# Patient Record
Sex: Male | Born: 1965 | Race: White | Hispanic: No | Marital: Married | Smoking: Never smoker
Health system: Southern US, Community
[De-identification: ages and names within clinical notes are randomized; demographics above are authoritative.]

## PROBLEM LIST (undated history)

## (undated) DIAGNOSIS — R002 Palpitations: Secondary | ICD-10-CM

## (undated) DIAGNOSIS — G4730 Sleep apnea, unspecified: Secondary | ICD-10-CM

## (undated) DIAGNOSIS — E785 Hyperlipidemia, unspecified: Secondary | ICD-10-CM

## (undated) DIAGNOSIS — F329 Major depressive disorder, single episode, unspecified: Secondary | ICD-10-CM

## (undated) DIAGNOSIS — T7840XA Allergy, unspecified, initial encounter: Secondary | ICD-10-CM

## (undated) DIAGNOSIS — N419 Inflammatory disease of prostate, unspecified: Secondary | ICD-10-CM

## (undated) DIAGNOSIS — F419 Anxiety disorder, unspecified: Secondary | ICD-10-CM

## (undated) DIAGNOSIS — F32A Depression, unspecified: Secondary | ICD-10-CM

## (undated) DIAGNOSIS — K219 Gastro-esophageal reflux disease without esophagitis: Secondary | ICD-10-CM

## (undated) HISTORY — DX: Major depressive disorder, single episode, unspecified: F32.9

## (undated) HISTORY — DX: Allergy, unspecified, initial encounter: T78.40XA

## (undated) HISTORY — DX: Depression, unspecified: F32.A

## (undated) HISTORY — PX: SHOULDER SURGERY: SHX246

## (undated) HISTORY — PX: SPINE SURGERY: SHX786

## (undated) HISTORY — PX: COLONOSCOPY: SHX174

## (undated) HISTORY — DX: Hyperlipidemia, unspecified: E78.5

## (undated) HISTORY — DX: Palpitations: R00.2

## (undated) HISTORY — DX: Gastro-esophageal reflux disease without esophagitis: K21.9

## (undated) HISTORY — PX: CERVICAL SPINE SURGERY: SHX589

## (undated) HISTORY — DX: Anxiety disorder, unspecified: F41.9

## (undated) HISTORY — PX: TONSILLECTOMY: SUR1361

## (undated) HISTORY — DX: Sleep apnea, unspecified: G47.30

---

## 2000-09-07 ENCOUNTER — Encounter: Payer: Self-pay | Admitting: Emergency Medicine

## 2000-09-07 ENCOUNTER — Emergency Department (HOSPITAL_COMMUNITY): Admission: EM | Admit: 2000-09-07 | Discharge: 2000-09-07 | Payer: Self-pay | Admitting: Emergency Medicine

## 2004-08-13 ENCOUNTER — Emergency Department (HOSPITAL_COMMUNITY): Admission: EM | Admit: 2004-08-13 | Discharge: 2004-08-13 | Payer: Self-pay | Admitting: Emergency Medicine

## 2007-04-25 ENCOUNTER — Ambulatory Visit (HOSPITAL_COMMUNITY): Admission: RE | Admit: 2007-04-25 | Discharge: 2007-04-25 | Payer: Self-pay | Admitting: Family Medicine

## 2008-02-04 ENCOUNTER — Other Ambulatory Visit: Admission: RE | Admit: 2008-02-04 | Discharge: 2008-02-04 | Payer: Self-pay | Admitting: Urology

## 2008-02-04 ENCOUNTER — Encounter (INDEPENDENT_AMBULATORY_CARE_PROVIDER_SITE_OTHER): Payer: Self-pay | Admitting: Urology

## 2008-02-08 ENCOUNTER — Ambulatory Visit (HOSPITAL_COMMUNITY): Admission: RE | Admit: 2008-02-08 | Discharge: 2008-02-08 | Payer: Self-pay | Admitting: Family Medicine

## 2008-07-17 ENCOUNTER — Ambulatory Visit: Payer: Self-pay | Admitting: Internal Medicine

## 2008-10-27 ENCOUNTER — Ambulatory Visit: Payer: Self-pay | Admitting: Internal Medicine

## 2008-10-27 ENCOUNTER — Ambulatory Visit (HOSPITAL_COMMUNITY): Admission: RE | Admit: 2008-10-27 | Discharge: 2008-10-27 | Payer: Self-pay | Admitting: Internal Medicine

## 2010-06-11 DIAGNOSIS — D229 Melanocytic nevi, unspecified: Secondary | ICD-10-CM

## 2010-06-11 HISTORY — DX: Melanocytic nevi, unspecified: D22.9

## 2011-02-02 ENCOUNTER — Ambulatory Visit (HOSPITAL_COMMUNITY): Payer: BC Managed Care – PPO | Attending: Family Medicine

## 2011-02-02 DIAGNOSIS — R079 Chest pain, unspecified: Secondary | ICD-10-CM | POA: Insufficient documentation

## 2011-03-15 NOTE — Consult Note (Signed)
NAME:  Francisco Pena, Francisco Pena                 ACCOUNT NO.:  [id number]   MEDICAL RECORD NO.:  [id number]          PATIENT TYPE:  AMB   LOCATION:  DAY                           FACILITY:  [facility]   PHYSICIAN:  R. Roetta Sessions, M.D. DATE OF BIRTH:  Jun 07, 1966   DATE OF CONSULTATION:  DATE OF DISCHARGE:                                 CONSULTATION   REQUESTING PHYSICIAN:  Donna Bernard, MD   REASON FOR CONSULTATION:  Hematochezia.   HISTORY OF PRESENT ILLNESS:  Francisco Pena is a 45 year old Caucasian male.  He is here for further evaluation of hematochezia.  He is also concerned  over the fact that his twin brother was just diagnosed with multiple  adenomatous colon polyps.  He tells me that over the last year, he has  had intermittent small-volume hematochezia.  He has noticed it after  defecation on the toilet paper a couple of days in a row intermittently  throughout the year.  He denies any abdominal pain, proctalgia, or  pruritus.  He denies any constipation or diarrhea.  He generally has a  soft brown bowel movement every day.  He occasionally has heartburn 2 or  3 days a week for which he takes p.r.n. Prilosec, this does seem to  help.  He denies any nausea, vomiting, dysphagia, odynophagia, anorexia,  weight loss, or early satiety.   PAST MEDICAL AND SURGICAL HISTORY:  Seasonal allergies, status post  tonsillectomy and adenoidectomy.   No known drug allergies.   Family history is positive for a twin brother with multiple adenomatous  polyps.  Mother age 59 with coronary artery disease.  Father committed  suicide at age 20.  He has 3 healthy siblings.   SOCIAL HISTORY:  Francisco Pena is married.  He has a healthy 3- and 7-year-  old.  He is a Runner, broadcasting/film/video at [employer].  He denies any tobacco,  alcohol, or drug use.   REVIEW OF SYSTEMS:  See HPI, otherwise negative.   PHYSICAL EXAMINATION:  VITAL SIGNS:  Weight 200 pounds, height 74  inches, temp 98.1, blood pressure 118/70, pulse  68.  GENERAL:  He is a well-developed, well-nourished Caucasian male in no  acute distress.  HEENT.  Sclerae clear, nonicteric.  Conjunctivae pick.  Oropharynx pink  and moist without any lesions.  NECK:  Supple without mass or thyromegaly.  CHEST:  Heart regular rate and rhythm.  Normal S1 and S2 without  murmurs, clicks, rubs. or gallops.  LUNGS:  Clear to auscultation bilaterally.  ABDOMEN:  Positive bowel sounds x4.  No bruits auscultated.  Soft,  nontender, nondistended without palpable mass or hepatosplenomegaly.  No  rebound, tenderness, or guarding.  EXTREMITIES:  Without clubbing or edema.  SKIN:  Pink, warm, and dry without any rash or jaundice.  RECTAL:  Deferred.   Laboratory studies from back in April 2009 at [facility]  Medicine were normal.  He did have a bilirubin of 1.9 and triglycerides  of 163.  He had a normal CBC and PSA.   IMPRESSION:  Francisco Pena is a 45 year old Caucasian male  with 1-year  history of intermittent small-volume hematochezia.  He also has a twin  brother with multiple adenomatous colon polyps.  He is going to require  further evaluation of his hematochezia to include benign anorectal  source such as hemorrhoids or fissure, diverticular bleeding, or less  likely ischemia, or colorectal polyps or carcinoma.   PLAN:  Colonoscopy with Dr. Jena Gauss in the near future.  Discussed  procedure including risks and benefits, which include, but not limited  to infection, perforation, drug reaction.  She agrees with the plan and  consent will be obtained.   Thank you Dr. Gerda Diss for allowing [place] to participate in the care of Mr.  Pena.      Lorenza Burton, N.P.      Jonathon Bellows, M.D.  Electronically Signed    KJ/MEDQ  D:  07/17/2008  T:  07/18/2008  Job:  [id number]   cc:   Donna Bernard, M.D.  Fax: [phone]

## 2011-03-15 NOTE — Op Note (Signed)
NAME:  WESTON, KALLMAN                 ACCOUNT NO.:  [id number]   MEDICAL RECORD NO.:  [id number]          PATIENT TYPE:  AMB   LOCATION:  DAY                           FACILITY:  [facility]   PHYSICIAN:  R. Roetta Sessions, M.D. DATE OF BIRTH:  1965/11/18   DATE OF PROCEDURE:  10/27/2008  DATE OF DISCHARGE:                               OPERATIVE REPORT   INDICATIONS FOR PROCEDURE:  A 45 year old Caucasian male with a twin  brother with colonic polyps.  He has had intermittent pain from  hematochezia for sometime.  No family history of colon cancer.  He has  never had his lower GI tract evaluated.  He has one bowel movement  daily.  Denies constipation or straining.  Colonoscopy is now being  done.  Risks, benefits, alternatives, and limitations have been  reviewed.  Questions answered, and he is agreeable.  Please see the  documentation in the medical record.   PROCEDURE NOTE:  O2 saturation, blood pressure, and pulse of the patient  monitored throughout the entire procedure.   CONSCIOUS SEDATION:  Versed 5 mg IV and Demerol 100 mg IV in divided  doses.   INSTRUMENT:  Pentax video chip system.   FINDINGS:  Digital rectal exam revealed a small external hemorrhoidal  tag.  Otherwise unremarkable.  Endoscopic findings:  The prep was good.  Colon:  Colonic mucosa was surveyed from the rectosigmoid junction  through the left transverse, right colon, appendiceal orifice, ileocecal  valve, and cecum.  These structures were well seen and photographed for  the record.  Terminal ileum was intubated to 10 cm.  From this level,  scope was slowly cautiously withdrawn.  All previously mentioned mucosal  surfaces were again seen.  The colonic mucosa appeared entirely normal  as did terminal ileal mucosa.  The scope was pulled down to the rectum  with examination of rectal mucosa including retroflexion of the anal  verge demonstrated only a single anal papilla.  The patient tolerated  the procedure well  and was reacted in Endoscopy.   IMPRESSION:  Small external hemorrhoidal tag, single anal papilla.  Otherwise, normal rectum, colon, terminal ileum.  I suspect the patient  has blood from hemorrhoids.   RECOMMENDATIONS:  1. Hemorrhoid.  High-fiber diet literature provided to Mr. Schaberg.  2. Ten-day course of Anusol-HC Suppository 1 per rectum at bedtime.  3. He should consider taking daily fiber supplement with Benefiber or      other agent.  4. Given family history of colonic polyps, he ought to return for a      repeat screening colonoscopy in 5 years.  5. If rectal bleeding is not resolved, he is to let us know.      Jonathon Bellows, M.D.  Electronically Signed     RMR/MEDQ  D:  10/27/2008  T:  10/27/2008  Job:  [id number]   cc:   Donna Bernard, M.D.  Fax: [phone]

## 2011-11-14 ENCOUNTER — Other Ambulatory Visit (HOSPITAL_COMMUNITY): Payer: Self-pay | Admitting: Neurological Surgery

## 2011-11-14 DIAGNOSIS — M545 Low back pain: Secondary | ICD-10-CM

## 2011-11-16 ENCOUNTER — Ambulatory Visit (HOSPITAL_COMMUNITY): Payer: BC Managed Care – PPO

## 2011-11-17 ENCOUNTER — Ambulatory Visit (HOSPITAL_COMMUNITY)
Admission: RE | Admit: 2011-11-17 | Discharge: 2011-11-17 | Disposition: A | Discharge status: Home / Self Care | Payer: BC Managed Care – PPO | Source: Ambulatory Visit | Attending: Neurological Surgery | Admitting: Neurological Surgery

## 2011-11-17 DIAGNOSIS — M545 Low back pain, unspecified: Secondary | ICD-10-CM | POA: Insufficient documentation

## 2011-11-17 DIAGNOSIS — M5126 Other intervertebral disc displacement, lumbar region: Secondary | ICD-10-CM | POA: Insufficient documentation

## 2012-11-27 ENCOUNTER — Other Ambulatory Visit: Payer: Self-pay | Admitting: Physician Assistant

## 2013-01-15 ENCOUNTER — Other Ambulatory Visit: Payer: Self-pay | Admitting: Family Medicine

## 2013-02-21 ENCOUNTER — Encounter (HOSPITAL_COMMUNITY): Payer: Self-pay | Admitting: *Deleted

## 2013-02-21 ENCOUNTER — Emergency Department (HOSPITAL_COMMUNITY)
Admission: EM | Admit: 2013-02-21 | Discharge: 2013-02-22 | Disposition: A | Discharge status: Home / Self Care | Payer: BC Managed Care – PPO | Attending: Emergency Medicine | Admitting: Emergency Medicine

## 2013-02-21 DIAGNOSIS — M545 Low back pain, unspecified: Secondary | ICD-10-CM | POA: Insufficient documentation

## 2013-02-21 DIAGNOSIS — R1110 Vomiting, unspecified: Secondary | ICD-10-CM | POA: Insufficient documentation

## 2013-02-21 DIAGNOSIS — Z87448 Personal history of other diseases of urinary system: Secondary | ICD-10-CM | POA: Insufficient documentation

## 2013-02-21 DIAGNOSIS — N200 Calculus of kidney: Secondary | ICD-10-CM | POA: Insufficient documentation

## 2013-02-21 HISTORY — DX: Inflammatory disease of prostate, unspecified: N41.9

## 2013-02-21 NOTE — ED Notes (Signed)
Visitor with pt walked out and stated that pt is in tremendous pain at this time. Pt is pacing in room at this time. Francisco Pena

## 2013-02-21 NOTE — ED Notes (Signed)
Pain rt side of low back Nausea,  Had pain last night and it went away, recurred app 1 hour ago.

## 2013-02-22 ENCOUNTER — Emergency Department (HOSPITAL_COMMUNITY): Payer: BC Managed Care – PPO

## 2013-02-22 LAB — URINE MICROSCOPIC-ADD ON

## 2013-02-22 LAB — CBC WITH DIFFERENTIAL/PLATELET
Basophils Absolute: 0 10*3/uL (ref 0.0–0.1)
HCT: 42.1 % (ref 39.0–52.0)
Lymphocytes Relative: 23 % (ref 12–46)
Neutro Abs: 6.6 10*3/uL (ref 1.7–7.7)
Platelets: 211 10*3/uL (ref 150–400)
RDW: 12.7 % (ref 11.5–15.5)
WBC: 9.7 10*3/uL (ref 4.0–10.5)

## 2013-02-22 LAB — BASIC METABOLIC PANEL
CO2: 29 mEq/L (ref 19–32)
Chloride: 101 mEq/L (ref 96–112)
Sodium: 142 mEq/L (ref 135–145)

## 2013-02-22 LAB — URINALYSIS, ROUTINE W REFLEX MICROSCOPIC
Glucose, UA: NEGATIVE mg/dL
Leukocytes, UA: NEGATIVE
Specific Gravity, Urine: >1.03 — ABNORMAL HIGH (ref 1.005–1.030)

## 2013-02-22 MED ORDER — SODIUM CHLORIDE 0.9 % IV BOLUS (SEPSIS)
1000.0000 mL | Freq: Once | INTRAVENOUS | Status: AC
  Administered 2013-02-22: 1000 mL via INTRAVENOUS

## 2013-02-22 MED ORDER — ONDANSETRON 4 MG PO TBDP: 4.0000 mg | ORAL_TABLET | Freq: Three times a day (TID) | ORAL | Status: DC | PRN

## 2013-02-22 MED ORDER — ONDANSETRON HCL 4 MG/2ML IJ SOLN
4.0000 mg | Freq: Once | INTRAMUSCULAR | Status: AC
  Administered 2013-02-22: 4 mg via INTRAVENOUS
  Filled 2013-02-22: qty 2

## 2013-02-22 MED ORDER — KETOROLAC TROMETHAMINE 30 MG/ML IJ SOLN
30.0000 mg | Freq: Once | INTRAMUSCULAR | Status: AC
  Administered 2013-02-22: 30 mg via INTRAVENOUS
  Filled 2013-02-22: qty 1

## 2013-02-22 MED ORDER — HYDROCODONE-ACETAMINOPHEN 5-325 MG PO TABS: 1.0000 | ORAL_TABLET | ORAL | Status: DC | PRN

## 2013-02-22 MED ORDER — CIPROFLOXACIN HCL 500 MG PO TABS: 500.0000 mg | ORAL_TABLET | Freq: Two times a day (BID) | ORAL | Status: DC

## 2013-02-22 MED ORDER — SODIUM CHLORIDE 0.9 % IV SOLN
Freq: Once | INTRAVENOUS | Status: AC
  Administered 2013-02-22: 02:00:00 via INTRAVENOUS

## 2013-02-22 MED ORDER — HYDROMORPHONE HCL PF 1 MG/ML IJ SOLN
1.0000 mg | Freq: Once | INTRAMUSCULAR | Status: AC
  Administered 2013-02-22: 1 mg via INTRAVENOUS
  Filled 2013-02-22: qty 1

## 2013-02-22 NOTE — ED Notes (Signed)
Gave patient drink as requested and MD approved.

## 2013-02-22 NOTE — ED Provider Notes (Signed)
History     CSN: [id number]  Arrival date & time 02/21/13  2233   First MD Initiated Contact with Patient 02/21/13 2350      Chief Complaint  Patient presents with  . Back Pain    (Consider location/radiation/quality/duration/timing/severity/associated sxs/prior treatment) HPI Francisco Pena is a 47 y.o. male who presents to the Emergency Department complaining of  Right flank pain with radiation to right lower abdomen that began an hour ago. Now worse. Unable to sit still. Vomiting x 1. Denies fever, chills. Has taken no medications.  PCP Dr. Gerda Diss  Past Medical History  Diagnosis Date  . Prostate infection     History reviewed. No pertinent past surgical history.  History reviewed. No pertinent family history.  History  Substance Use Topics  . Smoking status: Never Smoker   . Smokeless tobacco: Not on file  . Alcohol Use: No      Review of Systems  Constitutional: Negative for fever.       10 Systems reviewed and are negative for acute change except as noted in the HPI.  HENT: Negative for congestion.   Eyes: Negative for discharge and redness.  Respiratory: Negative for cough and shortness of breath.   Cardiovascular: Negative for chest pain.  Gastrointestinal: Positive for abdominal pain. Negative for vomiting.  Genitourinary: Positive for flank pain.  Musculoskeletal: Negative for back pain.  Skin: Negative for rash.  Neurological: Negative for syncope, numbness and headaches.  Psychiatric/Behavioral:       No behavior change.    Allergies  Review of patient's allergies indicates no known allergies.  Home Medications   Current Outpatient Rx  Name  Route  Sig  Dispense  Refill  . flintstones complete (FLINTSTONES) 60 MG chewable tablet   Oral   Chew 1 tablet by mouth every morning.         Marland Kitchen HYDROcodone-acetaminophen (NORCO/VICODIN) 5-325 MG per tablet   Oral   Take 1 tablet by mouth every 6 (six) hours as needed for pain (for back pain).        . ciprofloxacin (CIPRO) 500 MG tablet   Oral   Take 1 tablet (500 mg total) by mouth 2 (two) times daily.   14 tablet   0   . HYDROcodone-acetaminophen (NORCO/VICODIN) 5-325 MG per tablet   Oral   Take 1 tablet by mouth every 4 (four) hours as needed for pain.   15 tablet   0   . ondansetron (ZOFRAN ODT) 4 MG disintegrating tablet   Oral   Take 1 tablet (4 mg total) by mouth every 8 (eight) hours as needed for nausea.   20 tablet   0     BP 152/138  Pulse 80  Temp(Src) 97 F (36.1 C) (Oral)  Resp 20  Ht 6\' 2"  (1.88 m)  Wt 200 lb (90.719 kg)  BMI 25.67 kg/m2  SpO2 100%  Physical Exam  Nursing note and vitals reviewed. Constitutional: He appears well-developed and well-nourished.  Awake, alert, nontoxic appearance.  HENT:  Head: Normocephalic and atraumatic.  Right Ear: External ear normal.  Left Ear: External ear normal.  Mouth/Throat: Oropharynx is clear and moist.  Eyes: EOM are normal. Pupils are equal, round, and reactive to light.  Neck: Normal range of motion. Neck supple.  Cardiovascular: Normal rate and intact distal pulses.   Pulmonary/Chest: Effort normal and breath sounds normal. He exhibits no tenderness.  Abdominal: Soft. Bowel sounds are normal. There is no tenderness. There is no rebound.  Genitourinary:  Right cva tenderness  Musculoskeletal: He exhibits no tenderness.  Baseline ROM, no obvious new focal weakness.  Neurological:  Mental status and motor strength appears baseline for patient and situation.  Skin: No rash noted.  Psychiatric: He has a normal mood and affect.    ED Course  Procedures (including critical care time) Results for orders placed during the hospital encounter of 02/21/13  URINALYSIS, ROUTINE W REFLEX MICROSCOPIC      Result Value Range   Color, Urine YELLOW  YELLOW   APPearance CLEAR  CLEAR   Specific Gravity, Urine >1.030 (*) 1.005 - 1.030   pH 6.0  5.0 - 8.0   Glucose, UA NEGATIVE  NEGATIVE mg/dL   Hgb urine  dipstick MODERATE (*) NEGATIVE   Bilirubin Urine NEGATIVE  NEGATIVE   Ketones, ur NEGATIVE  NEGATIVE mg/dL   Protein, ur NEGATIVE  NEGATIVE mg/dL   Urobilinogen, UA 0.2  0.0 - 1.0 mg/dL   Nitrite NEGATIVE  NEGATIVE   Leukocytes, UA NEGATIVE  NEGATIVE  CBC WITH DIFFERENTIAL      Result Value Range   WBC 9.7  4.0 - 10.5 K/uL   RBC 5.02  4.22 - 5.81 MIL/uL   Hemoglobin 14.8  13.0 - 17.0 g/dL   HCT 16.1  09.6 - 04.5 %   MCV 83.9  78.0 - 100.0 fL   MCH 29.5  26.0 - 34.0 pg   MCHC 35.2  30.0 - 36.0 g/dL   RDW 40.9  81.1 - 91.4 %   Platelets 211  150 - 400 K/uL   Neutrophils Relative 68  43 - 77 %   Neutro Abs 6.6  1.7 - 7.7 K/uL   Lymphocytes Relative 23  12 - 46 %   Lymphs Abs 2.2  0.7 - 4.0 K/uL   Monocytes Relative 8  3 - 12 %   Monocytes Absolute 0.8  0.1 - 1.0 K/uL   Eosinophils Relative 2  0 - 5 %   Eosinophils Absolute 0.2  0.0 - 0.7 K/uL   Basophils Relative 0  0 - 1 %   Basophils Absolute 0.0  0.0 - 0.1 K/uL  BASIC METABOLIC PANEL      Result Value Range   Sodium 142  135 - 145 mEq/L   Potassium 3.5  3.5 - 5.1 mEq/L   Chloride 101  96 - 112 mEq/L   CO2 29  19 - 32 mEq/L   Glucose, Bld 124 (*) 70 - 99 mg/dL   BUN 20  6 - 23 mg/dL   Creatinine, Ser 7.82  0.50 - 1.35 mg/dL   Calcium 9.5  8.4 - 95.6 mg/dL   GFR calc non Af Amer 71 (*) >90 mL/min   GFR calc Af Amer 82 (*) >90 mL/min  URINE MICROSCOPIC-ADD ON      Result Value Range   WBC, UA 0-2  <3 WBC/hpf   RBC / HPF 3-6  <3 RBC/hpf   Crystals CA OXALATE CRYSTALS (*) NEGATIVE    Ct Abdomen Pelvis Wo Contrast  02/22/2013  *RADIOLOGY REPORT*  Clinical Data: Right flank pain  CT ABDOMEN AND PELVIS WITHOUT CONTRAST  Technique:  Multidetector CT imaging of the abdomen and pelvis was performed following the standard protocol without intravenous contrast.  Comparison: 11/17/2011 spine MRI  Findings: Limited images through the lung bases demonstrate no significant appreciable abnormality. The heart size is within normal  limits. No pleural or pericardial effusion.  Mild dependent atelectasis.  Organ abnormality/lesion detection is limited  in the absence of intravenous contrast. Within this limitation, unremarkable liver, biliary system, spleen, pancreas, adrenal glands, left kidney.  Right renal edema and perinephric fat stranding.  Mild hydroureteronephrosis to the level of a 3 mm stone at the right UVJ.  No CT evidence for colitis.  Normal appendix.  Small bowel loops are normal course and caliber.  No free intraperitoneal air or fluid.  No lymphadenopathy.  Normal caliber aorta and branch vessels with mild scattered atherosclerotic disease.  Decompressed bladder. Prostate upper normal.  No pelvic lymphadenopathy. Tiny fat containing right inguinal hernia versus spermatic cord lipoma.  No acute osseous finding.  IMPRESSION: Mild right hydroureteronephrosis to the level of a 3 mm UVJ stone.  Right renal perinephric fat stranding may reflect reactive changes, forniceal rupture, or superimposed infection. Correlate with urinalysis.   Original Report Authenticated By: Jearld Lesch, M.D.      1. Kidney stone    Medications  HYDROmorphone (DILAUDID) injection 1 mg (1 mg Intravenous Given 02/22/13 0008)  ondansetron (ZOFRAN) injection 4 mg (4 mg Intravenous Given 02/22/13 0007)  0.9 %  sodium chloride infusion ( Intravenous Stopped 02/22/13 0349)  sodium chloride 0.9 % bolus 1,000 mL (0 mLs Intravenous Stopped 02/22/13 0302)  ketorolac (TORADOL) 30 MG/ML injection 30 mg (30 mg Intravenous Given 02/22/13 0007)  HYDROmorphone (DILAUDID) injection 1 mg (1 mg Intravenous Given 02/22/13 0216)     MDM  Patient with right flank and abdominal pain. CT with 3 mm stone at UVJ. Given dilaudid x 2, zofran, and toradol with relief of the pain. Reviewed results with patient. Referral to urology. Pt stable in ED with no significant deterioration in condition.The patient appears reasonably screened and/or stabilized for discharge and I  doubt any other medical condition or other [facility] requiring further screening, evaluation, or treatment in the ED at this time prior to discharge.  MDM Reviewed: nursing note and vitals Interpretation: labs and CT scan           Nicoletta Dress. Colon Branch, MD 02/22/13 [phone]

## 2013-07-30 ENCOUNTER — Ambulatory Visit (INDEPENDENT_AMBULATORY_CARE_PROVIDER_SITE_OTHER): Payer: BC Managed Care – PPO | Admitting: Family Medicine

## 2013-07-30 ENCOUNTER — Encounter: Payer: Self-pay | Admitting: Family Medicine

## 2013-07-30 VITALS — BP 148/98 | Temp 98.4°F | Ht 74.0 in | Wt 212.4 lb

## 2013-07-30 DIAGNOSIS — J329 Chronic sinusitis, unspecified: Secondary | ICD-10-CM

## 2013-07-30 MED ORDER — AMOXICILLIN-POT CLAVULANATE 875-125 MG PO TABS: 1.0000 | ORAL_TABLET | Freq: Two times a day (BID) | ORAL | Status: AC

## 2013-07-30 NOTE — Progress Notes (Signed)
  Subjective:    Patient ID: Francisco Pena, male    DOB: 04-13-1966, 47 y.o.   MRN: [id number]  Sinusitis This is a new problem. The current episode started in the past 7 days. Associated symptoms include congestion, coughing, ear pain, headaches, sinus pressure, sneezing and a sore throat. Past treatments include acetaminophen. The treatment provided mild relief.   Sat woke up with ear ache and pressure, dragging on sat and sun  Lethargic, minimal energy  Max pressure in face  Ne g bp  Tried zyrtec, off brand tyl  Review of Systems  HENT: Positive for ear pain, congestion, sore throat, sneezing and sinus pressure.   Respiratory: Positive for cough.   Neurological: Positive for headaches.   ROS otherwise negative     Objective:   Physical Exam  Alert blood pressure on repeat 140/82 lungs clear. Heart regular in rhythm. H&T normal. Frontal maxillary tenderness and congestion     Assessment & Plan:  Impression 1 rhinosinusitis #2 borderline elevation blood pressure patient experiencing considerable stress. Plan Augmentin twice a day 10 days. Symptomatic care discussed. WSL

## 2013-10-01 ENCOUNTER — Encounter: Payer: Self-pay | Admitting: Internal Medicine

## 2013-10-30 ENCOUNTER — Encounter: Payer: Self-pay | Admitting: Family Medicine

## 2013-10-30 ENCOUNTER — Ambulatory Visit (INDEPENDENT_AMBULATORY_CARE_PROVIDER_SITE_OTHER): Payer: BC Managed Care – PPO | Admitting: Family Medicine

## 2013-10-30 VITALS — BP 118/74 | Temp 98.5°F | Ht 74.5 in | Wt 208.8 lb

## 2013-10-30 DIAGNOSIS — J329 Chronic sinusitis, unspecified: Secondary | ICD-10-CM

## 2013-10-30 MED ORDER — AMOXICILLIN-POT CLAVULANATE 875-125 MG PO TABS: 1.0000 | ORAL_TABLET | Freq: Two times a day (BID) | ORAL | Status: AC

## 2013-10-30 NOTE — Progress Notes (Signed)
   Subjective:    Patient ID: Francisco Pena, male    DOB: June 09, 1966, 47 y.o.   MRN: [id number]  Sinusitis This is a new problem. The current episode started 1 to 4 weeks ago. The problem has been waxing and waning since onset. There has been no fever. Associated symptoms include congestion, coughing, headaches and sinus pressure. Past treatments include acetaminophen and oral decongestants. The treatment provided mild relief.    Headache frontal in nature. Comes and goes. Radiates towards cheeks and ears. Off-and-on for several weeks.  Possible low-grade fever.  Somewhat diminished energy.  Review of Systems  HENT: Positive for congestion and sinus pressure.   Respiratory: Positive for cough.   Neurological: Positive for headaches.   No vomiting no diarrhea no rash ROS otherwise negative    Objective:   Physical Exam Alert hydration good. Frontal maxillary tenderness. Nasal congestion. Pharynx normal neck supple. Lungs clear heart regular in rhythm.       Assessment & Plan:   impression 1 rhinosinsitis-discussed plan antibiotics prescribed. Symptomatic care discussed. Warning signs discussed. WSL

## 2013-12-25 ENCOUNTER — Other Ambulatory Visit: Payer: Self-pay | Admitting: Family Medicine

## 2013-12-30 ENCOUNTER — Other Ambulatory Visit: Payer: Self-pay | Admitting: Family Medicine

## 2014-03-03 ENCOUNTER — Other Ambulatory Visit: Payer: Self-pay | Admitting: Family Medicine

## 2014-08-28 IMAGING — CT CT ABD-PELV W/O CM
2 of 3 series · 9 of 46 positions shown, 11 images · non-contrast
Comparison: 11/17/2011 spine MRI

CLINICAL DATA: Right flank pain

CT ABDOMEN AND PELVIS WITHOUT CONTRAST
TECHNIQUE: Multidetector CT imaging of the abdomen and pelvis was
performed following the standard protocol without intravenous
contrast.

[Series 4: mpr coronal (id) · coronal · 0.76mm/px · 8 of 102 slices shown, 9 images]
[im 12/102  soft-tissue]
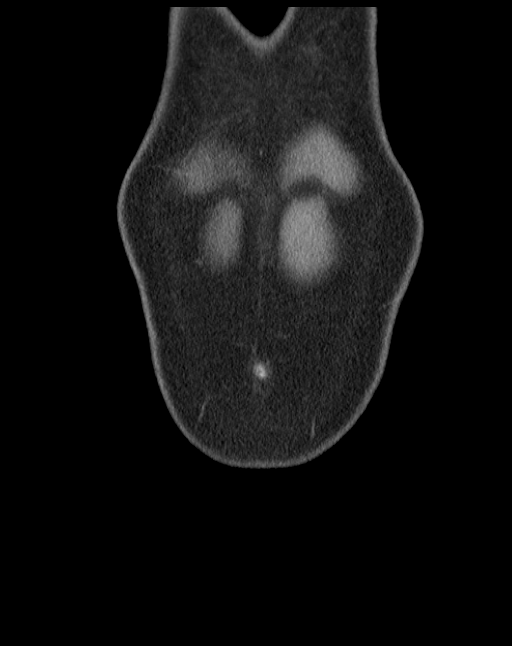
[im 12/102  bone]
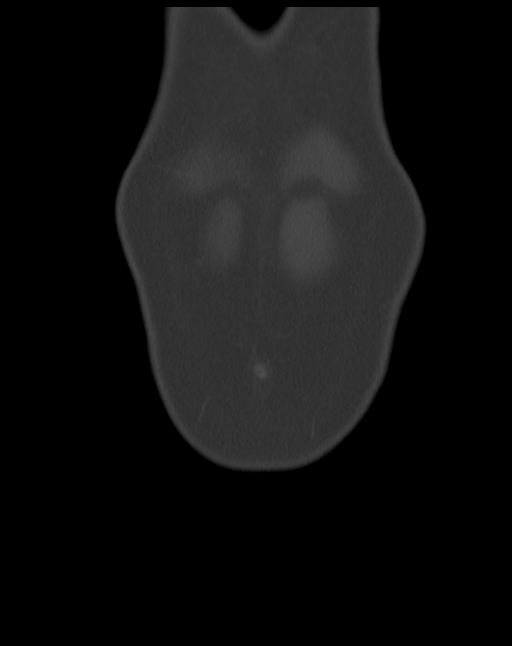
[im 23/102  soft-tissue]
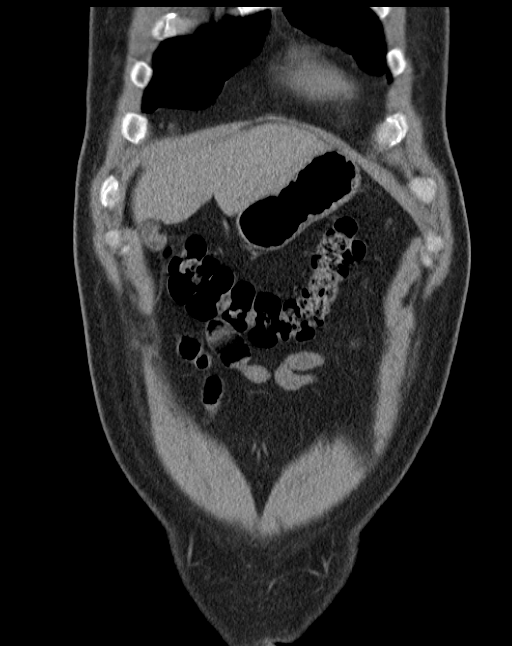
[im 34/102  soft-tissue]
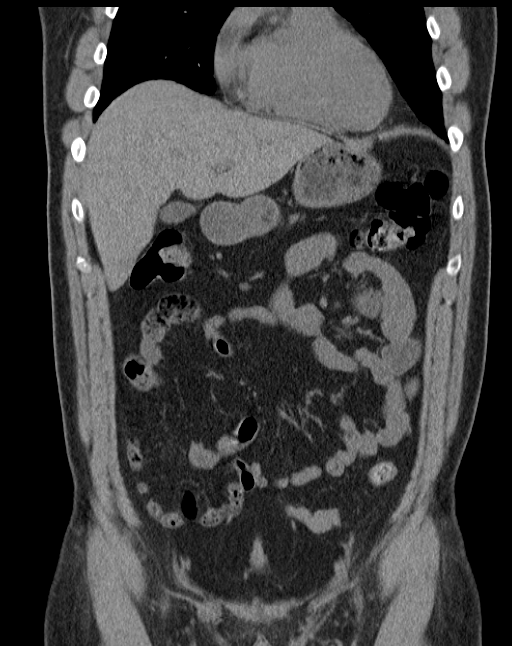
[im 45/102  soft-tissue]
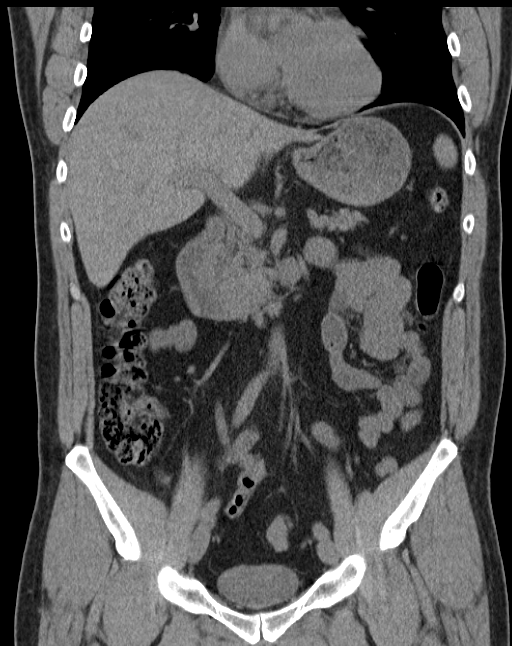
[im 57/102  soft-tissue]
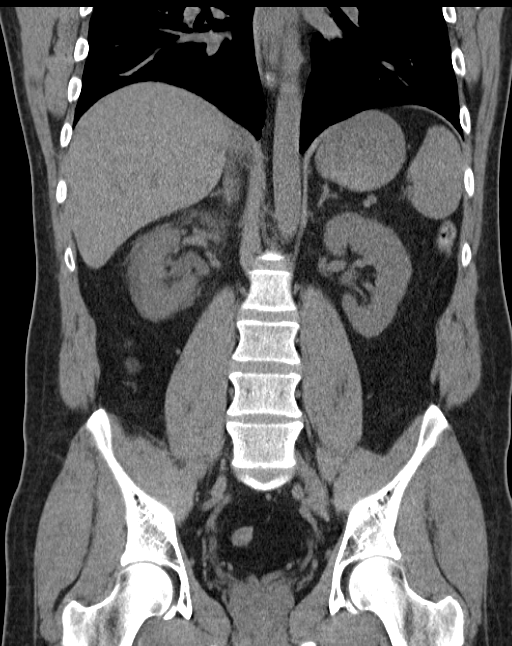
[im 68/102  soft-tissue]
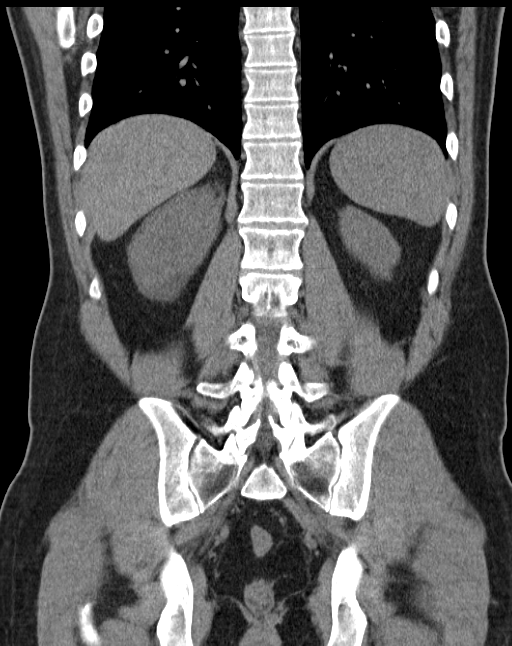
[im 79/102  soft-tissue]
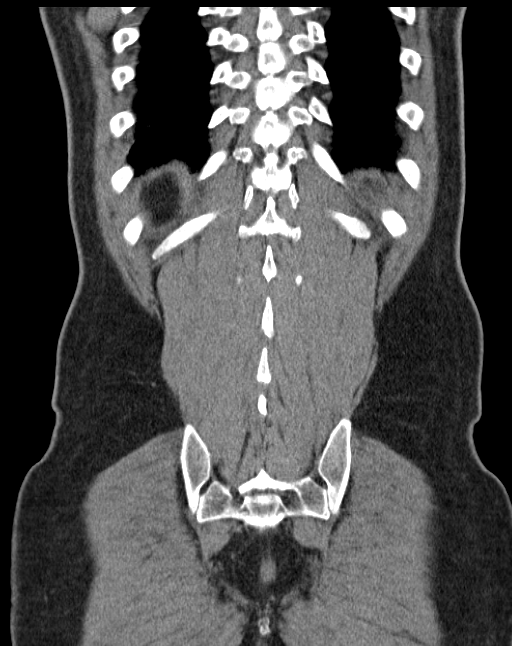
[im 90/102  soft-tissue]
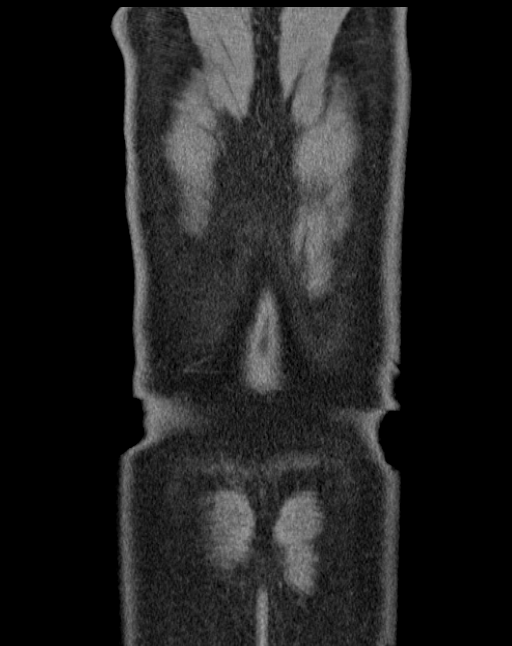

[Series 5: mpr sagittal (id) · sagittal · 0.65mm/px · 1 of 128 slices shown, 2 images]
[im 43/128  soft-tissue]
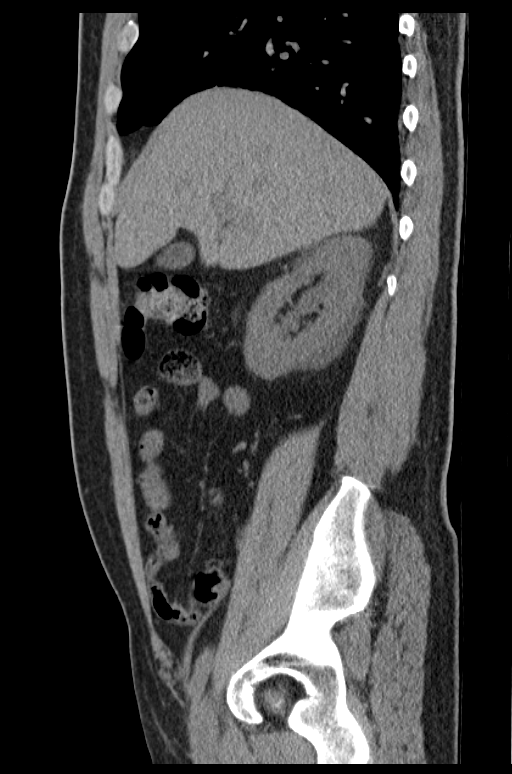
[im 43/128  bone]
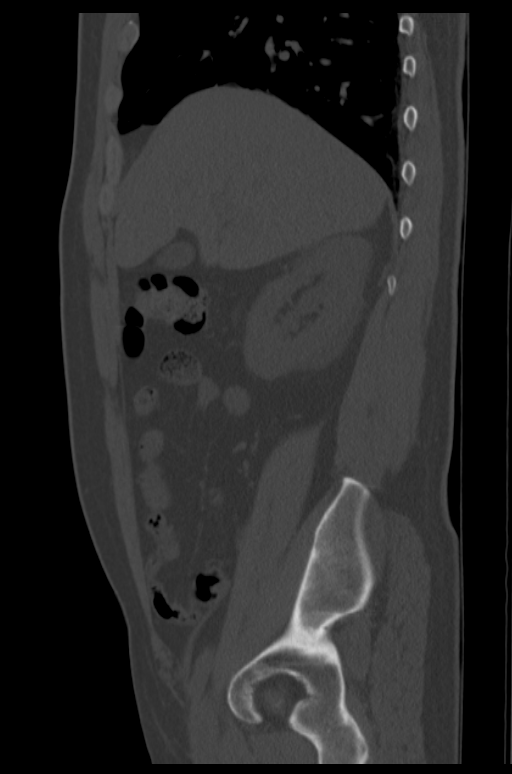

[9 of 46 positions shown; findings below may reference images not displayed]

FINDINGS: Limited images through the lung bases demonstrate no
significant appreciable abnormality. The heart size is within
normal limits. No pleural or pericardial effusion.  Mild dependent
atelectasis.

Organ abnormality/lesion detection is limited in the absence of
intravenous contrast. Within this limitation, unremarkable liver,
biliary system, spleen, pancreas, adrenal glands, left kidney.

Right renal edema and perinephric fat stranding.  Mild
hydroureteronephrosis to the level of a 3 mm stone at the right
UVJ.

No CT evidence for colitis.  Normal appendix.  Small bowel loops
are normal course and caliber.

No free intraperitoneal air or fluid.  No lymphadenopathy.

Normal caliber aorta and branch vessels with mild scattered
atherosclerotic disease.

Decompressed bladder. Prostate upper normal.  No pelvic
lymphadenopathy. Tiny fat containing right inguinal hernia versus
spermatic cord lipoma.

No acute osseous finding.
IMPRESSION: Mild right hydroureteronephrosis to the level of a 3 mm UVJ stone.

Right renal perinephric fat stranding may reflect reactive changes,
forniceal rupture, or superimposed infection. Correlate with
urinalysis.

## 2014-09-02 ENCOUNTER — Other Ambulatory Visit: Payer: Self-pay | Admitting: Family Medicine

## 2014-09-02 NOTE — Telephone Encounter (Signed)
Last 09/2013

## 2014-09-02 NOTE — Telephone Encounter (Signed)
One mo needs ov bef further

## 2014-11-01 ENCOUNTER — Other Ambulatory Visit: Payer: Self-pay | Admitting: Family Medicine

## 2014-11-03 NOTE — Telephone Encounter (Signed)
Last SEEN 09/2013

## 2014-11-03 NOTE — Telephone Encounter (Signed)
30 d only needs appt

## 2014-12-07 ENCOUNTER — Other Ambulatory Visit: Payer: Self-pay | Admitting: Family Medicine

## 2015-01-12 ENCOUNTER — Other Ambulatory Visit: Payer: Self-pay | Admitting: Family Medicine

## 2015-01-14 ENCOUNTER — Telehealth: Payer: Self-pay | Admitting: Family Medicine

## 2015-01-14 NOTE — Telephone Encounter (Signed)
Patient called and left message on my voice mail wanting records sent to a heart doctor in [place] a Dr. Harrington Challenger. But I looked in Epic havent been seen since 10/30/13 for Rhinosinusitis. Would he need to be seen here first so a referral can be done.

## 2015-01-14 NOTE — Telephone Encounter (Signed)
Does he want records only sent? In that case, do. If he wants referral to heart doc needs ov first

## 2015-01-14 NOTE — Telephone Encounter (Signed)
Francisco Pena, call pt and find out exactly what he wants and why, then go ahead and do it

## 2015-01-14 NOTE — Telephone Encounter (Signed)
What records needs to be sent from chart .He has no recent EKG.Please review chart and let me know what to send.

## 2015-01-20 ENCOUNTER — Encounter: Payer: Self-pay | Admitting: *Deleted

## 2015-01-24 ENCOUNTER — Other Ambulatory Visit: Payer: Self-pay | Admitting: Family Medicine

## 2015-02-02 ENCOUNTER — Ambulatory Visit (INDEPENDENT_AMBULATORY_CARE_PROVIDER_SITE_OTHER): Payer: BC Managed Care – PPO | Admitting: Internal Medicine

## 2015-02-02 ENCOUNTER — Encounter (INDEPENDENT_AMBULATORY_CARE_PROVIDER_SITE_OTHER): Payer: BC Managed Care – PPO

## 2015-02-02 ENCOUNTER — Encounter: Payer: Self-pay | Admitting: Internal Medicine

## 2015-02-02 ENCOUNTER — Encounter: Payer: Self-pay | Admitting: *Deleted

## 2015-02-02 VITALS — BP 102/72 | HR 85 | Ht 74.5 in | Wt 205.0 lb

## 2015-02-02 DIAGNOSIS — R002 Palpitations: Secondary | ICD-10-CM

## 2015-02-02 DIAGNOSIS — R0789 Other chest pain: Secondary | ICD-10-CM | POA: Diagnosis not present

## 2015-02-02 NOTE — Patient Instructions (Signed)
Your physician recommends that you continue on your current medications as directed. Please refer to the Current Medication list given to you today. Your physician has requested that you have an echocardiogram. Echocardiography is a painless test that uses sound waves to create images of your heart. It provides your doctor with information about the size and shape of your heart and how well your heart's chambers and valves are working. This procedure takes approximately one hour. There are no restrictions for this procedure.  Your physician has recommended that you wear an event monitor. Event monitors are medical devices that record the heart's electrical activity. Doctors most often [place] these monitors to diagnose arrhythmias. Arrhythmias are problems with the speed or rhythm of the heartbeat. The monitor is a small, portable device. You can wear one while you do your normal daily activities. This is usually used to diagnose what is causing palpitations/syncope (passing out).

## 2015-02-02 NOTE — Progress Notes (Signed)
Cardiology Office Note   Date:  02/02/2015   ID:  Francisco Pena, DOB 02/22/1966, MRN [id number]  PCP:  Rubbie Battiest, MD  Cardiologist:   Dorris Carnes, MD   Chief Complaint  Patient presents with  . Palpitations    Shortness of breath      History of Present Illness: Francisco Pena is a 49 y.o. male who is referred for palpitations.  Spells occur daily   The patient says that he has episodes where her feels his heart start to beat out of his chest.  Skpis.  Occur interimitt.  Some dizziness  Saturday had a spell that lasted 10 min  Associated with dizziness.  Does have some SOB with spells He notes occasional chest pains with and without activity.  This is occasionally pleuritic. Denies edema.   Family history signif for Dad and maternal GM with CHF  Mother and sister have afib    When not having spells he is fairly active  Teaches HS history         Current Outpatient Prescriptions  Medication Sig Dispense Refill  . citalopram (CELEXA) 40 MG tablet TAKE 1 TABLET BY MOUTH EVERY DAY 7 tablet 0  . HYDROcodone-acetaminophen (NORCO/VICODIN) 5-325 MG per tablet Take 1 tablet by mouth every 4 (four) hours as needed for pain. 15 tablet 0  . ondansetron (ZOFRAN ODT) 4 MG disintegrating tablet Take 1 tablet (4 mg total) by mouth every 8 (eight) hours as needed for nausea. 20 tablet 0   No current facility-administered medications for this visit.    Allergies:   Review of patient's allergies indicates no known allergies.   Past Medical History  Diagnosis Date  . Prostate infection   . Allergy     Past Surgical History  Procedure Laterality Date  . Tonsillectomy       Social History:  The patient  reports that he has never smoked. He does not have any smokeless tobacco history on file. He reports that he does not drink alcohol or use illicit drugs.   Family History:  The patient's family history includes Heart disease in his father; Hypertension in his father and mother.     ROS:  Please see the history of present illness. All other systems are reviewed and  Negative to the above problem except as noted.    PHYSICAL EXAM: VS:  BP 102/72 mmHg  Pulse 85  Ht 6' 2.5" (1.892 m)  Wt 205 lb (92.987 kg)  BMI 25.98 kg/m2  SpO2 97%  GEN: Well nourished, well developed, in no acute distress HEENT: normal Neck: no JVD, carotid bruits, or masses Cardiac: RRR; no murmurs, rubs, or gallops,no edema  Respiratory:  clear to auscultation bilaterally, normal work of breathing GI: soft, nontender, nondistended, + BS  No hepatomegaly  MS: no deformity Moving all extremities   Skin: warm and dry, no rash Neuro:  Strength and sensation are intact Psych: euthymic mood, full affect   EKG:  EKG is ordered today.  SR 75 bpm  Incomp RBBB.   Lipid Panel No results found for: CHOL, TRIG, HDL, CHOLHDL, VLDL, LDLCALC, LDLDIRECT    Wt Readings from Last 3 Encounters:  02/02/15 205 lb (92.987 kg)  10/30/13 208 lb 12.8 oz (94.711 kg)  07/30/13 212 lb 6.4 oz (96.344 kg)      ASSESSMENT AND PLAN:  1.  Palpitaitons.  Will set up for event monitor as well as echo (esp with family history) No other recomm for now.  2.  HCM  WIll check on lipids, other blood work from [employer]    Disposition:   FU with me will be based on test results.    Signed, Dorris Carnes, MD  02/02/2015 3:07 PM    [facility] [place], [place], [place]  [address] Phone: [phone]; Fax: [phone]

## 2015-02-02 NOTE — Progress Notes (Signed)
Patient ID: Francisco Pena, male   DOB: [birth year], 49 y.o.   MRN: [id number] [employer] Body Guardian 30 day cardiac event monitor applied to patient.

## 2015-02-03 ENCOUNTER — Telehealth: Payer: Self-pay | Admitting: Internal Medicine

## 2015-02-03 DIAGNOSIS — R0789 Other chest pain: Secondary | ICD-10-CM

## 2015-02-03 NOTE — Telephone Encounter (Signed)
New message      Someone from this office called yesterday requesting recent labs.  Pt has not had any lab work drawn at this office in years.

## 2015-02-04 NOTE — Telephone Encounter (Signed)
Would like to get fasting lipids

## 2015-02-05 ENCOUNTER — Ambulatory Visit (HOSPITAL_COMMUNITY): Payer: BC Managed Care – PPO | Attending: Cardiology | Admitting: Radiology

## 2015-02-05 DIAGNOSIS — R079 Chest pain, unspecified: Secondary | ICD-10-CM | POA: Diagnosis not present

## 2015-02-05 DIAGNOSIS — R0789 Other chest pain: Secondary | ICD-10-CM

## 2015-02-05 DIAGNOSIS — R002 Palpitations: Secondary | ICD-10-CM | POA: Insufficient documentation

## 2015-02-05 DIAGNOSIS — I071 Rheumatic tricuspid insufficiency: Secondary | ICD-10-CM | POA: Diagnosis not present

## 2015-02-05 NOTE — Progress Notes (Signed)
Echocardiogram performed.  

## 2015-02-05 NOTE — Telephone Encounter (Signed)
Spoke with patient. Order placed for lipids. Adv of lab hours. He is unable to schedule today but will make appointment and come back for them. Coming today for echo but is not fasting.

## 2015-02-12 ENCOUNTER — Other Ambulatory Visit (INDEPENDENT_AMBULATORY_CARE_PROVIDER_SITE_OTHER): Payer: BC Managed Care – PPO | Admitting: *Deleted

## 2015-02-12 DIAGNOSIS — R0789 Other chest pain: Secondary | ICD-10-CM | POA: Diagnosis not present

## 2015-02-12 LAB — LDL CHOLESTEROL, DIRECT: LDL DIRECT: 111 mg/dL

## 2015-02-12 LAB — LIPID PANEL
CHOL/HDL RATIO: 5
CHOLESTEROL: 196 mg/dL (ref 0–200)
HDL: 37.9 mg/dL — ABNORMAL LOW (ref >39.00)
NonHDL: 158.1
TRIGLYCERIDES: 282 mg/dL — AB (ref 0.0–149.0)
VLDL: 56.4 mg/dL — ABNORMAL HIGH (ref 0.0–40.0)

## 2015-02-13 ENCOUNTER — Telehealth: Payer: Self-pay | Admitting: Internal Medicine

## 2015-02-13 NOTE — Telephone Encounter (Signed)
New problem   Pt want to know results of his labs he had done yesterday 4.14.16.

## 2015-02-16 NOTE — Telephone Encounter (Signed)
New Message  Pt wanted to speak w/ Rn about lab results, and also go over monitor event. Pt was notified of an event last night from Monitor team. Pt wanted to speak w/ Rn about what to do going forward. Please call back and discuss.

## 2015-02-16 NOTE — Telephone Encounter (Signed)
Calling stating he received call from the monitor team last PM and wanted to know if recording was ok and what he needed to do.  Also would like lab results.  Advised we did receive a recording and the DOD signed and wrote no changes.  Advised for him to continue to wear monitor.  Advised that his lab results had not been reviewed by Dr. Harrington Challenger and once she has reviewed them someone would call him.

## 2015-02-19 ENCOUNTER — Other Ambulatory Visit: Payer: Self-pay | Admitting: Family Medicine

## 2015-02-19 ENCOUNTER — Other Ambulatory Visit: Payer: Self-pay | Admitting: *Deleted

## 2015-02-19 ENCOUNTER — Telehealth: Payer: Self-pay | Admitting: Internal Medicine

## 2015-02-19 MED ORDER — CITALOPRAM HYDROBROMIDE 40 MG PO TABS: 40.0000 mg | ORAL_TABLET | Freq: Every day | ORAL | Status: DC

## 2015-02-19 NOTE — Telephone Encounter (Signed)
Pt called for lab results. Preliminary results given; pt is aware that Dr. Harrington Challenger need to review and make recommendations. Pt would like to be called tomorrow with MD's recommendations.

## 2015-02-19 NOTE — Telephone Encounter (Signed)
New message  ° ° °Patient calling for test results.   °

## 2015-02-23 ENCOUNTER — Telehealth: Payer: Self-pay | Admitting: Internal Medicine

## 2015-02-23 NOTE — Telephone Encounter (Signed)
New message      Pt has some health issues he would like to talk about

## 2015-02-23 NOTE — Telephone Encounter (Signed)
Notes Recorded by [name], MD on 02/21/2015 at 9:38 PM Lipid panel: LDL is OK 111 Triglycerides are increased Watch / cut back on carbs.   PT INFORMED.

## 2015-02-24 ENCOUNTER — Encounter: Payer: Self-pay | Admitting: Family Medicine

## 2015-02-24 ENCOUNTER — Ambulatory Visit (INDEPENDENT_AMBULATORY_CARE_PROVIDER_SITE_OTHER): Payer: BC Managed Care – PPO | Admitting: Family Medicine

## 2015-02-24 VITALS — BP 116/88 | Ht 74.5 in | Wt 217.8 lb

## 2015-02-24 DIAGNOSIS — E785 Hyperlipidemia, unspecified: Secondary | ICD-10-CM | POA: Diagnosis not present

## 2015-02-24 DIAGNOSIS — F19930 Other psychoactive substance use, unspecified with withdrawal, uncomplicated: Secondary | ICD-10-CM | POA: Diagnosis not present

## 2015-02-24 DIAGNOSIS — E781 Pure hyperglyceridemia: Secondary | ICD-10-CM

## 2015-02-24 DIAGNOSIS — F411 Generalized anxiety disorder: Secondary | ICD-10-CM

## 2015-02-24 NOTE — Progress Notes (Signed)
   Subjective:    Patient ID: Francisco Pena, male    DOB: 27-Feb-1966, 49 y.o.   MRN: [id number] Patient arrives office with several concerns.  Was on Celexa 40 mg daily. Stopped it abruptly one week ago.    Dizziness This is a new problem. The current episode started in the past 7 days. The problem occurs intermittently. The problem has been unchanged. Associated symptoms include headaches. The symptoms are aggravated by standing, twisting, bending and exertion. He has tried lying down and rest for the symptoms. The treatment provided no relief.   Patient also reports that he has a 30-day cardiac monitor since the first week of April.   Felt short of breath and rapid h r at times  Did echo with 30   Dizzy with  Sudden motion of head dizzy few moments   Little headache not much allergies some, but not   Felt blah Patient concerned about lipids. Was told cholesterol was high. Numbers are reviewed with him today.  Reports ongoing stress. States she definitely needs the Celexa. Would like to maintain.  Review of Systems  Neurological: Positive for dizziness and headaches.   No abdominal pain no chest pain no change in bowel habits no loss of consciousness    Objective:   Physical Exam Alert vitals stable HET normal neck supple lungs clear. Heart regular in rhythm. Neuro exam intact.       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 3 hyperlipidemia discussed at length plan Celexa refilled. Lipid lowering measures discussed. Diet exercise discussed. 25 minutes spent most in discussion WSL

## 2015-02-26 NOTE — Telephone Encounter (Signed)
Patient saw family medicine doctor and states that thinks are good now. He discontinued the monitor.  States was told to wear until 5/4 and "its close enough to the 4th" so he will mail it back. Advised we will contact him with Dr. Harrington Challenger recommendation after the report is received in our office and Dr. Harrington Challenger reviews it. Pt verbalizes understanding and appreciation for the return call.

## 2015-02-27 ENCOUNTER — Other Ambulatory Visit: Payer: Self-pay | Admitting: *Deleted

## 2015-02-27 MED ORDER — CITALOPRAM HYDROBROMIDE 40 MG PO TABS: 40.0000 mg | ORAL_TABLET | Freq: Every day | ORAL | Status: DC

## 2015-03-11 ENCOUNTER — Telehealth: Payer: Self-pay | Admitting: Internal Medicine

## 2015-03-11 MED ORDER — METOPROLOL SUCCINATE ER 25 MG PO TB24: ORAL_TABLET | ORAL | Status: DC

## 2015-03-11 NOTE — Telephone Encounter (Signed)
Follow up      Returning Francisco Pena's call

## 2015-03-11 NOTE — Telephone Encounter (Signed)
Follow Up  ° °Pt is calling regarding test results. Please call. °

## 2015-03-11 NOTE — Telephone Encounter (Signed)
Calling requesting results of monitor.  States he has had an issue with no one  calling regarding labs and monitor results unless he initiates the call. Gave him the monitor results.  He states he has the palpitations a lot but mostly at night.  The suggestion on monitor results was to try Toprol XL 12.5 mg daily. He wants to try the medication so will send into [facility].  Advised if that doesn't help with the palpitations to call back.

## 2015-03-11 NOTE — Telephone Encounter (Signed)
lmtcb

## 2015-09-18 ENCOUNTER — Other Ambulatory Visit: Payer: Self-pay | Admitting: Family Medicine

## 2015-09-18 NOTE — Telephone Encounter (Signed)
Ok times one, o v before further 

## 2015-12-26 ENCOUNTER — Other Ambulatory Visit: Payer: Self-pay | Admitting: Family Medicine

## 2015-12-28 NOTE — Telephone Encounter (Signed)
S lastpril adivised t o f u in six mo, may give sixty days worth, ov before completedeen last a

## 2016-02-28 ENCOUNTER — Other Ambulatory Visit: Payer: Self-pay | Admitting: Family Medicine

## 2016-03-27 ENCOUNTER — Other Ambulatory Visit: Payer: Self-pay | Admitting: Family Medicine

## 2016-03-29 NOTE — Telephone Encounter (Signed)
not seen in over 1 year

## 2016-03-29 NOTE — Telephone Encounter (Signed)
shannonn did pt sched appt?

## 2016-03-29 NOTE — Telephone Encounter (Signed)
Call pt, not seen over one yr, willing to call in thirty d if pt sche d an appt

## 2016-04-15 ENCOUNTER — Encounter: Payer: Self-pay | Admitting: Family Medicine

## 2016-04-15 ENCOUNTER — Ambulatory Visit (INDEPENDENT_AMBULATORY_CARE_PROVIDER_SITE_OTHER): Payer: BC Managed Care – PPO | Admitting: Family Medicine

## 2016-04-15 VITALS — BP 122/80 | Ht 74.5 in | Wt 217.0 lb

## 2016-04-15 DIAGNOSIS — F411 Generalized anxiety disorder: Secondary | ICD-10-CM

## 2016-04-15 MED ORDER — CITALOPRAM HYDROBROMIDE 40 MG PO TABS: ORAL_TABLET | ORAL | Status: DC

## 2016-04-15 MED ORDER — AMOXICILLIN-POT CLAVULANATE 875-125 MG PO TABS: 1.0000 | ORAL_TABLET | Freq: Two times a day (BID) | ORAL | Status: AC

## 2016-04-15 NOTE — Progress Notes (Signed)
   Subjective:    Patient ID: ADRAN Pena, male    DOB: 04/20/1966, 50 y.o.   MRN: [id number]  Anxiety Presents for follow-up visit. The severity of symptoms is mild. The quality of sleep is fair. Nighttime awakenings: none.   Compliance with medications is 76-100%.   Patient has no concerns at this time.   on claritin d and fo   Hurt back and now getting MRI from low back all the wy down to tight sciatica  workin gon diet tying to cover mltakes the celexza reg  Sometime s wonder s if needs    Review of Systems No headache, no major weight loss or weight gain, no chest pain no back pain abdominal pain no change in bowel habits complete ROS otherwise negative     Objective:   Physical Exam Patient achieves good control of his chronic anxiety and element of depression with his current medicine neck  Alert vitals stable HEENT normal lungs clear heart regular in rhythm neuro exam intact       Assessment & Plan:  Impression see above first [place] physical exam plan Celexa refilled diet exercise discussed. Patient encouraged to get his own full-time primary care clinician at his new home

## 2016-04-17 ENCOUNTER — Other Ambulatory Visit: Payer: Self-pay | Admitting: Internal Medicine

## 2016-04-26 ENCOUNTER — Telehealth: Payer: Self-pay | Admitting: Family Medicine

## 2016-04-26 NOTE — Telephone Encounter (Signed)
Pt with questions about his recent MRI results - pt thinks MRI was done 04/22/16 @ [facility] ([facility] (this was ordered by a doctor at [facility])  States that he had the results forwarded to Dr. Richardson Landry   Pt was told that ordering physician is referring him to a neurosurgeon, states he discussed symptoms with Dr. 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 Jeannine Kitten opinion

## 2016-04-29 NOTE — Telephone Encounter (Signed)
i see no such results in the system anywhere. Pt only briefly mentioned during our visit together, and i did no exam or eval or formulated any opinion., he is now seeing another prinmary care practice they odered the tests and have made the referral. If pt wishes to get a copy of this by hand and come in for a face to face discussion, I'll do. He really needs to settle on one primarycare source (I advised him of such at last visit)

## 2016-04-29 NOTE — Telephone Encounter (Signed)
Spoke with patient and informed him per Dr.Steve Luking- see no such results in the system anywhere. only briefly mentioned during our visit together, and Dr.Steve did no exam or eval or formulated any opinion., he is now seeing another prinmary care practice they odered the tests and have made the referral. If  wishes to get a copy of this by hand and come in for a face to face discussion, Dr.Steve will do. He really needs to settle on one primarycare source. Patient verbalized understanding.

## 2016-05-19 DIAGNOSIS — M5126 Other intervertebral disc displacement, lumbar region: Secondary | ICD-10-CM | POA: Insufficient documentation

## 2016-05-24 ENCOUNTER — Other Ambulatory Visit: Payer: Self-pay | Admitting: *Deleted

## 2016-05-24 MED ORDER — METOPROLOL SUCCINATE ER 25 MG PO TB24: 12.5000 mg | ORAL_TABLET | Freq: Every day | ORAL | 2 refills | Status: DC

## 2016-05-25 ENCOUNTER — Other Ambulatory Visit: Payer: Self-pay | Admitting: Internal Medicine

## 2016-05-25 NOTE — Telephone Encounter (Signed)
metoprolol succinate (TOPROL-XL) 25 MG 24 hr tablet  Medication  Date: 05/24/2016 Department: [address] Office Ordering/Authorizing: [name], MD  Order Providers   Prescribing Provider Encounter Provider  [name], MD Dannette Barbara, CMA  Medication Detail    Disp Refills Start End   metoprolol succinate (TOPROL-XL) 25 MG 24 hr tablet 15 tablet 2 05/24/2016    Sig - Route: Take 0.5 tablets (12.5 mg total) by mouth daily. Please call and schedule an appointment - Oral   Notes to Pharmacy: Please call to schedule appt for further refills. [id number]. Attempt 1.   E-Prescribing Status: Receipt confirmed by pharmacy (05/24/2016 4:53 PM EDT)   Pharmacy   [id number] [place], [place] [address]

## 2016-06-29 ENCOUNTER — Other Ambulatory Visit: Payer: Self-pay | Admitting: Internal Medicine

## 2016-06-29 MED ORDER — METOPROLOL SUCCINATE ER 25 MG PO TB24: 12.5000 mg | ORAL_TABLET | Freq: Every day | ORAL | 0 refills | Status: DC

## 2016-08-01 ENCOUNTER — Other Ambulatory Visit (HOSPITAL_COMMUNITY): Payer: Self-pay | Admitting: *Deleted

## 2016-08-01 MED ORDER — METOPROLOL SUCCINATE ER 25 MG PO TB24: 12.5000 mg | ORAL_TABLET | Freq: Every day | ORAL | 0 refills | Status: DC

## 2016-08-16 ENCOUNTER — Encounter: Payer: Self-pay | Admitting: Physician Assistant

## 2016-08-29 ENCOUNTER — Ambulatory Visit: Payer: BC Managed Care – PPO | Admitting: Physician Assistant

## 2016-08-29 ENCOUNTER — Ambulatory Visit (INDEPENDENT_AMBULATORY_CARE_PROVIDER_SITE_OTHER): Payer: BC Managed Care – PPO | Admitting: Physician Assistant

## 2016-08-29 ENCOUNTER — Encounter: Payer: Self-pay | Admitting: Physician Assistant

## 2016-08-29 VITALS — BP 120/70 | HR 82 | Ht 74.0 in | Wt 220.8 lb

## 2016-08-29 DIAGNOSIS — R002 Palpitations: Secondary | ICD-10-CM | POA: Diagnosis not present

## 2016-08-29 MED ORDER — METOPROLOL SUCCINATE ER 25 MG PO TB24: 12.5000 mg | ORAL_TABLET | Freq: Every day | ORAL | 6 refills | Status: DC

## 2016-08-29 NOTE — Patient Instructions (Addendum)
Medication Instructions:  Your physician recommends that you continue on your current medications as directed. Please refer to the Current Medication list given to you today.  Labwork: none  Testing/Procedures: none  Follow-Up: Your physician wants you to follow-up in: 1 year with DR. Harrington Challenger. You will receive a reminder letter in the mail two months in advance. If you don't receive a letter, please call our office to schedule the follow-up appointment.  Any Other Special Instructions Will Be Listed Below (If Applicable).  If you need a refill on your cardiac medications before your next appointment, please call your pharmacy.

## 2016-08-29 NOTE — Progress Notes (Signed)
Cardiology Office Note:    Date:  08/29/2016   ID:  Francisco Pena, DOB 1966/10/23, MRN [id number]  PCP:  Mickie Hillier, MD  Cardiologist:  Dr. Dorris Carnes  Electrophysiologist:  n/a  Referring MD: Mikey Kirschner, MD   Chief Complaint  Patient presents with  . Follow-up    palpitations    History of Present Illness:    Francisco Pena is a 50 y.o. male with a hx of anxiety, HL.  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 He was evaluated by Dr. Harrington Challenger in 4/16 with palpitations and chest discomfort. Echocardiogram demonstrated normal LV function. Event monitor demonstrated normal sinus rhythm and occasional PACs. Patient was placed on Toprol-XL 12.5 mg daily for control of symptoms.  He returns for FU.  He needs his Rx refilled. He has done very well on low dose beta-blocker. He denies recurrent palpitations.  The patient denies chest pain, shortness of breath, syncope, orthopnea, PND or significant pedal edema.  He had back surgery earlier this year (L4-5; Dr. Ellene Route).  He is slowly increasing his activity.   Prior CV studies that were reviewed today include:    Echo 02/05/15 EF 55-60, normal wall motion, normal diastolic function, mild TR  Event monitor 4/16 NSR with occasional PACs  Past Medical History:  Diagnosis Date  . Allergy   . Prostate infection     Past Surgical History:  Procedure Laterality Date  . TONSILLECTOMY      Current Medications: Current Meds  Medication Sig  . atorvastatin (LIPITOR) 20 MG tablet Take 20 mg by mouth daily at 6 PM.   . citalopram (CELEXA) 40 MG tablet Take 40 mg by mouth daily. TAKE ONE HALF TABLET BY MOUTH DAILY  . metoprolol succinate (TOPROL-XL) 25 MG 24 hr tablet Take 0.5 tablets (12.5 mg total) by mouth daily.     Allergies:   Review of patient's allergies indicates no known allergies.   Social History   Social History  . Marital status: Married    Spouse name: N/A  . Number of children: N/A  . Years of education: N/A    Social History Main Topics  . Smoking status: Never Smoker  . Smokeless tobacco: Never Used  . Alcohol use No  . Drug use: No  . Sexual activity: Yes    Partners: Female   Other Topics Concern  . 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     Family History:  The patient's family history includes Heart disease in his father; Hypertension in his father and mother.   ROS:   Please see the history of present illness.    ROS All other systems reviewed and are negative.   EKGs/Labs/Other Test Reviewed:    EKG:  EKG is  ordered today.  The ekg ordered today demonstrates NSR, HR 82, normal axis, NSSTTW changes, RSR' V1-3 (inc RBBB), QTc 434 ms.  Recent Labs: No results found for requested labs within last 8760 hours.   Recent Lipid Panel    Component Value Date/Time   CHOL 196 02/12/2015 0738   TRIG 282.0 (H) 02/12/2015 0738   HDL 37.90 (L) 02/12/2015 0738   CHOLHDL 5 02/12/2015 0738   VLDL 56.4 (H) 02/12/2015 0738   LDLDIRECT 111.0 02/12/2015 0738     Physical Exam:    VS:  BP 120/70  Pulse 82   Ht 6\' 2"  (1.88 m)   Wt 220 lb 12.8 oz (100.2 kg)   BMI 28.35 kg/m     Wt Readings from Last 3 Encounters:  08/29/16 220 lb 12.8 oz (100.2 kg)  04/15/16 217 lb (98.4 kg)  02/24/15 217 lb 12.8 oz (98.8 kg)     Physical Exam  Constitutional: He is oriented to person, place, and time. He appears well-developed and well-nourished. No distress.  HENT:  Head: Normocephalic and atraumatic.  Eyes: No scleral icterus.  Neck: No JVD present.  Cardiovascular: Normal rate, regular rhythm and normal heart sounds.   No murmur heard. Pulmonary/Chest: Effort normal. He has no wheezes. He has no rales.  Abdominal: Soft. There is no tenderness.  Musculoskeletal: He exhibits no edema.  Neurological: He is alert and oriented to person, place, and time.  Skin: Skin is  warm and dry.  Psychiatric: He has a normal mood and affect.    ASSESSMENT:    1. Palpitations    PLAN:    In order of problems listed above:  1. Palpitations - Hx of normal Echo in 2016 and event monitor with occasional PACs.  His symptoms have been well controlled on beta-blocker therapy and he prefers to continue.  Continue current medical regimen.  FU here 1 year or as needed.  If he desires, he can get his PCP to fill his beta-blocker for him and FU here prn.     Medication Adjustments/Labs and Tests Ordered: Current medicines are reviewed at length with the patient today.  Concerns regarding medicines are outlined above.  Medication changes, Labs and Tests ordered today are outlined in the Patient Instructions noted below. Patient Instructions  Medication Instructions:  Your physician recommends that you continue on your current medications as directed. Please refer to the Current Medication list given to you today.  Labwork: none  Testing/Procedures: none  Follow-Up: Your physician wants you to follow-up in: 1 year with DR. Harrington Challenger. You will receive a reminder letter in the mail two months in advance. If you don't receive a letter, please call our office to schedule the follow-up appointment.  Any Other Special Instructions Will Be Listed Below (If Applicable).  If you need a refill on your cardiac medications before your next appointment, please call your pharmacy.  Signed, Richardson Dopp, PA-C  08/29/2016 9:49 AM    [facility] [place], [place], [place]  [address] Phone: [phone]; Fax: [phone]

## 2016-09-09 ENCOUNTER — Ambulatory Visit: Payer: BC Managed Care – PPO | Admitting: Physician Assistant

## 2016-09-09 ENCOUNTER — Ambulatory Visit (HOSPITAL_COMMUNITY): Payer: BC Managed Care – PPO | Admitting: Nurse Practitioner

## 2016-11-01 ENCOUNTER — Ambulatory Visit: Payer: BC Managed Care – PPO | Admitting: Physician Assistant

## 2016-11-14 ENCOUNTER — Encounter: Payer: Self-pay | Admitting: Physician Assistant

## 2016-11-14 ENCOUNTER — Ambulatory Visit (INDEPENDENT_AMBULATORY_CARE_PROVIDER_SITE_OTHER): Payer: BC Managed Care – PPO | Admitting: Physician Assistant

## 2016-11-14 VITALS — BP 126/84 | HR 84 | Temp 98.5°F | Resp 16 | Ht 74.0 in | Wt 223.0 lb

## 2016-11-14 DIAGNOSIS — E785 Hyperlipidemia, unspecified: Secondary | ICD-10-CM

## 2016-11-14 DIAGNOSIS — F411 Generalized anxiety disorder: Secondary | ICD-10-CM | POA: Diagnosis not present

## 2016-11-14 DIAGNOSIS — J069 Acute upper respiratory infection, unspecified: Secondary | ICD-10-CM

## 2016-11-14 DIAGNOSIS — G2581 Restless legs syndrome: Secondary | ICD-10-CM

## 2016-11-14 DIAGNOSIS — B9789 Other viral agents as the cause of diseases classified elsewhere: Secondary | ICD-10-CM

## 2016-11-14 LAB — COMPREHENSIVE METABOLIC PANEL
ALK PHOS: 71 U/L (ref 39–117)
ALT: 30 U/L (ref 0–53)
AST: 23 U/L (ref 0–37)
Albumin: 4.5 g/dL (ref 3.5–5.2)
BUN: 16 mg/dL (ref 6–23)
CALCIUM: 9.6 mg/dL (ref 8.4–10.5)
CHLORIDE: 104 meq/L (ref 96–112)
CO2: 30 mEq/L (ref 19–32)
CREATININE: 1.07 mg/dL (ref 0.40–1.50)
GFR: 78.25 mL/min (ref >60.00)
Glucose, Bld: 89 mg/dL (ref 70–99)
Potassium: 4.4 mEq/L (ref 3.5–5.1)
Sodium: 142 mEq/L (ref 135–145)
TOTAL PROTEIN: 6.7 g/dL (ref 6.0–8.3)
Total Bilirubin: 1.2 mg/dL (ref 0.2–1.2)

## 2016-11-14 LAB — IRON: Iron: 50 ug/dL (ref 42–165)

## 2016-11-14 MED ORDER — BENZONATATE 100 MG PO CAPS: 100.0000 mg | ORAL_CAPSULE | Freq: Three times a day (TID) | ORAL | 0 refills | Status: DC | PRN

## 2016-11-14 NOTE — Progress Notes (Signed)
Patient presents to clinic today to establish care.  Acute Concerns: Notes voice hoarseness with cough that was productive of green sputum starting 4 days go. Denies fever, chills. Endorses sinus pressure and bilateral ear pressure that is intermittent. Just finished course of Augmentin for sinusitis. Notes symptoms have improved in the past 48 hours. Cough has become dry.  Patient notes nighttime aching and crawling sensation of legs. Affecting sleep. Is staying well hydrated. Endorses well-balanced diet. Sometimes will take Iuprofen or Aleve. Denies history of iron deficiency.  Chronic Issues:  Hyperlipidemia -- Is currently on Lipitor 20 mg daily. Tolerating well. Denies myalgias or arthralgias.  Palpitations -- Followed by Cardiology. Patient placed on Metoprolol XL 25 mg daily. Is taking as directed.  Patient denies chest pain, palpitations, lightheadedness, dizziness, vision changes or frequent headaches.   Depression/Anxiety -- Currently on Citalopram 20 mg daily. Is taking as directed. Denies breakthrough symptoms while on regimen. Denies SI/HI.     Health Maintenance: Immunizations -- up-to-date Colonoscopy -- Had colonoscopy in 2012  as twin brother was noted to have polyps. Normal per patient.   Past Medical History:  Diagnosis Date  . Allergy   . Anxiety and depression   . Hyperlipidemia   . Prostate infection     Past Surgical History:  Procedure Laterality Date  . SPINE SURGERY Right    disc shaved on right side  . TONSILLECTOMY      Current Outpatient Prescriptions on File Prior to Visit  Medication Sig Dispense Refill  . atorvastatin (LIPITOR) 20 MG tablet Take 20 mg by mouth daily at 6 PM.     . citalopram (CELEXA) 40 MG tablet Take 40 mg by mouth daily. TAKE ONE HALF TABLET BY MOUTH DAILY    . metoprolol succinate (TOPROL-XL) 25 MG 24 hr tablet Take 0.5 tablets (12.5 mg total) by mouth daily. 30 tablet 6   No current facility-administered medications on  file prior to visit.     Allergies  Allergen Reactions  . Augmentin [Amoxicillin-Pot Clavulanate] Diarrhea    Family History  Problem Relation Age of Onset  . Hypertension Mother   . Hypertension Father   . Heart disease Father     CHF   . Hypertension Sister     Social History   Social History  . Marital status: Married    Spouse name: N/A  . Number of children: N/A  . Years of education: N/A   Occupational History  . Not on file.   Social History Main Topics  . Smoking status: Never Smoker  . Smokeless tobacco: Never Used  . Alcohol use No  . Drug use: No  . Sexual activity: Yes    Partners: Female   Other Topics Concern  . 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    Review of Systems  Constitutional: Negative for fever and weight loss.  HENT: Positive for congestion. Negative for ear pain, hearing loss, sinus pain and tinnitus.   Eyes: Negative for blurred vision and double vision.  Respiratory: Positive for cough. Negative for hemoptysis, sputum production, shortness of breath and wheezing.   Cardiovascular: Negative for chest pain and palpitations.  Musculoskeletal: Negative for joint pain and myalgias.  Neurological: Negative for dizziness and headaches.  Psychiatric/Behavioral: Negative.    BP 126/84   Pulse 84   Temp 98.5 F (  36.9 C) (Oral)   Resp 16   Ht 6\' 2"  (1.88 m)   Wt 223 lb (101.2 kg)   SpO2 96%   BMI 28.63 kg/m   Physical Exam  Constitutional: He is oriented to person, place, and time and well-developed, well-nourished, and in no distress.  HENT:  Head: Normocephalic and atraumatic.  Eyes: Conjunctivae are normal.  Neck: Neck supple.  Cardiovascular: Normal rate, regular rhythm, normal heart sounds and intact distal pulses.   Pulses:      Popliteal pulses are 2+ on the right side, and 2+ on the left side.        Dorsalis pedis pulses are 2+ on the right side, and 2+ on the left side.  Pulmonary/Chest: Effort normal and breath sounds normal. No respiratory distress. He has no wheezes. He has no rales. He exhibits no tenderness.  Musculoskeletal:       Right knee: Normal.       Left knee: Normal.       Right lower leg: Normal.       Left lower leg: Normal.  Neurological: He is alert and oriented to person, place, and time.  Skin: Skin is warm and dry. No rash noted.  Psychiatric: Affect normal.  Vitals reviewed.  Assessment/Plan: Restless leg syndrome Will assess electrolytes and iron levels. If abnormal will treat accordingly. If normal will consider course of gabapentin or ropinirole. Supportive measures including stretching reviewed.   Hyperlipidemia LDL goal <130 Giving aches will cut back Lipitor to 10 mg daily over the next few weeks to see if aches improve. Will repeat lipids in 1 month.   Generalized anxiety disorder Doing very well. Continue current medications.     Leeanne Rio, PA-C

## 2016-11-14 NOTE — Assessment & Plan Note (Signed)
Will assess electrolytes and iron levels. If abnormal will treat accordingly. If normal will consider course of gabapentin or ropinirole. Supportive measures including stretching reviewed.

## 2016-11-14 NOTE — Patient Instructions (Signed)
Please go to the lab for blood work. I will call you with your results.  Please cut lipitor in half for now. We will recheck cholesterol in 1 month.  Continue other medications as directed. We will alter regimen based on results.  Restless Legs Syndrome Introduction Restless legs syndrome is a condition that causes uncomfortable feelings or sensations in the legs, especially while sitting or lying down. The sensations usually cause an overwhelming urge to move the legs. The arms can also sometimes be affected. The condition can range from mild to severe. The symptoms often interfere with a person's ability to sleep. What are the causes? The cause of this condition is not known. What increases the risk? This condition is more likely to develop in:  People who are older than age 47.  Pregnant women. In general, restless legs syndrome is more common in women than in men.  People who have a family history of the condition.  People who have certain medical conditions, such as iron deficiency, kidney disease, Parkinson disease, or nerve damage.  People who take certain medicines, such as medicines for high blood pressure, nausea, colds, allergies, depression, and some heart conditions. What are the signs or symptoms? The main symptom of this condition is uncomfortable sensations in the legs. These sensations may be:  Described as pulling, tingling, prickling, throbbing, crawling, or burning.  Worse while you are sitting or lying down.  Worse during periods of rest or inactivity.  Worse at night, often interfering with your sleep.  Accompanied by a very strong urge to move your legs.  Temporarily relieved by movement of your legs. The sensations usually affect both sides of the body. The arms can also be affected, but this is rare. People who have this condition often have tiredness during the day because of their lack of sleep at night. How is this diagnosed? This condition may  be diagnosed based on your description of the symptoms. You may also have tests, including blood tests, to check for other conditions that may lead to your symptoms. In some cases, you may be asked to spend some time in a sleep lab so your sleeping can be monitored. How is this treated? Treatment for this condition is focused on managing the symptoms. Treatment may include:  Self-help and lifestyle changes.  Medicines. Follow these instructions at home:  Take medicines only as directed by your health care provider.  Try these methods to get temporary relief from the uncomfortable sensations:  Massage your legs.  Walk or stretch.  Take a cold or hot bath.  Practice good sleep habits. For example, go to bed and get up at the same time every day.  Exercise regularly.  Practice ways of relaxing, such as yoga or meditation.  Avoid caffeine and alcohol.  Do not use any tobacco products, including cigarettes, chewing tobacco, or electronic cigarettes. If you need help quitting, ask your health care provider.  Keep all follow-up visits as directed by your health care provider. This is important. Contact a health care provider if: Your symptoms do not improve with treatment, or they get worse. This information is not intended to replace advice given to you by your health care provider. Make sure you discuss any questions you have with your health care provider. Document Released: 10/07/2002 Document Revised: 03/24/2016 Document Reviewed: 10/13/2014  2017 Elsevier

## 2016-11-14 NOTE — Assessment & Plan Note (Signed)
Giving aches will cut back Lipitor to 10 mg daily over the next few weeks to see if aches improve. Will repeat lipids in 1 month.

## 2016-11-14 NOTE — Assessment & Plan Note (Signed)
Doing very well. Continue current medications.

## 2016-11-14 NOTE — Progress Notes (Signed)
Pre visit review using our clinic review tool, if applicable. No additional management support is needed unless otherwise documented below in the visit note. 

## 2016-11-15 ENCOUNTER — Other Ambulatory Visit: Payer: Self-pay | Admitting: Physician Assistant

## 2016-11-15 MED ORDER — GABAPENTIN 100 MG PO CAPS: ORAL_CAPSULE | ORAL | 1 refills | Status: DC

## 2016-11-18 ENCOUNTER — Telehealth: Payer: Self-pay | Admitting: Family Medicine

## 2016-11-18 ENCOUNTER — Telehealth: Payer: Self-pay | Admitting: Physician Assistant

## 2016-11-18 MED ORDER — DOXYCYCLINE HYCLATE 100 MG PO CAPS: 100.0000 mg | ORAL_CAPSULE | Freq: Two times a day (BID) | ORAL | 0 refills | Status: DC

## 2016-11-18 NOTE — Telephone Encounter (Signed)
Pt states he was seen in office last week and was instructed to call back if not feeling better to get a rx.  Patient reports he still has sore thoat and hoarseness.  He states that Augmentin makes his stomach upset and does not want this med sent in.    Pharmacy:   [id number] - [place], [place] [phone] (Phone) [phone] (Fax)

## 2016-11-18 NOTE — Telephone Encounter (Signed)
Opened in error

## 2016-11-18 NOTE — Telephone Encounter (Signed)
I have sent in Rx for Doxycycline for continued sinusitis symptoms.

## 2016-11-18 NOTE — Telephone Encounter (Signed)
Advised patient the rx for Doxycycline sent to the pharmacy.

## 2016-11-30 ENCOUNTER — Ambulatory Visit (INDEPENDENT_AMBULATORY_CARE_PROVIDER_SITE_OTHER): Payer: BC Managed Care – PPO | Admitting: Physician Assistant

## 2016-11-30 ENCOUNTER — Encounter: Payer: Self-pay | Admitting: Physician Assistant

## 2016-11-30 VITALS — BP 130/76 | HR 78 | Temp 98.2°F | Resp 16 | Ht 74.0 in | Wt 222.0 lb

## 2016-11-30 DIAGNOSIS — L738 Other specified follicular disorders: Secondary | ICD-10-CM

## 2016-11-30 MED ORDER — CEPHALEXIN 500 MG PO CAPS: 500.0000 mg | ORAL_CAPSULE | Freq: Two times a day (BID) | ORAL | 0 refills | Status: DC

## 2016-11-30 NOTE — Progress Notes (Signed)
Pre visit review using our clinic review tool, if applicable. No additional management support is needed unless otherwise documented below in the visit note. 

## 2016-11-30 NOTE — Patient Instructions (Signed)
Please avoid touching, poking or prodding at the area. Apply warm compresses to help soothe the area. Please take antibiotic as directed. If not improving, please call as I would want a Dermatologist to assess.

## 2016-11-30 NOTE — Progress Notes (Signed)
Patient presents to clinic today c/o painful bump in his scrotum x 1 week. Endorses was swollen at first note but not tender. States he tried to squeeze and drain and to use a needle to pop the area without success. Since then has noted redness and tenderness without drainage. Denies other lesion. Denies fever, chills malaise or fatigue. Denies testicular mass or tenderness.   Past Medical History:  Diagnosis Date  . Allergy   . Anxiety and depression   . Hyperlipidemia   . Prostate infection     Current Outpatient Prescriptions on File Prior to Visit  Medication Sig Dispense Refill  . atorvastatin (LIPITOR) 20 MG tablet Take 20 mg by mouth daily at 6 PM.     . citalopram (CELEXA) 40 MG tablet Take 40 mg by mouth daily. TAKE ONE HALF TABLET BY MOUTH DAILY    . gabapentin (NEURONTIN) 100 MG capsule Take 1 capsule 2 hours before bedtime each evening 30 capsule 1  . metoprolol succinate (TOPROL-XL) 25 MG 24 hr tablet Take 0.5 tablets (12.5 mg total) by mouth daily. 30 tablet 6  . Multiple Vitamin (MULTIVITAMIN) tablet Take 1 tablet by mouth daily.     No current facility-administered medications on file prior to visit.     Allergies  Allergen Reactions  . Augmentin [Amoxicillin-Pot Clavulanate] Diarrhea    Family History  Problem Relation Age of Onset  . Hypertension Mother   . Hypertension Father   . Heart disease Father     CHF   . Hypertension Sister     Social History   Social History  . Marital status: Married    Spouse name: N/A  . Number of children: N/A  . Years of education: N/A   Social History Main Topics  . Smoking status: Never Smoker  . Smokeless tobacco: Never Used  . Alcohol use No  . Drug use: No  . Sexual activity: Yes    Partners: Female   Other Topics Concern  . 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    Review of Systems  - See HPI.  All other ROS are negative.  BP 130/76   Pulse 78   Temp 98.2 F (36.8 C) (Oral)   Resp 16   Ht '6\' 2"'  (1.88 m)   Wt 222 lb (100.7 kg)   SpO2 98%   BMI 28.50 kg/m   Physical Exam  Constitutional: He is oriented to person, place, and time and well-developed, well-nourished, and in no distress.  HENT:  Head: Normocephalic and atraumatic.  Eyes: Conjunctivae are normal.  Neck: Neck supple.  Cardiovascular: Normal rate, regular rhythm, normal heart sounds and intact distal pulses.   Pulmonary/Chest: Effort normal and breath sounds normal. No respiratory distress. He has no wheezes. He has no rales. He exhibits no tenderness.  Genitourinary:  Genitourinary Comments: Small raised bump, SQ of left lower scrotum without fluctuance or induration. There is tenderness and erythema noted. Seems consistent with infected sebaceous gland. No pelvic adenopathy noted or other abnormality.  Neurological: He is alert and oriented to person, place, and time.  Skin: Skin is warm and dry.  Vitals reviewed.   Recent Results (from the past 2160 hour(s))  Iron     Status: None   Collection Time: 11/14/16  3:09 PM  Result Value Ref Range   Iron 50 42 -  165 ug/dL  Comp Met (CMET)     Status: None   Collection Time: 11/14/16  3:09 PM  Result Value Ref Range   Sodium 142 135 - 145 mEq/L   Potassium 4.4 3.5 - 5.1 mEq/L   Chloride 104 96 - 112 mEq/L   CO2 30 19 - 32 mEq/L   Glucose, Bld 89 70 - 99 mg/dL   BUN 16 6 - 23 mg/dL   Creatinine, Ser 1.07 0.40 - 1.50 mg/dL   Total Bilirubin 1.2 0.2 - 1.2 mg/dL   Alkaline Phosphatase 71 39 - 117 U/L   AST 23 0 - 37 U/L   ALT 30 0 - 53 U/L   Total Protein 6.7 6.0 - 8.3 g/dL   Albumin 4.5 3.5 - 5.2 g/dL   Calcium 9.6 8.4 - 10.5 mg/dL   GFR 78.25 >60.00 mL/min   Assessment/Plan: 1. Infected sebaceous gland Infected fordyce spot/gland. Mild. No induration or fluctuance. Rx Keflex. Supportive measures discussed. FU if no improvement within 48  hours of antibiotic.    Leeanne Rio, PA-C

## 2016-12-13 ENCOUNTER — Ambulatory Visit: Payer: BC Managed Care – PPO | Admitting: Physician Assistant

## 2017-01-08 ENCOUNTER — Other Ambulatory Visit: Payer: Self-pay | Admitting: Physician Assistant

## 2017-01-10 ENCOUNTER — Encounter: Payer: Self-pay | Admitting: Physician Assistant

## 2017-01-10 ENCOUNTER — Ambulatory Visit (INDEPENDENT_AMBULATORY_CARE_PROVIDER_SITE_OTHER): Payer: BC Managed Care – PPO | Admitting: Physician Assistant

## 2017-01-10 VITALS — BP 122/72 | HR 63 | Temp 97.7°F | Resp 14 | Ht 74.0 in | Wt 221.0 lb

## 2017-01-10 DIAGNOSIS — R0681 Apnea, not elsewhere classified: Secondary | ICD-10-CM

## 2017-01-10 DIAGNOSIS — M25561 Pain in right knee: Secondary | ICD-10-CM | POA: Diagnosis not present

## 2017-01-10 DIAGNOSIS — G8929 Other chronic pain: Secondary | ICD-10-CM | POA: Diagnosis not present

## 2017-01-10 DIAGNOSIS — M25562 Pain in left knee: Secondary | ICD-10-CM

## 2017-01-10 MED ORDER — GABAPENTIN 300 MG PO CAPS: 300.0000 mg | ORAL_CAPSULE | Freq: Every day | ORAL | 3 refills | Status: DC

## 2017-01-10 NOTE — Patient Instructions (Signed)
You will be contacted for assessment by Sleep Medicine for sleep study.  Start the new dose of Gabapentin as directed for knee pain. Follow-up with me in 1 month. Return sooner if needed.

## 2017-01-10 NOTE — Progress Notes (Signed)
Pre visit review using our clinic review tool, if applicable. No additional management support is needed unless otherwise documented below in the visit note. 

## 2017-01-10 NOTE — Progress Notes (Signed)
Patient presents to clinic today discuss potential need for a referral to sleep medicine. Patient endorses loud snoring with apneic episodes noted by wife. Notes daytime somnolence. Has been told he likely has OSA before. Was set up for sleep testing but was unable to have done as he also needed back surgery at the time. Patient denies chest pain, palpitations, lightheadedness, dizziness, vision changes or frequent headaches.  Patient also with chronic knee pain. Is currently on Gabapentin 100 mg daily with some relief. Notes not as effective as before. Is tolerating medication well.   Past Medical History:  Diagnosis Date  . Allergy   . Anxiety and depression   . Hyperlipidemia   . Prostate infection     Current Outpatient Prescriptions on File Prior to Visit  Medication Sig Dispense Refill  . atorvastatin (LIPITOR) 20 MG tablet Take 20 mg by mouth daily at 6 PM.     . citalopram (CELEXA) 40 MG tablet Take 40 mg by mouth daily. TAKE ONE HALF TABLET BY MOUTH DAILY    . metoprolol succinate (TOPROL-XL) 25 MG 24 hr tablet Take 0.5 tablets (12.5 mg total) by mouth daily. 30 tablet 6  . Multiple Vitamin (MULTIVITAMIN) tablet Take 1 tablet by mouth daily.     No current facility-administered medications on file prior to visit.     Allergies  Allergen Reactions  . Augmentin [Amoxicillin-Pot Clavulanate] Diarrhea    Family History  Problem Relation Age of Onset  . Hypertension Mother   . Hypertension Father   . Heart disease Father     CHF   . Hypertension Sister     Social History   Social History  . Marital status: Married    Spouse name: N/A  . Number of children: N/A  . Years of education: N/A   Social History Main Topics  . Smoking status: Never Smoker  . Smokeless tobacco: Never Used  . Alcohol use No  . Drug use: No  . Sexual activity: Yes    Partners: Female   Other Topics Concern  . 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   Review of Systems - See HPI.  All other ROS are negative.  BP 122/72   Pulse 63   Temp 97.7 F (36.5 C) (Oral)   Resp 14   Ht _0  (1.88 m)   Wt 221 lb (100.2 kg)   SpO2 97%   BMI 28.37 kg/m   Physical Exam  Constitutional: He is oriented to person, place, and time and well-developed, well-nourished, and in no distress.  HENT:  Head: Normocephalic and atraumatic.  Right Ear: External ear normal.  Left Ear: External ear normal.  Nose: Nose normal.  Mouth/Throat: Oropharynx is clear and moist. No oropharyngeal exudate.  Eyes: Conjunctivae are normal.  Cardiovascular: Normal rate, regular rhythm, normal heart sounds and intact distal pulses.   Pulmonary/Chest: Effort normal and breath sounds normal.  Neurological: He is alert and oriented to person, place, and time.  Skin: Skin is warm and dry. No rash noted.  Psychiatric: Affect normal.  Vitals reviewed.   Recent Results (from the past 2160 hour(s))  Iron     Status: None   Collection Time: 11/14/16  3:09 PM  Result Value Ref Range   Iron 50 42 - 165 ug/dL  Comp Met (CMET)     Status:  None   Collection Time: 11/14/16  3:09 PM  Result Value Ref Range   Sodium 142 135 - 145 mEq/L   Potassium 4.4 3.5 - 5.1 mEq/L   Chloride 104 96 - 112 mEq/L   CO2 30 19 - 32 mEq/L   Glucose, Bld 89 70 - 99 mg/dL   BUN 16 6 - 23 mg/dL   Creatinine, Ser 1.07 0.40 - 1.50 mg/dL   Total Bilirubin 1.2 0.2 - 1.2 mg/dL   Alkaline Phosphatase 71 39 - 117 U/L   AST 23 0 - 37 U/L   ALT 30 0 - 53 U/L   Total Protein 6.7 6.0 - 8.3 g/dL   Albumin 4.5 3.5 - 5.2 g/dL   Calcium 9.6 8.4 - 10.5 mg/dL   GFR 78.25 >60.00 mL/min    Assessment/Plan: 1. Apneic episode With snoring and daytime somnolence. Referral to Sleep medicine placed for sleep study - Ambulatory referral to Pulmonology  2. Chronic pain of both knees Increase gabapentin to 300 mg  daily. - gabapentin (NEURONTIN) 300 MG capsule; Take 1 capsule (300 mg total) by mouth at bedtime.  Dispense: 30 capsule; Refill: 3   Leeanne Rio, [place]

## 2017-01-31 ENCOUNTER — Telehealth: Payer: Self-pay | Admitting: Physician Assistant

## 2017-01-31 NOTE — Telephone Encounter (Signed)
Pt asking if he still needs to keep his follow up appt, pt states that his med's were discussed at last appt. Please advise if pt can cancel appt.

## 2017-01-31 NOTE — Telephone Encounter (Signed)
No follow-up needed but I did review his chart as he is overdue for physical. Seems he has been seeing Family Medicine with [facility] at [facility] and they have done his physical and are prescribing cholesterol medication. He only needs on [facility] practitioner. Please assess where he would like to continue his care

## 2017-02-01 ENCOUNTER — Ambulatory Visit: Payer: BC Managed Care – PPO | Admitting: Physician Assistant

## 2017-02-01 NOTE — Telephone Encounter (Signed)
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 Pt will call back to schedule a cpe with you around June.

## 2017-02-02 ENCOUNTER — Ambulatory Visit: Payer: BC Managed Care – PPO | Admitting: Physician Assistant

## 2017-02-06 ENCOUNTER — Ambulatory Visit (INDEPENDENT_AMBULATORY_CARE_PROVIDER_SITE_OTHER): Payer: BC Managed Care – PPO | Admitting: Physician Assistant

## 2017-02-06 ENCOUNTER — Encounter: Payer: Self-pay | Admitting: Physician Assistant

## 2017-02-06 VITALS — BP 112/80 | HR 78 | Temp 98.2°F | Resp 14 | Ht 74.0 in | Wt 221.0 lb

## 2017-02-06 DIAGNOSIS — M5116 Intervertebral disc disorders with radiculopathy, lumbar region: Secondary | ICD-10-CM | POA: Diagnosis not present

## 2017-02-06 MED ORDER — METHYLPREDNISOLONE ACETATE 80 MG/ML IJ SUSP
80.0000 mg | Freq: Once | INTRAMUSCULAR | Status: AC
  Administered 2017-02-06: 80 mg via INTRAMUSCULAR

## 2017-02-06 MED ORDER — METHYLPREDNISOLONE 4 MG PO TBPK: ORAL_TABLET | ORAL | 0 refills | Status: DC

## 2017-02-06 MED ORDER — HYDROCODONE-ACETAMINOPHEN 5-325 MG PO TABS: 1.0000 | ORAL_TABLET | Freq: Four times a day (QID) | ORAL | 0 refills | Status: DC | PRN
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
## 2017-02-06 NOTE — Patient Instructions (Signed)
The steroid shot should help with symptoms. Start the steroid taper pack tomorrow as directed. No anti-inflammatories while on this medication. Use the Hydrocodone as directed if needed for severe pain. Do not take more than as directed.  No heavy lifting or overexertion. Restart stretches as symptoms are starting to resolve.  We will get a follow-up MRI due to chronic lumbar issues. You will be contacted to schedule.  If anything worsens, or you note change to bowel or bladder habits, please go to the ER.

## 2017-02-06 NOTE — Progress Notes (Signed)
Pre visit review using our clinic review tool, if applicable. No additional management support is needed unless otherwise documented below in the visit note. 

## 2017-02-16 ENCOUNTER — Telehealth: Payer: Self-pay | Admitting: Physician Assistant

## 2017-02-16 NOTE — Telephone Encounter (Signed)
Pt states he is still having a lot of back pain traveling down legs to feet, Pt states that he is to have an MRI on Saturday. Pt states that he is not able to sleep due the pain and asking is there anything that could be done? Another shot or a pain med. Please advise.

## 2017-02-16 NOTE — Telephone Encounter (Signed)
FYI: Spoke with patient and advised patient if the pain has changed from the visit he would need an appointment for re-evaluation. He has also sent the message to his Neurosurgeon for advise He states the Hydrocodone causes upset stomach so not really taking for pain. He wants to wait until his Neurosurgeon contacts him with advise then will go from there if need come back in for an appointment.

## 2017-02-18 ENCOUNTER — Inpatient Hospital Stay: Admission: RE | Admit: 2017-02-18 | Payer: BC Managed Care – PPO | Source: Ambulatory Visit

## 2017-02-21 NOTE — Telephone Encounter (Signed)
Please follow-up with patient to see if he has seen his Neurosurgeon. Insurance has denied the MRI we ordered. His Neurosurgeon may have better luck getting this approved.

## 2017-02-23 NOTE — Telephone Encounter (Signed)
Spoke with patient yesterday and he states his neurosurgeon was waiting on approval from insurance. Will check with Dr Ellene Route office about MRI status.

## 2017-02-26 ENCOUNTER — Other Ambulatory Visit: Payer: BC Managed Care – PPO

## 2017-02-28 NOTE — Telephone Encounter (Signed)
LMOVM advising patient his MRI was denied by insurance. He will need to complete physical therapy or injection prior to another MRI. It would be best for the Neurosurgeon to order the MRI.

## 2017-03-02 ENCOUNTER — Encounter: Payer: Self-pay | Admitting: Internal Medicine

## 2017-03-02 ENCOUNTER — Ambulatory Visit (INDEPENDENT_AMBULATORY_CARE_PROVIDER_SITE_OTHER): Payer: BC Managed Care – PPO | Admitting: Internal Medicine

## 2017-03-02 VITALS — BP 128/80 | HR 75 | Ht 74.0 in | Wt 219.2 lb

## 2017-03-02 DIAGNOSIS — G4730 Sleep apnea, unspecified: Secondary | ICD-10-CM | POA: Diagnosis not present

## 2017-03-02 DIAGNOSIS — G2581 Restless legs syndrome: Secondary | ICD-10-CM | POA: Diagnosis not present

## 2017-03-02 DIAGNOSIS — G4733 Obstructive sleep apnea (adult) (pediatric): Secondary | ICD-10-CM | POA: Diagnosis not present

## 2017-03-02 DIAGNOSIS — R002 Palpitations: Secondary | ICD-10-CM | POA: Insufficient documentation

## 2017-03-02 NOTE — Assessment & Plan Note (Signed)
This may have been identified as atrial flutter but apparently responded to beta blocker therapy. Rhythm at today's exam is consistent with regular sinus.

## 2017-03-02 NOTE — Patient Instructions (Signed)
Order- schedule unattended home sleep test     Dx OSA  My team will schedule a return for you to go over results  Please call as needed

## 2017-03-02 NOTE — Assessment & Plan Note (Signed)
Probable diagnosis based on history and physical. Plan-appropriate discussion done. Schedule sleep study.

## 2017-03-02 NOTE — Progress Notes (Signed)
03/02/17-51 year old male never smoker referred courtesy of Dr Raiford Noble; never had sleep study. Pt woke himself up snoring. Wife tells him he snores loudly, witnessed apneas. Admits to daytime tiredness. Bedtime 9:30 PM, short sleep latency, waking 2 or 3 times before up at 6:30 AM. No sleep medications. Occasional hydrocodone for pain. Residual discomfort from shoulder surgery limits sleep positions. 2 cups of coffee in the morning. ENT surgery-tonsils and adenoids. Cautery for epistaxis. Treated for atrial flutter. Not aware of lung disease. Does have seasonal allergic rhinitis. He has had a diagnosis of restless legs but is aware only now of shifting sleep positions and is not on specific therapy.  Prior to Admission medications   Medication Sig Start Date End Date Taking? Authorizing Provider  atorvastatin (LIPITOR) 20 MG tablet Take 20 mg by mouth daily at 6 PM.  03/16/16 03/16/17 Yes Historical Provider, MD  citalopram (CELEXA) 40 MG tablet Take 40 mg by mouth daily. TAKE ONE HALF TABLET BY MOUTH DAILY   Yes Historical Provider, MD  gabapentin (NEURONTIN) 300 MG capsule Take 1 capsule (300 mg total) by mouth at bedtime. 01/10/17  Yes Brunetta Jeans, PA-C  HYDROcodone-acetaminophen (NORCO/VICODIN) 5-325 MG tablet Take 1 tablet by mouth every 6 (six) hours as needed for moderate pain. 02/06/17  Yes Brunetta Jeans, PA-C  metoprolol succinate (TOPROL-XL) 25 MG 24 hr tablet Take 0.5 tablets (12.5 mg total) by mouth daily. 08/29/16  Yes Liliane Shi, PA-C  Multiple Vitamin (MULTIVITAMIN) tablet Take 1 tablet by mouth daily.   Yes Historical Provider, MD   Past Medical History:  Diagnosis Date  . Allergy   . Anxiety and depression   . Hyperlipidemia   . Prostate infection    Past Surgical History:  Procedure Laterality Date  . SPINE SURGERY Right    disc shaved on right side  . TONSILLECTOMY     Family History  Problem Relation Age of Onset  . Hypertension Mother   . Hypertension  Father   . Heart disease Father     CHF   . Hypertension Sister    Social History   Social History  . Marital status: Married    Spouse name: N/A  . Number of children: N/A  . Years of education: N/A   Occupational History  . Not on file.   Social History Main Topics  . Smoking status: Never Smoker  . Smokeless tobacco: Never Used  . Alcohol use No  . Drug use: No  . Sexual activity: Yes    Partners: Female   Other Topics Concern  . 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   ROS-see HPI   + = pos Constitutional:    weight loss, night sweats, fevers, chills, + fatigue, lassitude. HEENT:    headaches, difficulty swallowing, tooth/dental problems, sore throat,       sneezing, itching, ear ache, + nasal congestion, post nasal drip, snoring CV:    chest pain, orthopnea, PND, swelling in lower extremities, anasarca,                                                   dizziness, palpitations Resp:   shortness of breath with exertion  or at rest.                productive cough,   non-productive cough, coughing up of blood.              change in color of mucus.  wheezing.   Skin:    rash or lesions. GI:  No-   heartburn, indigestion, abdominal pain, nausea, vomiting, diarrhea,                 change in bowel habits, loss of appetite GU: dysuria, change in color of urine, no urgency or frequency.   flank pain. MS:   + joint pain, stiffness, decreased range of motion, back pain. Neuro-     nothing unusual Psych:  change in mood or affect.  depression or anxiety.   memory loss.  OBJ- Physical Exam General- Alert, Oriented, Affect-appropriate, Distress- none acute Skin- rash-none, lesions- none, excoriation- none Lymphadenopathy- none Head- atraumatic            Eyes- Gross vision intact, PERRLA, conjunctivae and secretions clear            Ears- Hearing,  canals-normal            Nose- Clear, no-Septal dev, mucus, polyps, erosion, perforation             Throat- Mallampati II , mucosa clear , drainage- none, tonsils- atrophic Neck- flexible , trachea midline, no stridor , thyroid nl, carotid no bruit Chest - symmetrical excursion , unlabored           Heart/CV- RRR at this exam , no murmur , no gallop  , no rub, nl s1 s2                           - JVD- none , edema- none, stasis changes- none, varices- none           Lung- clear to P&A, wheeze- none, cough- none , dullness-none, rub- none           Chest wall-  Abd-  Br/ Gen/ Rectal- Not done, not indicated Extrem- cyanosis- none, clubbing, none, atrophy- none, strength- nl Neuro- grossly intact to observation

## 2017-03-02 NOTE — Assessment & Plan Note (Signed)
This doesn't seem to be an active issue currently from his description.

## 2017-03-29 DIAGNOSIS — G4733 Obstructive sleep apnea (adult) (pediatric): Secondary | ICD-10-CM | POA: Diagnosis not present

## 2017-04-04 DIAGNOSIS — G4733 Obstructive sleep apnea (adult) (pediatric): Secondary | ICD-10-CM | POA: Diagnosis not present

## 2017-04-05 ENCOUNTER — Other Ambulatory Visit: Payer: Self-pay | Admitting: *Deleted

## 2017-04-05 DIAGNOSIS — G4730 Sleep apnea, unspecified: Secondary | ICD-10-CM

## 2017-04-10 ENCOUNTER — Telehealth: Payer: Self-pay | Admitting: Internal Medicine

## 2017-04-10 DIAGNOSIS — G4733 Obstructive sleep apnea (adult) (pediatric): Secondary | ICD-10-CM

## 2017-04-10 NOTE — Telephone Encounter (Signed)
His home sleep test showed mild obstructive sleep apnea, stopping 7.5 times per hour with oxygen drop to 81%. The usual best choices would be either a CPAP air pressure mask or a fitted oral appliance. Unless he has a preference, I would suggest starting with new order for DME, new CPAP auto 5-20, mask of choice, humidifier, supplies, AirView    Dx OSA If he gets started on CPAP ok then we will see him as scheduled in September. He is encouraged to call us if needed.

## 2017-04-10 NOTE — Telephone Encounter (Signed)
Spoke with pt. He is aware of his sleep study results. Pt would like to proceed with CPAP therapy. Order has been placed. Nothing further was needed.

## 2017-04-10 NOTE — Telephone Encounter (Signed)
Dr Annamaria Boots- please advise on his PSG results, thanks!

## 2017-05-01 ENCOUNTER — Other Ambulatory Visit: Payer: Self-pay | Admitting: Family Medicine

## 2017-05-02 NOTE — Telephone Encounter (Signed)
Denied  needs office visit 

## 2017-05-04 ENCOUNTER — Telehealth: Payer: Self-pay | Admitting: *Deleted

## 2017-05-04 NOTE — Telephone Encounter (Signed)
Spoke with patient and advised that he needed a refill on the Citalopram and Atorvastatin. I advised patient no problem with refilling both medication. He is due for an cholesterol check so he will keep scheduled appointment tomorrow and will recheck cholesterol level and refill the Citalopram. He is agreeable.

## 2017-05-04 NOTE — Telephone Encounter (Signed)
Patient states that he needs to talk to [facility] or his CMA about his medications.   I asked if I could assist and he did not want to discuss with me.  He made an appointment for tomorrow morning, but states that he would still like a call back.

## 2017-05-05 ENCOUNTER — Ambulatory Visit (INDEPENDENT_AMBULATORY_CARE_PROVIDER_SITE_OTHER): Payer: BC Managed Care – PPO | Admitting: Physician Assistant

## 2017-05-05 ENCOUNTER — Encounter: Payer: Self-pay | Admitting: Physician Assistant

## 2017-05-05 ENCOUNTER — Other Ambulatory Visit: Payer: Self-pay | Admitting: Emergency Medicine

## 2017-05-05 VITALS — BP 118/76 | HR 71 | Temp 98.3°F | Resp 14 | Ht 74.0 in | Wt 215.0 lb

## 2017-05-05 DIAGNOSIS — F411 Generalized anxiety disorder: Secondary | ICD-10-CM

## 2017-05-05 DIAGNOSIS — E785 Hyperlipidemia, unspecified: Secondary | ICD-10-CM

## 2017-05-05 LAB — LIPID PANEL
CHOL/HDL RATIO: 3
CHOLESTEROL: 169 mg/dL (ref 0–200)
HDL: 49.9 mg/dL (ref >39.00)
NONHDL: 119.24
TRIGLYCERIDES: 212 mg/dL — AB (ref 0.0–149.0)
VLDL: 42.4 mg/dL — ABNORMAL HIGH (ref 0.0–40.0)

## 2017-05-05 LAB — LDL CHOLESTEROL, DIRECT: LDL DIRECT: 87 mg/dL

## 2017-05-05 MED ORDER — FISH OIL 1200 MG PO CAPS: 1.0000 | ORAL_CAPSULE | Freq: Every day | ORAL | 0 refills | Status: DC

## 2017-05-05 MED ORDER — CITALOPRAM HYDROBROMIDE 40 MG PO TABS: ORAL_TABLET | ORAL | 0 refills | Status: DC

## 2017-05-05 MED ORDER — ATORVASTATIN CALCIUM 20 MG PO TABS: 20.0000 mg | ORAL_TABLET | Freq: Every day | ORAL | 1 refills | Status: DC

## 2017-05-05 NOTE — Assessment & Plan Note (Signed)
Doing very well. Continue current regimen. Follow-up every 6 months.

## 2017-05-05 NOTE — Telephone Encounter (Signed)
Patient states he was seen this morning and had lab work performed.  He wants to know if results are back yet as he is waiting on results in order to get refill of Atorvastatin.  He is down to 2 pills and needs to get a refill before the weekend.  Please call patient back with lab results, if they are back.  If labs are not back please notify patient.

## 2017-05-05 NOTE — Assessment & Plan Note (Signed)
Taking medications as directed. Notes well-balanced diet. Restarting exercise, starting with low impact yoga. Repeat labs today. Will alter regimen based on results.

## 2017-05-05 NOTE — Telephone Encounter (Signed)
Advised patient of lab results and both rx sent to the pharmacy

## 2017-05-05 NOTE — Progress Notes (Deleted)
    Patient presents to clinic today c/o ***.   Past Medical History:  Diagnosis Date  . Allergy   . Anxiety and depression   . Hyperlipidemia   . Prostate infection     Current Outpatient Prescriptions on File Prior to Visit  Medication Sig Dispense Refill  . atorvastatin (LIPITOR) 20 MG tablet Take 20 mg by mouth daily at 6 PM.     . citalopram (CELEXA) 40 MG tablet Take 40 mg by mouth daily. TAKE ONE HALF TABLET BY MOUTH DAILY    . gabapentin (NEURONTIN) 300 MG capsule Take 1 capsule (300 mg total) by mouth at bedtime. 30 capsule 3  . HYDROcodone-acetaminophen (NORCO/VICODIN) 5-325 MG tablet Take 1 tablet by mouth every 6 (six) hours as needed for moderate pain. 20 tablet 0  . metoprolol succinate (TOPROL-XL) 25 MG 24 hr tablet Take 0.5 tablets (12.5 mg total) by mouth daily. 30 tablet 6  . Multiple Vitamin (MULTIVITAMIN) tablet Take 1 tablet by mouth daily.     No current facility-administered medications on file prior to visit.     Allergies  Allergen Reactions  . Augmentin [Amoxicillin-Pot Clavulanate] Diarrhea    Family History  Problem Relation Age of Onset  . Hypertension Mother   . Hypertension Father   . Heart disease Father        CHF   . Hypertension Sister     Social History   Social History  . Marital status: Married    Spouse name: N/A  . Number of children: N/A  . Years of education: N/A   Social History Main Topics  . Smoking status: Never Smoker  . Smokeless tobacco: Never Used  . Alcohol use No  . Drug use: No  . Sexual activity: Yes    Partners: Female   Other Topics Concern  . 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    Review of Systems - See HPI.  All other ROS are negative.  BP 118/76   Pulse 71   Temp 98.3 F (36.8 C) (Oral)   Resp 14   Ht 6\' 2"  (1.88 m)   Wt 215 lb (97.5 kg)   SpO2 98%   BMI 27.60 kg/m    Physical Exam  No results found for this or any previous visit (from the past 2160 hour(s)).  Assessment/Plan: No problem-specific Assessment & Plan notes found for this encounter.    Leeanne Rio, PA-C

## 2017-05-05 NOTE — Progress Notes (Signed)
Pre visit review using our clinic review tool, if applicable. No additional management support is needed unless otherwise documented below in the visit note. 

## 2017-05-05 NOTE — Progress Notes (Signed)
Patient presents to clinic today c/o for follow-up of hyperlipidemia and anxiety.  Hyperlipidemia -- Has been on a regimen of atorvastatin 20 mg daily. Is taking daily as directed. Endorses well-balanced diet overall. Is starting a yoga class next week as he has been cleared by orthopedics to start exercising.   Generalized Anxiety Disorder -- Currently on Citalopram 40 mg daily. Is taking as directed with great improvement in symptoms. Denies breakthrough symptoms. Denies SI/HI.   Past Medical History:  Diagnosis Date  . Allergy   . Anxiety and depression   . Hyperlipidemia   . Prostate infection     Current Outpatient Prescriptions on File Prior to Visit  Medication Sig Dispense Refill  . atorvastatin (LIPITOR) 20 MG tablet Take 20 mg by mouth daily at 6 PM.     . citalopram (CELEXA) 40 MG tablet Take 40 mg by mouth daily. TAKE ONE HALF TABLET BY MOUTH DAILY    . gabapentin (NEURONTIN) 300 MG capsule Take 1 capsule (300 mg total) by mouth at bedtime. 30 capsule 3  . HYDROcodone-acetaminophen (NORCO/VICODIN) 5-325 MG tablet Take 1 tablet by mouth every 6 (six) hours as needed for moderate pain. 20 tablet 0  . metoprolol succinate (TOPROL-XL) 25 MG 24 hr tablet Take 0.5 tablets (12.5 mg total) by mouth daily. 30 tablet 6  . Multiple Vitamin (MULTIVITAMIN) tablet Take 1 tablet by mouth daily.     No current facility-administered medications on file prior to visit.     Allergies  Allergen Reactions  . Augmentin [Amoxicillin-Pot Clavulanate] Diarrhea    Family History  Problem Relation Age of Onset  . Hypertension Mother   . Hypertension Father   . Heart disease Father        CHF   . Hypertension Sister     Social History   Social History  . Marital status: Married    Spouse name: N/A  . Number of children: N/A  . Years of education: N/A   Social History Main Topics  . Smoking status: Never Smoker  . Smokeless tobacco: Never Used  . Alcohol use No  . Drug use:  No  . Sexual activity: Yes    Partners: Female   Other Topics Concern  . 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   Review of Systems - See HPI.  All other ROS are negative.  BP 118/76   Pulse 71   Temp 98.3 F (36.8 C) (Oral)   Resp 14   Ht 6\' 2"  (1.88 m)   Wt 215 lb (97.5 kg)   SpO2 98%   BMI 27.60 kg/m   Physical Exam  Constitutional: He is oriented to person, place, and time and well-developed, well-nourished, and in no distress.  HENT:  Head: Normocephalic and atraumatic.  Eyes: Conjunctivae are normal.  Neck: Neck supple.  Cardiovascular: Normal rate, regular rhythm, normal heart sounds and intact distal pulses.   Pulmonary/Chest: Effort normal and breath sounds normal. No respiratory distress. He has no wheezes. He has no rales. He exhibits no tenderness.  Neurological: He is alert and oriented to person, place, and time.  Skin: Skin is warm and dry. No rash noted.  Psychiatric: Affect normal.  Vitals reviewed.  Assessment/Plan: Hyperlipidemia LDL goal <130 Taking medications as directed. Notes well-balanced diet. Restarting exercise, starting with low impact yoga. Repeat labs today. Will  alter regimen based on results.  Generalized anxiety disorder Doing very well. Continue current regimen. Follow-up every 6 months.    Leeanne Rio, PA-C

## 2017-05-05 NOTE — Patient Instructions (Signed)
Please go to the lab for blood work. For now, continue medications as directed. Start the Yoga class as scheduled. Keep up with diet and exercise.  We will alter your regimen based on lab results.

## 2017-05-08 ENCOUNTER — Telehealth: Payer: Self-pay | Admitting: Internal Medicine

## 2017-05-08 NOTE — Telephone Encounter (Signed)
Called [facility] and spoke to [place] they have his order and she is going to find out what happened and call him. Called pt and made him aware someone would be calling him.

## 2017-05-11 ENCOUNTER — Other Ambulatory Visit: Payer: Self-pay | Admitting: Emergency Medicine

## 2017-05-11 DIAGNOSIS — M25561 Pain in right knee: Principal | ICD-10-CM

## 2017-05-11 DIAGNOSIS — G8929 Other chronic pain: Secondary | ICD-10-CM

## 2017-05-11 DIAGNOSIS — M25562 Pain in left knee: Principal | ICD-10-CM

## 2017-05-11 MED ORDER — GABAPENTIN 300 MG PO CAPS: 300.0000 mg | ORAL_CAPSULE | Freq: Every day | ORAL | 5 refills | Status: DC

## 2017-05-15 ENCOUNTER — Telehealth: Payer: Self-pay | Admitting: Internal Medicine

## 2017-05-15 NOTE — Telephone Encounter (Signed)
Spoke with patient. Advised patient that per his chart, it looks like it is [facility]. Since I wasn't sure of the address, gave the patient [facility] number.  Patient verbalized understanding. Nothing else needed at time of call.

## 2017-07-06 ENCOUNTER — Ambulatory Visit: Payer: BC Managed Care – PPO | Admitting: Internal Medicine

## 2017-07-06 ENCOUNTER — Telehealth: Payer: Self-pay | Admitting: Internal Medicine

## 2017-07-06 NOTE — Telephone Encounter (Signed)
lmtcb X1 for pt. CY has an opening on Monday at 3:00, or pt can be scheduled at a held slot per CY's approval.

## 2017-07-06 NOTE — Telephone Encounter (Signed)
Yes any RNA slot can be used. Thanks.

## 2017-07-06 NOTE — Telephone Encounter (Signed)
Katie please advise if ok to use a hold spot for an appt for this pt.  Thanks

## 2017-07-07 NOTE — Telephone Encounter (Signed)
Spoke with the pt  He states he already sched f/u with Judson Roch Nothing further needed

## 2017-07-26 ENCOUNTER — Encounter: Payer: Self-pay | Admitting: Acute Care

## 2017-07-26 ENCOUNTER — Ambulatory Visit (INDEPENDENT_AMBULATORY_CARE_PROVIDER_SITE_OTHER): Payer: BC Managed Care – PPO | Admitting: Acute Care

## 2017-07-26 VITALS — BP 126/76 | HR 72 | Ht 74.0 in | Wt 219.4 lb

## 2017-07-26 DIAGNOSIS — Z23 Encounter for immunization: Secondary | ICD-10-CM | POA: Diagnosis not present

## 2017-07-26 DIAGNOSIS — G4733 Obstructive sleep apnea (adult) (pediatric): Secondary | ICD-10-CM | POA: Diagnosis not present

## 2017-07-26 NOTE — Assessment & Plan Note (Signed)
Patient received CPAP machine July 2018 States he is compliant however is concerned about the number of hours per night he uses device He often awakens and finds he has removed it in his sleep. Would like to try fullface mask again No download available Plan Enroll patient in [place]  today. Place an order for full face mask with [facility] today. We will call [facility] to assess what the issues are with [facility] returning your calls. We will get a download and call you with the results. Continue on CPAP at bedtime. You appear to be benefiting from the treatment Goal is to wear for at least 6 hours each night for maximal clinical benefit. Continue to work on weight loss, as the link between excess weight  and sleep apnea is well established.  Do not drive if sleepy. Remember to clean mask, tubing, filter, and reservoir once weekly with soapy water.  Follow up in 2 months with download to ensure full face mask is treating your sleep apnea. Please contact office for sooner follow up if symptoms do not improve or worsen or seek emergency care

## 2017-07-26 NOTE — Patient Instructions (Addendum)
It is nice to meet you today. Enroll patient in [place]  today. Place an order for full face mask with [facility] today. We will call [facility] to assess what the issues are with [facility] returning your calls. We will get a download and call you with the results. Continue on CPAP at bedtime. You appear to be benefiting from the treatment Goal is to wear for at least 6 hours each night for maximal clinical benefit. Continue to work on weight loss, as the link between excess weight  and sleep apnea is well established.  Do not drive if sleepy. Remember to clean mask, tubing, filter, and reservoir once weekly with soapy water.  Follow up in 2 months with download to ensure full face mask is treating your sleep apnea. Please contact office for sooner follow up if symptoms do not improve or worsen or seek emergency care

## 2017-07-26 NOTE — Progress Notes (Addendum)
History of Present Illness Francisco Pena is a 51 y.o. male never smoker with OSA on CPAP ( Therapy initiated 04/2017). He is followed by Dr. Annamaria Boots.   07/26/2017 Follow up for CPAP compliance: Pt. Presents for follow up. He states he has had some issues with [facility]. He states he needs supplies. He started CPAP therapy 04/2017.( Order was placed 04/12/2017). He is using the nasal pillows due to the fact he had neck and back surgery.He wants to try the full mask again now that he has recovered.Marland Kitchen He called [facility] and requested a full face mask , which he has not received. He has called to speak with management in the [place] office, and in the [place] office and has not had a return call.Pt. States he is wearing his CPAP every night. He is aware that he is removing it in his sleep some nights. He is unsure of the time each night he is actually getting CPAP therapy. He does not know the number of hours per night he is actually wearing the device. He does feel more rested with use the next morning. He has sleepiness during the day when he pulls it off through the night.I do not have a download today. Faxed down load was requested by the office, but has not arrived from [place]. Patient denies fever, chest pain, orthopnea, or hemoptysis.  Test Results: Down Load: Not available at office visit, despite being requested Download received 07/27/2017 Download 05/16/2017 through 07/26/2017 Air sense 10 AutoSet: 5 cm H2O-20 cm H2O Usage days 61 of 72 or 85% Greater than 4 hours 3 days or 4% Less than 4 hours 58 days or 81% Average usage 1 hour and 59 minutes Median pressure 5.6 cm H2O, maximum pressure 8.5 cm H2O AHI equal 6.7 . Unattended home sleep study: 03/29/2017:  mild obstructive sleep apnea, stopping 7.5 times per hour with oxygen drop to 81%.>> CPAP ordered AutoSet 5-20 cm H2O mask of choice,  CBC Latest Ref Rng & Units 02/22/2013  WBC 4.0 - 10.5 K/uL 9.7  Hemoglobin 13.0 - 17.0 g/dL 14.8    Hematocrit 39.0 - 52.0 % 42.1  Platelets 150 - 400 K/uL 211    BMP Latest Ref Rng & Units 11/14/2016 02/22/2013  Glucose 70 - 99 mg/dL 89 124(H)  BUN 6 - 23 mg/dL 16 20  Creatinine 0.40 - 1.50 mg/dL 1.07 1.22  Sodium 135 - 145 mEq/L 142 142  Potassium 3.5 - 5.1 mEq/L 4.4 3.5  Chloride 96 - 112 mEq/L 104 101  CO2 19 - 32 mEq/L 30 29  Calcium 8.4 - 10.5 mg/dL 9.6 9.5     Past medical hx Past Medical History:  Diagnosis Date  . Allergy   . Anxiety and depression   . Hyperlipidemia   . Prostate infection      Social History  Substance Use Topics  . Smoking status: Never Smoker  . Smokeless tobacco: Never Used  . Alcohol use No    Francisco Pena reports that he has never smoked. He has never used smokeless tobacco. He reports that he does not drink alcohol or use drugs.  Tobacco Cessation: Never smoker  Past surgical hx, Family hx, Social hx all reviewed.  Current Outpatient Prescriptions on File Prior to Visit  Medication Sig  . atorvastatin (LIPITOR) 20 MG tablet Take 1 tablet (20 mg total) by mouth daily at 6 PM.  . citalopram (CELEXA) 40 MG tablet TAKE ONE HALF TABLET BY MOUTH DAILY  . gabapentin (NEURONTIN) 300  MG capsule Take 1 capsule (300 mg total) by mouth at bedtime.  Marland Kitchen HYDROcodone-acetaminophen (NORCO/VICODIN) 5-325 MG tablet Take 1 tablet by mouth every 6 (six) hours as needed for moderate pain.  . metoprolol succinate (TOPROL-XL) 25 MG 24 hr tablet Take 0.5 tablets (12.5 mg total) by mouth daily.  . Multiple Vitamin (MULTIVITAMIN) tablet Take 1 tablet by mouth daily.  . Omega-3 Fatty Acids (FISH OIL) 1200 MG CAPS Take 1 capsule (1,200 mg total) by mouth daily.   No current facility-administered medications on file prior to visit.      Allergies  Allergen Reactions  . Augmentin [Amoxicillin-Pot Clavulanate] Diarrhea    Review Of Systems:  Constitutional:   No  weight loss, night sweats,  Fevers, chills,occasional  fatigue, or  lassitude.  HEENT:   No  headaches,  Difficulty swallowing,  Tooth/dental problems, or  Sore throat,                No sneezing, itching, ear ache, nasal congestion, post nasal drip,   CV:  No chest pain,  Orthopnea, PND, swelling in lower extremities, anasarca, dizziness, palpitations, syncope.   GI  No heartburn, indigestion, abdominal pain, nausea, vomiting, diarrhea, change in bowel habits, loss of appetite, bloody stools.   Resp: No shortness of breath with exertion or at rest.  No excess mucus, no productive cough,  No non-productive cough,  No coughing up of blood.  No change in color of mucus.  No wheezing.  No chest wall deformity  Skin: no rash or lesions.  GU: no dysuria, change in color of urine, no urgency or frequency.  No flank pain, no hematuria   MS:  No joint pain or swelling.  No decreased range of motion.  No back pain.  Psych:  No change in mood or affect. No depression or anxiety.  No memory loss.   Vital Signs BP 126/76 (BP Location: Left Arm, Cuff Size: Normal)   Pulse 72   Ht 6\' 2"  (1.88 m)   Wt 219 lb 6.4 oz (99.5 kg)   SpO2 95%   BMI 28.17 kg/m    Physical Exam:  General- No distress,  A&Ox3, pleasant but with many complaints about [facility] ENT: No sinus tenderness, TM clear, pale nasal mucosa, no oral exudate,no post nasal drip, no LAN Cardiac: S1, S2, regular rate and rhythm, no murmur Chest: No wheeze/ rales/ dullness; no accessory muscle use, no nasal flaring, no sternal retractions Abd.: Soft Non-tender, nondistended, bowel sounds positive Ext: No clubbing cyanosis, edema Neuro:  normal strength, cranial nerves intact, appropriate Skin: No rashes, warm and dry Psych: normal mood and behavior   Assessment/Plan  Obstructive sleep apnea Patient received CPAP machine July 2018 States he is compliant however is concerned about the number of hours per night he uses device He often awakens and finds he has removed it in his sleep. Would like to try fullface mask  again No download available Plan Enroll patient in [place]  today. Place an order for full face mask with [facility] today. We will call [facility] to assess what the issues are with [facility] returning your calls. We will get a download and call you with the results. Continue on CPAP at bedtime. You appear to be benefiting from the treatment Goal is to wear for at least 6 hours each night for maximal clinical benefit. Continue to work on weight loss, as the link between excess weight  and sleep apnea is well established.  Do not drive if sleepy.  Remember to clean mask, tubing, filter, and reservoir once weekly with soapy water.  Follow up in 2 months with download to ensure full face mask is treating your sleep apnea. Please contact office for sooner follow up if symptoms do not improve or worsen or seek emergency care    Health maintenance Flu shot today  Magdalen Spatz, NP 07/26/2017  7:24 PM

## 2017-07-27 ENCOUNTER — Telehealth: Payer: Self-pay | Admitting: Acute Care

## 2017-07-27 NOTE — Telephone Encounter (Signed)
ATC pt, no answer. Left message for pt to call back. I wanted to let him know he had to start over.

## 2017-07-27 NOTE — Telephone Encounter (Signed)
Jonni Sanger from [facility] called and has answers on Francisco Pena.  [phone] or [phone]

## 2017-07-27 NOTE — Telephone Encounter (Signed)
Spoke with Francisco Pena, who states he faxed over download. Pt is with [facility] in [place]. Pt is non compliance, due to this pt will need to start all over.  Francisco Pena states he has spoken with Francisco Pena with [facility]. Francisco Pena will reach out to pt.   I have checked fax machine, SG & CY's folder and download has not yet been received. Will continue to watch for download.  Will route to SG to make aware, as SG requested download.

## 2017-07-27 NOTE — Telephone Encounter (Signed)
Francisco Pena returned [place] call.  [phone].  -tr

## 2017-07-31 NOTE — Telephone Encounter (Signed)
lmomtrcb x 2 for pt.

## 2017-07-31 NOTE — Telephone Encounter (Signed)
Spoke with pt. He is aware that he will have to start the process over due to being non compliant. Pt states that Jonni Sanger made no mention to him about this when he picked up his CPAP mask last week. lmtcb x1 for Jonni Sanger to get clarification.

## 2017-07-31 NOTE — Telephone Encounter (Signed)
Patient returned call, CB is [phone].

## 2017-08-01 NOTE — Telephone Encounter (Signed)
Attempted to call [employer]---Andy---will need to call back later.

## 2017-08-02 NOTE — Telephone Encounter (Signed)
LMOM TCB x1 Did mention to patient that he is active in [place] if emailing the office would be easier

## 2017-08-02 NOTE — Telephone Encounter (Signed)
Pt returned phone call, pt contact # [phone]

## 2017-08-02 NOTE — Telephone Encounter (Signed)
Patient returned call, CB is [phone]

## 2017-08-02 NOTE — Telephone Encounter (Signed)
LMTCB

## 2017-08-02 NOTE — Telephone Encounter (Signed)
Called and lmomtcb x 3 for the pt to see if [employer] has contacted the pt about starting his process over again due to non compliance.

## 2017-08-07 ENCOUNTER — Encounter: Payer: Self-pay | Admitting: *Deleted

## 2017-08-07 NOTE — Telephone Encounter (Signed)
email has been sent to the pt to follow up on [employer] contacting him about starting the process over.

## 2017-08-09 NOTE — Telephone Encounter (Signed)
Pt states he received CPAP mask from [facility], and there is nothing else needed.Marland KitchenMarland Kitchen

## 2017-08-09 NOTE — Telephone Encounter (Signed)
Noted. Will close this message.  

## 2017-08-09 NOTE — Telephone Encounter (Signed)
ATC pt, no answer. Left message for pt to call back.  

## 2017-09-04 ENCOUNTER — Ambulatory Visit: Payer: BC Managed Care – PPO | Admitting: Cardiovascular Disease

## 2017-09-05 ENCOUNTER — Other Ambulatory Visit: Payer: Self-pay | Admitting: Physician Assistant

## 2017-09-05 DIAGNOSIS — R002 Palpitations: Secondary | ICD-10-CM

## 2017-09-29 ENCOUNTER — Ambulatory Visit: Payer: BC Managed Care – PPO | Admitting: Cardiology

## 2017-10-01 ENCOUNTER — Other Ambulatory Visit: Payer: Self-pay | Admitting: Physician Assistant

## 2017-10-01 DIAGNOSIS — R002 Palpitations: Secondary | ICD-10-CM

## 2017-10-05 ENCOUNTER — Encounter: Payer: Self-pay | Admitting: Cardiology

## 2017-10-17 ENCOUNTER — Other Ambulatory Visit: Payer: Self-pay | Admitting: Physician Assistant

## 2017-10-25 ENCOUNTER — Other Ambulatory Visit: Payer: Self-pay

## 2017-10-25 DIAGNOSIS — R002 Palpitations: Secondary | ICD-10-CM

## 2017-10-25 MED ORDER — METOPROLOL SUCCINATE ER 25 MG PO TB24: 12.5000 mg | ORAL_TABLET | Freq: Every day | ORAL | 0 refills | Status: DC

## 2017-11-22 ENCOUNTER — Other Ambulatory Visit: Payer: Self-pay | Admitting: Physician Assistant

## 2017-11-22 DIAGNOSIS — R002 Palpitations: Secondary | ICD-10-CM

## 2017-11-27 ENCOUNTER — Other Ambulatory Visit: Payer: Self-pay | Admitting: Physician Assistant

## 2017-12-19 ENCOUNTER — Other Ambulatory Visit: Payer: Self-pay | Admitting: Internal Medicine

## 2017-12-19 DIAGNOSIS — R002 Palpitations: Secondary | ICD-10-CM

## 2017-12-23 ENCOUNTER — Other Ambulatory Visit: Payer: Self-pay | Admitting: Internal Medicine

## 2017-12-23 DIAGNOSIS — R002 Palpitations: Secondary | ICD-10-CM

## 2018-01-01 ENCOUNTER — Telehealth: Payer: Self-pay | Admitting: Internal Medicine

## 2018-01-01 ENCOUNTER — Other Ambulatory Visit: Payer: Self-pay | Admitting: Internal Medicine

## 2018-01-01 DIAGNOSIS — R002 Palpitations: Secondary | ICD-10-CM

## 2018-01-01 MED ORDER — METOPROLOL SUCCINATE ER 25 MG PO TB24
ORAL_TABLET | ORAL | 0 refills | Status: DC
Start: 2018-01-01 — End: 2018-01-02

## 2018-01-01 NOTE — Telephone Encounter (Signed)
Pt's medication was sent to pt's pharmacy as requested. Confirmation received.  °

## 2018-01-01 NOTE — Telephone Encounter (Signed)
New Message      *STAT* If patient is at the pharmacy, call can be transferred to refill team.   1. Which medications need to be refilled? (please list name of each medication and dose if known)  metoprolol succinate (TOPROL-XL) 25 MG 24 hr tablet TAKE 1/2 TABLET BY MOUTH EVERY DAY (NEEDS OV)     2. Which pharmacy/location (including street and city if local pharmacy) is medication to be sent to? [facility] [place]  3. Do they need a 30 day or 90 day supply?  Needs enough to get him to 3/21 appt

## 2018-01-02 ENCOUNTER — Other Ambulatory Visit: Payer: Self-pay

## 2018-01-02 DIAGNOSIS — R002 Palpitations: Secondary | ICD-10-CM

## 2018-01-02 MED ORDER — METOPROLOL SUCCINATE ER 25 MG PO TB24
ORAL_TABLET | ORAL | 0 refills | Status: DC
Start: 2018-01-02 — End: 2018-01-18

## 2018-01-16 NOTE — Progress Notes (Signed)
Cardiology Office Note:    Date:  01/18/2018   ID:  Francisco Pena, DOB [birth year], MRN [id number]  PCP:  Brunetta Jeans, PA-C  Cardiologist:  Dorris Carnes, MD  Referring MD: Brunetta Jeans, PA-C   Chief Complaint  Patient presents with  . Follow-up    History of Present Illness:    Francisco Pena is a 52 y.o. male with a past medical history significant for anxiety, hyperlipidemia, palpitations controlled with metoprolol, and OSA on CPAP. [name] was evaluated in 2016 for chest tightness and palpitations.  An echocardiogram demonstrated normal LV function.  An event monitor demonstrated normal sinus rhythm with occasional PACs.  The patient was placed on Toprol-XL 12.5 mg daily for control of symptoms.  At follow-up appointments he has denied recurrent palpitations.  [name] is here today for routine follow up and medication management. [name] [facility], 30-40 minutes on TM. He broke his left great toe about 3 weeks ago when he stumbled missing his last step on a flight of stairs and has not been exercising since.  He denies any chest pain/pressure with exertion or significant dyspnea on exertion.  He does have occasional increased work of breathing with climbing about 20 stairs at work.  He has not been bothered much by palpitations.  He has rare palpitation about every few months for which he takes an extra half metoprolol.  He drinks about 2 cups of coffee per day, no soda or tea.  He does have life stressors including "a crazy ex-wife".  He does take Celexa which he feels like helps him.  He uses CPAP consistently every night.   Past Medical History:  Diagnosis Date  . Allergy   . Anxiety and depression   . Hyperlipidemia   . Prostate infection     Past Surgical History:  Procedure Laterality Date  . SPINE SURGERY Right    disc shaved on right side  . TONSILLECTOMY      Current Medications: Current Meds  Medication Sig  . Ascorbic  Acid (VITAMIN C) 100 MG tablet Take 500 mg by mouth 2 (two) times daily.  Marland Kitchen atorvastatin (LIPITOR) 20 MG tablet Take 1 tablet (20 mg total) by mouth daily at 6 PM.  . citalopram (CELEXA) 40 MG tablet TAKE 1/2 TABLET BY MOUTH DAILY  . gabapentin (NEURONTIN) 300 MG capsule Take 1 capsule (300 mg total) by mouth at bedtime.  Marland Kitchen HYDROcodone-acetaminophen (NORCO/VICODIN) 5-325 MG tablet Take 1 tablet by mouth every 6 (six) hours as needed for moderate pain.  . metoprolol succinate (TOPROL-XL) 25 MG 24 hr tablet TAKE 1/2 TABLET BY MOUTH EVERY DAY. Please keep upcoming appt for future refills. Thank you  . Multiple Vitamin (MULTIVITAMIN) tablet Take 1 tablet by mouth daily.     Allergies:   Augmentin [amoxicillin-pot clavulanate]   Social History   Socioeconomic History  . Marital status: Married    Spouse name: Not on file  . Number of children: Not on file  . Years of education: Not on file  . Highest education level: Not on file  Occupational History  . Not on file  Social Needs  . Financial resource strain: Not on file  . Food insecurity:    Worry: Not on file    Inability: Not on file  . Transportation needs:    Medical: Not on file    Non-medical: Not on file  Tobacco Use  . Smoking status:  Never Smoker  . Smokeless tobacco: Never Used  Substance and Sexual Activity  . Alcohol use: No    Alcohol/week: 0.0 oz  . Drug use: No  . Sexual activity: Yes    Partners: Female  Lifestyle  . Physical activity:    Days per week: Not on file    Minutes per session: Not on file  . Stress: Not on file  Relationships  . Social connections:    Talks on phone: Not on file    Gets together: Not on file    Attends religious service: Not on file    Active member of club or organization: Not on file    Attends meetings of clubs or organizations: Not on file    Relationship status: Not on file  Other Topics Concern  . 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     Family History: The patient's family history includes Heart disease in his father; Hypertension in his father, mother, and sister. ROS:   Please see the history of present illness.     All other systems reviewed and are negative.  EKGs/Labs/Other Studies Reviewed:    The following studies were reviewed today:  Echocardiogram 02/05/2015 Study Conclusions - Left ventricle: The cavity size was normal. Systolic function was normal. The estimated ejection fraction was in the range of 55% to 60%. Wall motion was normal; there were no regional wall motion abnormalities. Left ventricular diastolic function parameters were normal. - Aortic valve: There was trivial regurgitation. - Ascending aorta: The ascending aorta was normal in size. - Mitral valve: Structurally normal valve. - Left atrium: The atrium was normal in size. - Right ventricle: The cavity size was normal. Wall thickness was normal. Systolic function was normal. - Right atrium: The atrium was normal in size. - Tricuspid valve: There was mild regurgitation. - Pulmonary arteries: Systolic pressure was within the normal range. - Inferior vena cava: The vessel was normal in size. - Pericardium, extracardiac: There was no pericardial effusion. Impressions: - Mild TR, otherwise normal study.  Event monitor 4/16 NSR with occasional PACs   EKG:  EKG is ordered today.  The ekg ordered today demonstrates normal sinus rhythm with right bundle branch block, 63 bpm, unchanged from previous  Recent Labs: No results found for requested labs within last 8760 hours.   Recent Lipid Panel    Component Value Date/Time   CHOL 169 05/05/2017 0933   TRIG 212.0 (H) 05/05/2017 0933   HDL 49.90 05/05/2017 0933   CHOLHDL 3 05/05/2017 0933   VLDL 42.4 (H) 05/05/2017 0933   LDLDIRECT 87.0 05/05/2017 0933    Physical Exam:    VS:   Ht 6\' 2"  (1.88 m)   BMI 28.17 kg/m     Wt Readings from Last 3 Encounters:  07/26/17 219 lb 6.4 oz (99.5 kg)  05/05/17 215 lb (97.5 kg)  03/02/17 219 lb 3.2 oz (99.4 kg)     Physical Exam  Constitutional: He is oriented to person, place, and time. He appears well-developed and well-nourished. No distress.  HENT:  Head: Normocephalic and atraumatic.  Neck: Normal range of motion. Neck supple. No JVD present. No thyromegaly present.  Cardiovascular: Normal rate, regular rhythm, normal heart sounds and intact distal pulses. Exam reveals no gallop and no friction rub.  No murmur heard. Pulmonary/Chest: Effort normal and breath sounds  normal. No respiratory distress. He has no wheezes. He has no rales. He exhibits no tenderness.  Abdominal: Soft. Bowel sounds are normal. He exhibits no distension. There is no tenderness.  Musculoskeletal: Normal range of motion. He exhibits no edema.  Tenderness to left great toe, recent fracture  Neurological: He is alert and oriented to person, place, and time.  Skin: Skin is warm and dry.  Psychiatric: He has a normal mood and affect. His behavior is normal. Thought content normal.   ASSESSMENT:    1. Hyperlipidemia LDL goal <130   2. Obstructive sleep apnea   3. Palpitations    PLAN:    In order of problems listed above:  Hyperlipidemia: On atorvastatin 20 mg daily being managed by PCP.  LDL of 87, HDL 49.9 in 04/2017.  Well controlled.  Continue current therapy.  Obstructive sleep apnea: Mild by home sleep test in 02/2017.  Using CPAP.  Followed by Dr. Radford Pax.   Palpitations: Well controlled with metoprolol XL 25 mg daily with an occasional extra dose every few months for breakthrough palpitations.  Continue current therapy.     Medication Adjustments/Labs and Tests Ordered: Current medicines are reviewed at length with the patient today.  Concerns regarding medicines are outlined above. Labs and tests ordered and medication changes are  outlined in the patient instructions below:  There are no Patient Instructions on file for this visit.   Signed, Daune Perch, NP  01/18/2018 8:26 AM    [facility]

## 2018-01-17 ENCOUNTER — Encounter: Payer: Self-pay | Admitting: Emergency Medicine

## 2018-01-17 ENCOUNTER — Other Ambulatory Visit: Payer: Self-pay | Admitting: Emergency Medicine

## 2018-01-17 MED ORDER — ATORVASTATIN CALCIUM 20 MG PO TABS: 20.0000 mg | ORAL_TABLET | Freq: Every day | ORAL | 0 refills | Status: DC

## 2018-01-18 ENCOUNTER — Ambulatory Visit (INDEPENDENT_AMBULATORY_CARE_PROVIDER_SITE_OTHER): Payer: BC Managed Care – PPO | Admitting: Cardiology

## 2018-01-18 ENCOUNTER — Encounter: Payer: Self-pay | Admitting: Cardiology

## 2018-01-18 VITALS — BP 122/74 | HR 63 | Ht 74.0 in | Wt 223.8 lb

## 2018-01-18 DIAGNOSIS — R002 Palpitations: Secondary | ICD-10-CM | POA: Diagnosis not present

## 2018-01-18 DIAGNOSIS — E785 Hyperlipidemia, unspecified: Secondary | ICD-10-CM | POA: Diagnosis not present

## 2018-01-18 DIAGNOSIS — G4733 Obstructive sleep apnea (adult) (pediatric): Secondary | ICD-10-CM

## 2018-01-18 MED ORDER — METOPROLOL SUCCINATE ER 25 MG PO TB24: ORAL_TABLET | ORAL | 3 refills | Status: DC

## 2018-01-18 NOTE — Patient Instructions (Addendum)
Medication Instructions:  Your physician recommends that you continue on your current medications as directed. Please refer to the Current Medication list given to you today.   Labwork: TODAY:  Testing/Procedures: None ordered  Follow-Up: Your physician wants you to follow-up in: [place] DR. ROSS   You will receive a reminder letter in the mail two months in advance. If you don't receive a letter, please call our office to schedule the follow-up appointment.   Any Other Special Instructions Will Be Listed Below (If Applicable).     If you need a refill on your cardiac medications before your next appointment, please call your pharmacy.

## 2018-01-31 ENCOUNTER — Telehealth: Payer: Self-pay | Admitting: Internal Medicine

## 2018-01-31 NOTE — Telephone Encounter (Signed)
lmtcb for pt. Pt will need to be scheduled for a rov- insurance requires pt be seen within 6 months before DME company can be switched.

## 2018-01-31 NOTE — Telephone Encounter (Signed)
Spoke with pt, who is requesting to switch from [facility] to another DME.  Pt does not have a preference on companies.   Pt scheduled for an OV as he has not been seen in over 6 months, and will not be able to switch companies until he is seen in clinic.  Nothing further needed at this time.

## 2018-01-31 NOTE — Telephone Encounter (Signed)
Pt is calling back [phone]

## 2018-02-19 ENCOUNTER — Ambulatory Visit: Payer: BC Managed Care – PPO | Admitting: Acute Care

## 2018-02-23 ENCOUNTER — Other Ambulatory Visit: Payer: Self-pay | Admitting: Physician Assistant

## 2018-02-23 NOTE — Telephone Encounter (Signed)
Last OV: 01/2017

## 2018-02-27 ENCOUNTER — Other Ambulatory Visit: Payer: Self-pay | Admitting: Physician Assistant

## 2018-03-16 ENCOUNTER — Other Ambulatory Visit: Payer: Self-pay | Admitting: Physician Assistant

## 2018-04-03 ENCOUNTER — Ambulatory Visit: Payer: BC Managed Care – PPO | Admitting: Physician Assistant

## 2018-04-03 ENCOUNTER — Encounter: Payer: Self-pay | Admitting: Physician Assistant

## 2018-04-03 ENCOUNTER — Other Ambulatory Visit: Payer: Self-pay

## 2018-04-03 VITALS — BP 118/70 | HR 70 | Temp 98.3°F | Resp 16 | Ht 74.0 in | Wt 223.0 lb

## 2018-04-03 DIAGNOSIS — B9689 Other specified bacterial agents as the cause of diseases classified elsewhere: Secondary | ICD-10-CM

## 2018-04-03 DIAGNOSIS — J208 Acute bronchitis due to other specified organisms: Secondary | ICD-10-CM | POA: Diagnosis not present

## 2018-04-03 MED ORDER — AZITHROMYCIN 250 MG PO TABS: ORAL_TABLET | ORAL | 0 refills | Status: DC

## 2018-04-03 MED ORDER — BENZONATATE 100 MG PO CAPS: 100.0000 mg | ORAL_CAPSULE | Freq: Two times a day (BID) | ORAL | 0 refills | Status: DC | PRN

## 2018-04-03 NOTE — Progress Notes (Signed)
Patient presents to clinic today c/o productive cough x > 3 weeks. Associated symptoms include thick greens sputum, fatigue. Denies sinus pain, ear pain, tooth pain, fever, chills, chest pain or SOB. Was seen at [facility] 3 weeks ago and given Doxycycline and a steroid burst. Notes he improved greatly while on medication but symptoms recurred after completion.    Past Medical History:  Diagnosis Date  . Allergy   . Anxiety and depression   . Hyperlipidemia   . Prostate infection     Current Outpatient Medications on File Prior to Visit  Medication Sig Dispense Refill  . atorvastatin (LIPITOR) 20 MG tablet Take 1 tablet (20 mg total) by mouth daily at 6 PM. 30 tablet 0  . citalopram (CELEXA) 40 MG tablet TAKE 1/2 TABLET BY MOUTH DAILY 90 tablet 0  . gabapentin (NEURONTIN) 300 MG capsule Take 1 capsule (300 mg total) by mouth at bedtime. 30 capsule 5  . metoprolol succinate (TOPROL-XL) 25 MG 24 hr tablet TAKE 1/2 TABLET BY MOUTH EVERY DAY. 45 tablet 3  . Ascorbic Acid (VITAMIN C) 100 MG tablet Take 500 mg by mouth 2 (two) times daily.     No current facility-administered medications on file prior to visit.     Allergies  Allergen Reactions  . Augmentin [Amoxicillin-Pot Clavulanate] Diarrhea    Family History  Problem Relation Age of Onset  . Hypertension Mother   . Hypertension Father   . Heart disease Father        CHF   . Hypertension Sister     Social History   Socioeconomic History  . Marital status: Married    Spouse name: Not on file  . Number of children: 4  . Years of education: Not on file  . Highest education level: Not on file  Occupational History  . Occupation: Pharmacist, hospital  Social Needs  . Financial resource strain: Not on file  . Food insecurity:    Worry: Not on file    Inability: Not on file  . Transportation needs:    Medical: Not on file    Non-medical: Not on file  Tobacco Use  . Smoking status: Never Smoker  . Smokeless tobacco: Never Used    Substance and Sexual Activity  . Alcohol use: No    Alcohol/week: 0.0 oz  . Drug use: No  . Sexual activity: Yes    Partners: Female  Lifestyle  . Physical activity:    Days per week: 4 days    Minutes per session: 40 min  . Stress: Not on file  Relationships  . Social connections:    Talks on phone: Not on file    Gets together: Not on file    Attends religious service: Not on file    Active member of club or organization: Not on file    Attends meetings of clubs or organizations: Not on file    Relationship status: Not on file  Other Topics Concern  . 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   Review of Systems - See HPI.  All other ROS are negative.  BP 118/70   Pulse 70   Temp 98.3 F (36.8 C) (Oral)   Resp 16   Wt 223 lb (101.2 kg)   SpO2 98%   BMI 28.63 kg/m   Physical Exam  Constitutional: He is  oriented to person, place, and time. He appears well-developed and well-nourished.  HENT:  Head: Normocephalic and atraumatic.  Right Ear: Tympanic membrane and external ear normal.  Left Ear: Tympanic membrane and external ear normal.  Nose: Nose normal.  Mouth/Throat: Oropharynx is clear and moist.  Eyes: Conjunctivae are normal.  Neck: Neck supple.  Cardiovascular: Normal rate, regular rhythm and normal heart sounds.  Pulmonary/Chest: Effort normal and breath sounds normal. No stridor. No respiratory distress. He has no wheezes. He has no rales. He exhibits no tenderness.  Lymphadenopathy:    He has no cervical adenopathy.  Neurological: He is alert and oriented to person, place, and time.  Psychiatric: He has a normal mood and affect.  Vitals reviewed.  Assessment/Plan: 1. Acute bacterial bronchitis Rx Azithromycin.  Increase fluids.  Rest.  Saline nasal spray.  Probiotic.  Mucinex as directed.  Humidifier in bedroom. Tessalon per orders.   Call or return to clinic if symptoms are not improving.  - benzonatate (TESSALON) 100 MG capsule; Take 1 capsule (100 mg total) by mouth 2 (two) times daily as needed for cough.  Dispense: 20 capsule; Refill: 0 - azithromycin (ZITHROMAX) 250 MG tablet; Take 2 tablets on Day 1. Then take 1 tablet daily  Dispense: 6 tablet; Refill: 0   Leeanne Rio, PA-C

## 2018-04-03 NOTE — Patient Instructions (Signed)
Take antibiotic (Azithromycin) as directed.  Increase fluids.  Get plenty of rest. Use Mucinex for congestion. Use the Tessalon as directed as well for cough. Stop the promethazine-dm. Take a daily probiotic (I recommend Align or Culturelle, but even Activia Yogurt may be beneficial).  A humidifier placed in the bedroom may offer some relief for a dry, scratchy throat of nasal irritation.  Read information below on acute bronchitis. Please call or return to clinic if symptoms are not improving.  Acute Bronchitis Bronchitis is when the airways that extend from the windpipe into the lungs get red, puffy, and painful (inflamed). Bronchitis often causes thick spit (mucus) to develop. This leads to a cough. A cough is the most common symptom of bronchitis. In acute bronchitis, the condition usually begins suddenly and goes away over time (usually in 2 weeks). Smoking, allergies, and asthma can make bronchitis worse. Repeated episodes of bronchitis may cause more lung problems.  HOME CARE  Rest.  Drink enough fluids to keep your pee (urine) clear or pale yellow (unless you need to limit fluids as told by your doctor).  Only take over-the-counter or prescription medicines as told by your doctor.  Avoid smoking and secondhand smoke. These can make bronchitis worse. If you are a smoker, think about using nicotine gum or skin patches. Quitting smoking will help your lungs heal faster.  Reduce the chance of getting bronchitis again by:  Washing your hands often.  Avoiding people with cold symptoms.  Trying not to touch your hands to your mouth, nose, or eyes.  Follow up with your doctor as told.  GET HELP IF: Your symptoms do not improve after 1 week of treatment. Symptoms include:  Cough.  Fever.  Coughing up thick spit.  Body aches.  Chest congestion.  Chills.  Shortness of breath.  Sore throat.  GET HELP RIGHT AWAY IF:   You have an increased fever.  You have chills.  You  have severe shortness of breath.  You have bloody thick spit (sputum).  You throw up (vomit) often.  You lose too much body fluid (dehydration).  You have a severe headache.  You faint.  MAKE SURE YOU:   Understand these instructions.  Will watch your condition.  Will get help right away if you are not doing well or get worse. Document Released: 04/04/2008 Document Revised: 06/19/2013 Document Reviewed: 04/09/2013 [facility] Patient Information 2015 [place], [place]. This information is not intended to replace advice given to you by your health care provider. Make sure you discuss any questions you have with your health care provider.

## 2018-04-23 ENCOUNTER — Other Ambulatory Visit: Payer: Self-pay | Admitting: Physician Assistant

## 2018-04-23 ENCOUNTER — Encounter: Payer: Self-pay | Admitting: Emergency Medicine

## 2018-04-28 ENCOUNTER — Other Ambulatory Visit: Payer: Self-pay | Admitting: Physician Assistant

## 2018-05-18 ENCOUNTER — Other Ambulatory Visit: Payer: Self-pay | Admitting: Physician Assistant

## 2018-05-22 ENCOUNTER — Other Ambulatory Visit: Payer: Self-pay | Admitting: Physician Assistant

## 2018-05-26 ENCOUNTER — Other Ambulatory Visit: Payer: Self-pay | Admitting: Physician Assistant

## 2018-05-29 ENCOUNTER — Ambulatory Visit: Payer: BC Managed Care – PPO | Admitting: Physician Assistant

## 2018-05-29 ENCOUNTER — Encounter: Payer: Self-pay | Admitting: Physician Assistant

## 2018-05-29 ENCOUNTER — Other Ambulatory Visit: Payer: Self-pay

## 2018-05-29 VITALS — BP 120/82 | HR 66 | Temp 98.6°F | Resp 15 | Ht 74.0 in | Wt 222.8 lb

## 2018-05-29 DIAGNOSIS — H8113 Benign paroxysmal vertigo, bilateral: Secondary | ICD-10-CM | POA: Diagnosis not present

## 2018-05-29 DIAGNOSIS — E785 Hyperlipidemia, unspecified: Secondary | ICD-10-CM

## 2018-05-29 DIAGNOSIS — Z1211 Encounter for screening for malignant neoplasm of colon: Secondary | ICD-10-CM

## 2018-05-29 DIAGNOSIS — F329 Major depressive disorder, single episode, unspecified: Secondary | ICD-10-CM

## 2018-05-29 DIAGNOSIS — F419 Anxiety disorder, unspecified: Secondary | ICD-10-CM

## 2018-05-29 LAB — COMPREHENSIVE METABOLIC PANEL
ALK PHOS: 56 U/L (ref 39–117)
ALT: 16 U/L (ref 0–53)
AST: 16 U/L (ref 0–37)
Albumin: 4.5 g/dL (ref 3.5–5.2)
BUN: 16 mg/dL (ref 6–23)
CHLORIDE: 103 meq/L (ref 96–112)
CO2: 31 meq/L (ref 19–32)
Calcium: 9.6 mg/dL (ref 8.4–10.5)
Creatinine, Ser: 1.14 mg/dL (ref 0.40–1.50)
GFR: 72.27 mL/min (ref >60.00)
GLUCOSE: 91 mg/dL (ref 70–99)
POTASSIUM: 4.2 meq/L (ref 3.5–5.1)
SODIUM: 140 meq/L (ref 135–145)
TOTAL PROTEIN: 6.5 g/dL (ref 6.0–8.3)
Total Bilirubin: 2.3 mg/dL — ABNORMAL HIGH (ref 0.2–1.2)

## 2018-05-29 LAB — LDL CHOLESTEROL, DIRECT: Direct LDL: 69 mg/dL

## 2018-05-29 LAB — LIPID PANEL
CHOL/HDL RATIO: 3
Cholesterol: 148 mg/dL (ref 0–200)
HDL: 48.6 mg/dL (ref >39.00)
NONHDL: 99.01
TRIGLYCERIDES: 206 mg/dL — AB (ref 0.0–149.0)
VLDL: 41.2 mg/dL — AB (ref 0.0–40.0)

## 2018-05-29 MED ORDER — TIZANIDINE HCL 4 MG PO TABS: 4.0000 mg | ORAL_TABLET | Freq: Four times a day (QID) | ORAL | 0 refills | Status: DC | PRN

## 2018-05-29 NOTE — Progress Notes (Signed)
Patient presents to clinic today c/o intermittent headaches over the past week associated with increased stressors and tension in shoulders and neck. Patient endorses significant stressors over the past month, worsening in the past couple of weeks. Is having affect on stress and anxiety levels as well as causing more of a depressed mood. Patient endorses taking his 20 mg Citalopram daily as directed but notes it does not seem to be as effective. Patient also noted some episodes of feeling off-balance if he is bent over and rises quickly. Denies true vertigo. Denies N/V, vision changes, change in energy levels or mentation. Denies any recent or ongoing URI symptoms.   Patient is also overdue for repeat labs to assess cholesterol. He is fasting and would like done today.  Past Medical History:  Diagnosis Date  . Allergy   . Anxiety and depression   . Hyperlipidemia   . Prostate infection     Current Outpatient Medications on File Prior to Visit  Medication Sig Dispense Refill  . Ascorbic Acid (VITAMIN C) 100 MG tablet Take 500 mg by mouth 2 (two) times daily.    Marland Kitchen atorvastatin (LIPITOR) 20 MG tablet TAKE 1 TABLET (20 MG TOTAL) BY MOUTH DAILY AT 6 PM. 30 tablet 0  . benzonatate (TESSALON) 100 MG capsule Take 1 capsule (100 mg total) by mouth 2 (two) times daily as needed for cough. 20 capsule 0  . citalopram (CELEXA) 40 MG tablet TAKE 1/2 TABLET BY MOUTH DAILY. No More Refills until Appointment. Schedule a Physical 45 tablet 0  . gabapentin (NEURONTIN) 300 MG capsule Take 1 capsule (300 mg total) by mouth at bedtime. 30 capsule 5  . metoprolol succinate (TOPROL-XL) 25 MG 24 hr tablet TAKE 1/2 TABLET BY MOUTH EVERY DAY. 45 tablet 3   No current facility-administered medications on file prior to visit.     Allergies  Allergen Reactions  . Augmentin [Amoxicillin-Pot Clavulanate] Diarrhea    Family History  Problem Relation Age of Onset  . Hypertension Mother   . Hypertension Father   .  Heart disease Father        CHF   . Hypertension Sister     Social History   Socioeconomic History  . Marital status: Married    Spouse name: Not on file  . Number of children: 4  . Years of education: Not on file  . Highest education level: Not on file  Occupational History  . Occupation: Pharmacist, hospital  Social Needs  . Financial resource strain: Not on file  . Food insecurity:    Worry: Not on file    Inability: Not on file  . Transportation needs:    Medical: Not on file    Non-medical: Not on file  Tobacco Use  . Smoking status: Never Smoker  . Smokeless tobacco: Never Used  Substance and Sexual Activity  . Alcohol use: No    Alcohol/week: 0.0 oz  . Drug use: No  . Sexual activity: Yes    Partners: Female  Lifestyle  . Physical activity:    Days per week: 4 days    Minutes per session: 40 min  . Stress: Not on file  Relationships  . Social connections:    Talks on phone: Not on file    Gets together: Not on file    Attends religious service: Not on file    Active member of club or organization: Not on file    Attends meetings of clubs or organizations: Not on file  Relationship status: Not on file  Other Topics Concern  . 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   Review of Systems - See HPI.  All other ROS are negative.  BP 120/82   Pulse 66   Temp 98.6 F (37 C) (Oral)   Resp 15   Ht '6\' 2"'  (1.88 m)   Wt 222 lb 12.8 oz (101.1 kg)   SpO2 95%   BMI 28.61 kg/m   Physical Exam  Constitutional: He is oriented to person, place, and time. He appears well-developed and well-nourished.  HENT:  Head: Normocephalic and atraumatic.  Right Ear: Tympanic membrane normal.  Left Ear: Tympanic membrane normal.  Nose: Nose normal.  Mouth/Throat: Uvula is midline and oropharynx is clear and moist.  Eyes: Pupils are equal, round, and reactive to  light. EOM are normal.  Neck: Neck supple.  Cardiovascular: Normal rate, regular rhythm and normal heart sounds.  Pulmonary/Chest: Effort normal.  Neurological: He is alert and oriented to person, place, and time.  Psychiatric: He has a normal mood and affect.  Vitals reviewed.  Assessment/Plan: 1. Hyperlipidemia LDL goal <130 Repeat fasting lipids and CMP. Diet and exercise recommendations reviewed. Will refill statin. - Lipid Profile - Comp Met (CMET)  2. Colon cancer screening Referral to GI placed for screening colonoscopy. - Ambulatory referral to Gastroenterology  3. Anxiety and depression Increase Citalopram to 40 mg daily. Rx Tizanidine to help with tension headaches and neck pain stemming from stress. Supportive measures reviewed. Close follow-up scheduled.   4. Benign paroxysmal positional vertigo due to bilateral vestibular disorder Mild. Supportive measures reviewed. OTC meclizine discussed. Start home E[place] exercises. Follow-up if not resolving.    Leeanne Rio, PA-C

## 2018-05-29 NOTE — Patient Instructions (Addendum)
Please keep well-hydrated and get plenty of rest.  Limit salt intake.  Use the Tizanidine as directed when needed to help with neck tension/tension headache. Start the home maneuvers to help with positional vertigo. Increase Citalopram to 40 mg (1 whole tablet) daily. Keep working on stress relief tactics.   Please go to the lab today for blood work.  I will call you with your results. We will alter treatment regimen(s) if indicated by your results.   You will also be contacted to schedule your colonoscopy. Follow-up with me in 2 weeks for reassessment. We can do your physical at that time.     How to Perform the Epley Maneuver The Epley maneuver is an exercise that relieves symptoms of vertigo. Vertigo is the feeling that you or your surroundings are moving when they are not. When you feel vertigo, you may feel like the room is spinning and have trouble walking. Dizziness is a little different than vertigo. When you are dizzy, you may feel unsteady or light-headed. You can do this maneuver at home whenever you have symptoms of vertigo. You can do it up to 3 times a day until your symptoms go away. Even though the Epley maneuver may relieve your vertigo for a few weeks, it is possible that your symptoms will return. This maneuver relieves vertigo, but it does not relieve dizziness. What are the risks? If it is done correctly, the Epley maneuver is considered safe. Sometimes it can lead to dizziness or nausea that goes away after a short time. If you develop other symptoms, such as changes in vision, weakness, or numbness, stop doing the maneuver and call your health care provider. How to perform the Epley maneuver 1. Sit on the edge of a bed or table with your back straight and your legs extended or hanging over the edge of the bed or table. 2. Turn your head halfway toward the affected ear or side. 3. Lie backward quickly with your head turned until you are lying flat on your back. You may  want to position a pillow under your shoulders. 4. Hold this position for 30 seconds. You may experience an attack of vertigo. This is normal. 5. Turn your head to the opposite direction until your unaffected ear is facing the floor. 6. Hold this position for 30 seconds. You may experience an attack of vertigo. This is normal. Hold this position until the vertigo stops. 7. Turn your whole body to the same side as your head. Hold for another 30 seconds. 8. Sit back up. You can repeat this exercise up to 3 times a day. Follow these instructions at home:  After doing the Epley maneuver, you can return to your normal activities.  Ask your health care provider if there is anything you should do at home to prevent vertigo. He or she may recommend that you: ? Keep your head raised (elevated) with two or more pillows while you sleep. ? Do not sleep on the side of your affected ear. ? Get up slowly from bed. ? Avoid sudden movements during the day. ? Avoid extreme head movement, like looking up or bending over. Contact a health care provider if:  Your vertigo gets worse.  You have other symptoms, including: ? Nausea. ? Vomiting. ? Headache. Get help right away if:  You have vision changes.  You have a severe or worsening headache or neck pain.  You cannot stop vomiting.  You have new numbness or weakness in any part of  your body. Summary  Vertigo is the feeling that you or your surroundings are moving when they are not.  The Epley maneuver is an exercise that relieves symptoms of vertigo.  If the Epley maneuver is done correctly, it is considered safe. You can do it up to 3 times a day. This information is not intended to replace advice given to you by your health care provider. Make sure you discuss any questions you have with your health care provider. Document Released: 10/22/2013 Document Revised: 09/06/2016 Document Reviewed: 09/06/2016 Elsevier Interactive Patient Education   2017 [employer].

## 2018-05-30 ENCOUNTER — Other Ambulatory Visit: Payer: Self-pay

## 2018-05-30 DIAGNOSIS — R17 Unspecified jaundice: Secondary | ICD-10-CM

## 2018-05-31 ENCOUNTER — Telehealth: Payer: Self-pay | Admitting: Emergency Medicine

## 2018-05-31 ENCOUNTER — Other Ambulatory Visit (INDEPENDENT_AMBULATORY_CARE_PROVIDER_SITE_OTHER): Payer: BC Managed Care – PPO

## 2018-05-31 DIAGNOSIS — R17 Unspecified jaundice: Secondary | ICD-10-CM | POA: Diagnosis not present

## 2018-05-31 DIAGNOSIS — E785 Hyperlipidemia, unspecified: Secondary | ICD-10-CM

## 2018-05-31 LAB — HEPATIC FUNCTION PANEL
ALBUMIN: 4.5 g/dL (ref 3.5–5.2)
ALK PHOS: 56 U/L (ref 39–117)
ALT: 16 U/L (ref 0–53)
AST: 16 U/L (ref 0–37)
Bilirubin, Direct: 0.3 mg/dL (ref 0.0–0.3)
TOTAL PROTEIN: 6.4 g/dL (ref 6.0–8.3)
Total Bilirubin: 2.1 mg/dL — ABNORMAL HIGH (ref 0.2–1.2)

## 2018-05-31 MED ORDER — ATORVASTATIN CALCIUM 20 MG PO TABS: 20.0000 mg | ORAL_TABLET | Freq: Every day | ORAL | 1 refills | Status: DC

## 2018-05-31 NOTE — Telephone Encounter (Signed)
Patient was in for lab visit this morning, Refilled request granted. Rx sent to preferred pharmacy.   Copied from [place] [id number]. Topic: General - Other >> May 31, 2018  9:05 AM Carolyn Stare wrote: Pt could not get a refill until he came in to see the doctor and was in on 05/29/18.Marland Kitchen   pt req refill    atorvastatin (LIPITOR) 20 MG tablet  [place]

## 2018-06-04 ENCOUNTER — Other Ambulatory Visit: Payer: Self-pay

## 2018-06-04 ENCOUNTER — Telehealth: Payer: Self-pay

## 2018-06-04 DIAGNOSIS — R17 Unspecified jaundice: Secondary | ICD-10-CM

## 2018-06-04 NOTE — Telephone Encounter (Signed)
Patient wants a call first thing in the morning because he has concerns. Please advise.

## 2018-06-05 ENCOUNTER — Other Ambulatory Visit (INDEPENDENT_AMBULATORY_CARE_PROVIDER_SITE_OTHER): Payer: BC Managed Care – PPO

## 2018-06-05 ENCOUNTER — Other Ambulatory Visit: Payer: Self-pay | Admitting: *Deleted

## 2018-06-05 DIAGNOSIS — R17 Unspecified jaundice: Secondary | ICD-10-CM

## 2018-06-05 NOTE — Telephone Encounter (Signed)
Spoke with patient this AM before morning clinic. He came in today for labs. Reassurance given. Will call with lab results.

## 2018-06-06 ENCOUNTER — Other Ambulatory Visit: Payer: BC Managed Care – PPO

## 2018-06-06 ENCOUNTER — Encounter: Payer: Self-pay | Admitting: Physician Assistant

## 2018-06-06 ENCOUNTER — Other Ambulatory Visit: Payer: Self-pay

## 2018-06-06 ENCOUNTER — Ambulatory Visit: Payer: BC Managed Care – PPO | Admitting: Physician Assistant

## 2018-06-06 ENCOUNTER — Ambulatory Visit: Payer: Self-pay | Admitting: *Deleted

## 2018-06-06 VITALS — BP 118/76 | HR 74 | Temp 97.4°F | Ht 74.0 in | Wt 224.0 lb

## 2018-06-06 DIAGNOSIS — R42 Dizziness and giddiness: Secondary | ICD-10-CM

## 2018-06-06 LAB — REFLEX TIQ

## 2018-06-06 LAB — ACUTE HEP PANEL AND HEP B SURFACE AB
HEP B C IGM: NONREACTIVE
HEPATITIS C ANTIBODY REFILL$(REFL): NONREACTIVE
Hep A IgM: NONREACTIVE
Hepatitis B Surface Ag: NONREACTIVE
SIGNAL TO CUT-OFF: 0.01 (ref <1.00)

## 2018-06-06 MED ORDER — MECLIZINE HCL 25 MG PO TABS: 25.0000 mg | ORAL_TABLET | Freq: Three times a day (TID) | ORAL | 0 refills | Status: DC | PRN

## 2018-06-06 NOTE — Patient Instructions (Signed)
Please keep well-hydrated and get plenty of rest. Limit salt-intake.  Use the tizanidine at night to help neck muscular tension that can contribute to vertigo.  Use the meclizine as directed to help with any vertigo episodes.   Start the Epley maneuvers as directed.  I have attached them below.  Follow-up with me on Friday for reassessment.  How to Perform the Epley Maneuver The Epley maneuver is an exercise that relieves symptoms of vertigo. Vertigo is the feeling that you or your surroundings are moving when they are not. When you feel vertigo, you may feel like the room is spinning and have trouble walking. Dizziness is a little different than vertigo. When you are dizzy, you may feel unsteady or light-headed. You can do this maneuver at home whenever you have symptoms of vertigo. You can do it up to 3 times a day until your symptoms go away. Even though the Epley maneuver may relieve your vertigo for a few weeks, it is possible that your symptoms will return. This maneuver relieves vertigo, but it does not relieve dizziness. What are the risks? If it is done correctly, the Epley maneuver is considered safe. Sometimes it can lead to dizziness or nausea that goes away after a short time. If you develop other symptoms, such as changes in vision, weakness, or numbness, stop doing the maneuver and call your health care provider. How to perform the Epley maneuver 1. Sit on the edge of a bed or table with your back straight and your legs extended or hanging over the edge of the bed or table. 2. Turn your head halfway toward the affected ear or side. 3. Lie backward quickly with your head turned until you are lying flat on your back. You may want to position a pillow under your shoulders. 4. Hold this position for 30 seconds. You may experience an attack of vertigo. This is normal. 5. Turn your head to the opposite direction until your unaffected ear is facing the floor. 6. Hold this position for 30  seconds. You may experience an attack of vertigo. This is normal. Hold this position until the vertigo stops. 7. Turn your whole body to the same side as your head. Hold for another 30 seconds. 8. Sit back up. You can repeat this exercise up to 3 times a day. Follow these instructions at home:  After doing the Epley maneuver, you can return to your normal activities.  Ask your health care provider if there is anything you should do at home to prevent vertigo. He or she may recommend that you: ? Keep your head raised (elevated) with two or more pillows while you sleep. ? Do not sleep on the side of your affected ear. ? Get up slowly from bed. ? Avoid sudden movements during the day. ? Avoid extreme head movement, like looking up or bending over. Contact a health care provider if:  Your vertigo gets worse.  You have other symptoms, including: ? Nausea. ? Vomiting. ? Headache. Get help right away if:  You have vision changes.  You have a severe or worsening headache or neck pain.  You cannot stop vomiting.  You have new numbness or weakness in any part of your body. Summary  Vertigo is the feeling that you or your surroundings are moving when they are not.  The Epley maneuver is an exercise that relieves symptoms of vertigo.  If the Epley maneuver is done correctly, it is considered safe. You can do it up to  3 times a day. This information is not intended to replace advice given to you by your health care provider. Make sure you discuss any questions you have with your health care provider. Document Released: 10/22/2013 Document Revised: 09/06/2016 Document Reviewed: 09/06/2016 Elsevier Interactive Patient Education  2017 [employer]. .

## 2018-06-06 NOTE — Progress Notes (Signed)
Patient presents to clinic today c/o episode of vertigo noted early this morning around 2 AM. Notes dizziness was associated with left-sided ear pressure and some tingling of his face around the L ear. States he relaxed for a while and symptoms resolved. Went back to sleep before 4 AM. When he arose at 8AM the only symptom present was a mild L earache. Denies any AMS, vision changes, focal weakness. Has history of BPPV, recently recommended to start Epley maneuvers. Has not done this. Denies any URI symptoms, fever, chills, nausea or vomiting. Has been feeling somewhat tired today with occasional dizziness with positional change. Otherwise feels normal.   Past Medical History:  Diagnosis Date  . Allergy   . Anxiety and depression   . Hyperlipidemia   . Prostate infection     Current Outpatient Medications on File Prior to Visit  Medication Sig Dispense Refill  . Ascorbic Acid (VITAMIN C) 100 MG tablet Take 500 mg by mouth 2 (two) times daily.    Marland Kitchen atorvastatin (LIPITOR) 20 MG tablet Take 1 tablet (20 mg total) by mouth daily at 6 PM. 90 tablet 1  . benzonatate (TESSALON) 100 MG capsule Take 1 capsule (100 mg total) by mouth 2 (two) times daily as needed for cough. 20 capsule 0  . citalopram (CELEXA) 40 MG tablet TAKE 1/2 TABLET BY MOUTH DAILY. No More Refills until Appointment. Schedule a Physical 45 tablet 0  . gabapentin (NEURONTIN) 300 MG capsule Take 1 capsule (300 mg total) by mouth at bedtime. 30 capsule 5  . metoprolol succinate (TOPROL-XL) 25 MG 24 hr tablet TAKE 1/2 TABLET BY MOUTH EVERY DAY. 45 tablet 3  . tiZANidine (ZANAFLEX) 4 MG tablet Take 1 tablet (4 mg total) by mouth every 6 (six) hours as needed for muscle spasms. 30 tablet 0   No current facility-administered medications on file prior to visit.     Allergies  Allergen Reactions  . Augmentin [Amoxicillin-Pot Clavulanate] Diarrhea    Family History  Problem Relation Age of Onset  . Hypertension Mother   .  Hypertension Father   . Heart disease Father        CHF   . Hypertension Sister     Social History   Socioeconomic History  . Marital status: Married    Spouse name: Not on file  . Number of children: 4  . Years of education: Not on file  . Highest education level: Not on file  Occupational History  . Occupation: Pharmacist, hospital  Social Needs  . Financial resource strain: Not on file  . Food insecurity:    Worry: Not on file    Inability: Not on file  . Transportation needs:    Medical: Not on file    Non-medical: Not on file  Tobacco Use  . Smoking status: Never Smoker  . Smokeless tobacco: Never Used  Substance and Sexual Activity  . Alcohol use: No    Alcohol/week: 0.0 oz  . Drug use: No  . Sexual activity: Yes    Partners: Female  Lifestyle  . Physical activity:    Days per week: 4 days    Minutes per session: 40 min  . Stress: Not on file  Relationships  . Social connections:    Talks on phone: Not on file    Gets together: Not on file    Attends religious service: Not on file    Active member of club or organization: Not on file    Attends meetings of  clubs or organizations: Not on file    Relationship status: Not on file  Other Topics Concern  . 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   Review of Systems - See HPI.  All other ROS are negative.  BP 118/76   Pulse 74   Temp (!) 97.4 F (36.3 C)   Ht '6\' 2"'  (1.88 m)   Wt 224 lb (101.6 kg)   SpO2 98%   BMI 28.76 kg/m   Physical Exam  Constitutional: He is oriented to person, place, and time. He appears well-developed and well-nourished.  HENT:  Head: Normocephalic and atraumatic.  Right Ear: External ear normal. A middle ear effusion (serous) is present.  Left Ear: Tympanic membrane and external ear normal.  Nose: Nose normal. No mucosal edema or rhinorrhea.  Mouth/Throat: Uvula is  midline and oropharynx is clear and moist. No oropharyngeal exudate.  Eyes: Conjunctivae are normal.  Neck: Neck supple.  Cardiovascular: Normal rate, regular rhythm, normal heart sounds and intact distal pulses.  Pulmonary/Chest: Effort normal and breath sounds normal. No stridor. No respiratory distress. He has no wheezes. He has no rales. He exhibits no tenderness.  Neurological: He is alert and oriented to person, place, and time. No cranial nerve deficit or sensory deficit. He exhibits normal muscle tone. Coordination normal.  Psychiatric: He has a normal mood and affect.  Vitals reviewed.   Recent Results (from the past 2160 hour(s))  Lipid Profile     Status: Abnormal   Collection Time: 05/29/18 10:49 AM  Result Value Ref Range   Cholesterol 148 0 - 200 mg/dL    Comment: ATP III Classification       Desirable:  < 200 mg/dL               Borderline High:  200 - 239 mg/dL          High:  > = 240 mg/dL   Triglycerides 206.0 (H) 0.0 - 149.0 mg/dL    Comment: Normal:  <150 mg/dLBorderline High:  150 - 199 mg/dL   HDL 48.60 >39.00 mg/dL   VLDL 41.2 (H) 0.0 - 40.0 mg/dL   Total CHOL/HDL Ratio 3     Comment:                Men          Women1/2 Average Risk     3.4          3.3Average Risk          5.0          4.42X Average Risk          9.6          7.13X Average Risk          15.0          11.0                       NonHDL 99.01     Comment: NOTE:  Non-HDL goal should be 30 mg/dL higher than patient's LDL goal (i.e. LDL goal of < 70 mg/dL, would have non-HDL goal of < 100 mg/dL)  Comp Met (CMET)     Status: Abnormal   Collection Time: 05/29/18 10:49 AM  Result Value Ref Range   Sodium 140 135 - 145 mEq/L   Potassium  4.2 3.5 - 5.1 mEq/L   Chloride 103 96 - 112 mEq/L   CO2 31 19 - 32 mEq/L   Glucose, Bld 91 70 - 99 mg/dL   BUN 16 6 - 23 mg/dL   Creatinine, Ser 1.14 0.40 - 1.50 mg/dL   Total Bilirubin 2.3 (H) 0.2 - 1.2 mg/dL   Alkaline Phosphatase 56 39 - 117 U/L   AST 16 0 - 37  U/L   ALT 16 0 - 53 U/L   Total Protein 6.5 6.0 - 8.3 g/dL   Albumin 4.5 3.5 - 5.2 g/dL   Calcium 9.6 8.4 - 10.5 mg/dL   GFR 72.27 >60.00 mL/min  LDL cholesterol, direct     Status: None   Collection Time: 05/29/18 10:49 AM  Result Value Ref Range   Direct LDL 69.0 mg/dL    Comment: Optimal:  <100 mg/dLNear or Above Optimal:  100-129 mg/dLBorderline High:  130-159 mg/dLHigh:  160-189 mg/dLVery High:  >190 mg/dL  Hepatic function panel     Status: Abnormal   Collection Time: 05/31/18  8:19 AM  Result Value Ref Range   Total Bilirubin 2.1 (H) 0.2 - 1.2 mg/dL   Bilirubin, Direct 0.3 0.0 - 0.3 mg/dL   Alkaline Phosphatase 56 39 - 117 U/L   AST 16 0 - 37 U/L   ALT 16 0 - 53 U/L   Total Protein 6.4 6.0 - 8.3 g/dL   Albumin 4.5 3.5 - 5.2 g/dL  Acute Hep Panel & Hep B Surface Ab     Status: None   Collection Time: 06/05/18  8:14 AM  Result Value Ref Range   Hep A IgM NON-REACTIVE NON-REACTI   Hepatitis B Surface Ag NON-REACTIVE NON-REACTI   Hep B C IgM NON-REACTIVE NON-REACTI   HEPATITIS C ANTIBODY REFILL$(REFL) NON-REACTIVE NON-REACTI   SIGNAL TO CUT-OFF 0.01 <1.00    Comment: . HCV antibody was non-reactive. There is no laboratory  evidence of HCV infection. . In most cases, no further action is required. However, if recent HCV exposure is suspected, a test for HCV RNA (test code 2207502076) is suggested. . For additional information please refer to http://education.[facility].com/faq/FAQ22v1 (This link is being provided for informational/ educational purposes only.) .   REFLEX TIQ     Status: None   Collection Time: 06/05/18  8:14 AM  Result Value Ref Range   REFLEX TIQ      Comment: OUR RECORDS INDICATE THAT YOU HAVE ORDERED ACUTE HEP PNL (REFL) ORDER CODE 22025.  THIS IS A REFLEX-SPECIFIC ORDER CODE. HOWEVER, ONLY THE INITIAL TEST WAS PERFORMED, BECAUSE WE DO NOT HAVE A REFLEX TESTING AUTHORIZATION FORM ON FILE FOR YOU.  TO  PERFORM A REFLEX TEST WE NEED YOU TO SIGN  AN AUTHORIZATION FORM  SPECIFYING (A) THE REFLEXIVE TEST AND (B) THE RESULTS THAT WILL  TRIGGER THE PERFORMANCE OF THE REFLEX TEST.  PLEASE CONTACT THE  CLIENT SERVICE REPRESENTATIVE AT [facility] IF YOU WOULD  LIKE ADDITIONAL TESTING DONE OR CONTACT YOUR SALES REPRESENTATIVE  TO OBTAIN A COPY OF THE REFLEXIVE TESTING AUTHORIZATION FORM. . .     Assessment/Plan: 1. Vertigo Neurological examination is completely within normal limits which is very reassuring. OVS negative. Recent + [employer] testing.  L TM with seroud fluid noted. Patient is not taking his Tizanidine or doing Epley maneuvers. Will have him take Flonase for fluid behind ear drum. Start Epley maneuvers. Tizanidine at night. Meclizine as directed when needed. Close follow-up scheduled. Strict ER precautions reviewed with patient  and his wife.    Leeanne Rio, PA-C

## 2018-06-06 NOTE — Telephone Encounter (Signed)
He called in c/o having woke up at 2am this morning with vertigo and terrible nausea.   He also had numbness on the left side of his face.   He did not get up from the bed.   When he woke up this morning the symptoms were gone except and "Mild earache" with left ear.  I scheduled him with Raiford Noble, PA-C for today at 4:00 after checking with Raiford Noble that it was ok to see him in the office versus going to the ED.    Reason for Disposition . [1] NO dizziness now AND [2] one or more stroke risk factors (i.e., hypertension, diabetes, prior stroke/TIA/heart attack)  Answer Assessment - Initial Assessment Questions 1. DESCRIPTION: "Describe your dizziness."     2:00am I woke up with bad vertigo and nausea and a numbness in the left side of my face.    I've had another spell like this about 2 weeks ago.   2. VERTIGO: "Do you feel like either you or the room is spinning or tilting?"      This morning at 2am yes.     3. LIGHTHEADED: "Do you feel lightheaded?" (e.g., somewhat faint, woozy, weak upon standing)     No 4. SEVERITY: "How bad is it?"  "Can you walk?"   - MILD - Feels unsteady but walking normally.   - MODERATE - Feels very unsteady when walking, but not falling; interferes with normal activities (e.g., school, work) .   - SEVERE - Unable to walk without falling (requires assistance).     I'm fine now. 5. ONSET:  "When did the dizziness begin?"     2am this morning. 6. AGGRAVATING FACTORS: "Does anything make it worse?" (e.g., standing, change in head position)     The vertigo is worse if I squat down and then stand back up. 7. CAUSE: "What do you think is causing the dizziness?"     I don't know. 8. RECURRENT SYMPTOM: "Have you had dizziness before?" If so, ask: "When was the last time?" "What happened that time?"     Had a spell like this one other time. 9. OTHER SYMPTOMS: "Do you have any other symptoms?" (e.g., headache, weakness, numbness, vomiting, earache)  Numbness on left side of my face is gone.   No headache or one sided weakness.   I did not get up when it happened from the bed.   It was not there when I woke up this morning except left ear felt like a "mild earache".   10. PREGNANCY: "Is there any chance you are pregnant?" "When was your last menstrual period?"       N/A  Protocols used: DIZZINESS - VERTIGO-A-AH

## 2018-06-06 NOTE — Telephone Encounter (Signed)
FYI

## 2018-06-12 ENCOUNTER — Other Ambulatory Visit: Payer: Self-pay

## 2018-06-12 ENCOUNTER — Ambulatory Visit (INDEPENDENT_AMBULATORY_CARE_PROVIDER_SITE_OTHER): Payer: BC Managed Care – PPO | Admitting: Physician Assistant

## 2018-06-12 ENCOUNTER — Encounter: Payer: Self-pay | Admitting: Physician Assistant

## 2018-06-12 VITALS — BP 112/78 | HR 74 | Temp 97.8°F | Resp 16 | Ht 74.0 in | Wt 219.6 lb

## 2018-06-12 DIAGNOSIS — Z125 Encounter for screening for malignant neoplasm of prostate: Secondary | ICD-10-CM | POA: Diagnosis not present

## 2018-06-12 DIAGNOSIS — Z0000 Encounter for general adult medical examination without abnormal findings: Secondary | ICD-10-CM

## 2018-06-12 LAB — CBC WITH DIFFERENTIAL/PLATELET
BASOS PCT: 0.8 % (ref 0.0–3.0)
Basophils Absolute: 0 10*3/uL (ref 0.0–0.1)
EOS PCT: 3.6 % (ref 0.0–5.0)
Eosinophils Absolute: 0.2 10*3/uL (ref 0.0–0.7)
HEMATOCRIT: 42.4 % (ref 39.0–52.0)
HEMOGLOBIN: 14.7 g/dL (ref 13.0–17.0)
Lymphocytes Relative: 36.9 % (ref 12.0–46.0)
Lymphs Abs: 2.1 10*3/uL (ref 0.7–4.0)
MCHC: 34.6 g/dL (ref 30.0–36.0)
MCV: 85.5 fl (ref 78.0–100.0)
MONO ABS: 0.6 10*3/uL (ref 0.1–1.0)
MONOS PCT: 10.7 % (ref 3.0–12.0)
Neutro Abs: 2.7 10*3/uL (ref 1.4–7.7)
Neutrophils Relative %: 48 % (ref 43.0–77.0)
Platelets: 183 10*3/uL (ref 150.0–400.0)
RBC: 4.96 Mil/uL (ref 4.22–5.81)
RDW: 13.3 % (ref 11.5–15.5)
WBC: 5.6 10*3/uL (ref 4.0–10.5)

## 2018-06-12 LAB — PSA: PSA: 0.82 ng/mL (ref 0.10–4.00)

## 2018-06-12 NOTE — Patient Instructions (Signed)
Please go to the lab for blood work.   Our office will call you with your results unless you have chosen to receive results via MyChart.  If your blood work is normal we will follow-up each year for physicals and as scheduled for chronic medical problems.  If anything is abnormal we will treat accordingly and get you in for a follow-up.   Preventive Care 40-64 Years, Male Preventive care refers to lifestyle choices and visits with your health care provider that can promote health and wellness. What does preventive care include?  A yearly physical exam. This is also called an annual well check.  Dental exams once or twice a year.  Routine eye exams. Ask your health care provider how often you should have your eyes checked.  Personal lifestyle choices, including: ? Daily care of your teeth and gums. ? Regular physical activity. ? Eating a healthy diet. ? Avoiding tobacco and drug use. ? Limiting alcohol use. ? Practicing safe sex. ? Taking low-dose aspirin every day starting at age 50. What happens during an annual well check? The services and screenings done by your health care provider during your annual well check will depend on your age, overall health, lifestyle risk factors, and family history of disease. Counseling Your health care provider may ask you questions about your:  Alcohol use.  Tobacco use.  Drug use.  Emotional well-being.  Home and relationship well-being.  Sexual activity.  Eating habits.  Work and work environment.  Screening You may have the following tests or measurements:  Height, weight, and BMI.  Blood pressure.  Lipid and cholesterol levels. These may be checked every 5 years, or more frequently if you are over 50 years old.  Skin check.  Lung cancer screening. You may have this screening every year starting at age 55 if you have a 30-pack-year history of smoking and currently smoke or have quit within the past 15 years.  Fecal  occult blood test (FOBT) of the stool. You may have this test every year starting at age 50.  Flexible sigmoidoscopy or colonoscopy. You may have a sigmoidoscopy every 5 years or a colonoscopy every 10 years starting at age 50.  Prostate cancer screening. Recommendations will vary depending on your family history and other risks.  Hepatitis C blood test.  Hepatitis B blood test.  Sexually transmitted disease (STD) testing.  Diabetes screening. This is done by checking your blood sugar (glucose) after you have not eaten for a while (fasting). You may have this done every 1-3 years.  Discuss your test results, treatment options, and if necessary, the need for more tests with your health care provider. Vaccines Your health care provider may recommend certain vaccines, such as:  Influenza vaccine. This is recommended every year.  Tetanus, diphtheria, and acellular pertussis (Tdap, Td) vaccine. You may need a Td booster every 10 years.  Varicella vaccine. You may need this if you have not been vaccinated.  Zoster vaccine. You may need this after age 60.  Measles, mumps, and rubella (MMR) vaccine. You may need at least one dose of MMR if you were born in 1957 or later. You may also need a second dose.  Pneumococcal 13-valent conjugate (PCV13) vaccine. You may need this if you have certain conditions and have not been vaccinated.  Pneumococcal polysaccharide (PPSV23) vaccine. You may need one or two doses if you smoke cigarettes or if you have certain conditions.  Meningococcal vaccine. You may need this if you have certain   conditions.  Hepatitis A vaccine. You may need this if you have certain conditions or if you travel or work in places where you may be exposed to hepatitis A.  Hepatitis B vaccine. You may need this if you have certain conditions or if you travel or work in places where you may be exposed to hepatitis B.  Haemophilus influenzae type b (Hib) vaccine. You may need  this if you have certain risk factors.  Talk to your health care provider about which screenings and vaccines you need and how often you need them. This information is not intended to replace advice given to you by your health care provider. Make sure you discuss any questions you have with your health care provider. Document Released: 11/13/2015 Document Revised: 07/06/2016 Document Reviewed: 08/18/2015 Elsevier Interactive Patient Education  2018 [employer] .      

## 2018-06-12 NOTE — Assessment & Plan Note (Signed)
The natural history of prostate cancer and ongoing controversy regarding screening and potential treatment outcomes of prostate cancer has been discussed with the patient. The meaning of a false positive PSA and a false negative PSA has been discussed. He indicates understanding of the limitations of this screening test and wishes  to proceed with screening PSA testing.  

## 2018-06-12 NOTE — Assessment & Plan Note (Signed)
Depression screen negative. Health Maintenance reviewed -- Colonoscopy to be scheduled. Preventive schedule discussed and handout given in AVS. Will obtain fasting labs today.

## 2018-06-12 NOTE — Progress Notes (Signed)
Patient presents to clinic today for annual exam.  Patient is fasting for labs.  Diet -- Is keeping a well-balanced diet.  Exercise -- Cardio and resistance 4 x week currently.  Health Maintenance: Immunizations -- up-to-date.  Colonoscopy -- Is being scheduled for January of 2020.   Past Medical History:  Diagnosis Date  . Allergy   . Anxiety and depression   . Hyperlipidemia   . Prostate infection     Past Surgical History:  Procedure Laterality Date  . SPINE SURGERY Right    disc shaved on right side  . TONSILLECTOMY      Current Outpatient Medications on File Prior to Visit  Medication Sig Dispense Refill  . atorvastatin (LIPITOR) 20 MG tablet Take 1 tablet (20 mg total) by mouth daily at 6 PM. 90 tablet 1  . benzonatate (TESSALON) 100 MG capsule Take 1 capsule (100 mg total) by mouth 2 (two) times daily as needed for cough. 20 capsule 0  . citalopram (CELEXA) 40 MG tablet TAKE 1/2 TABLET BY MOUTH DAILY. No More Refills until Appointment. Schedule a Physical 45 tablet 0  . gabapentin (NEURONTIN) 300 MG capsule Take 1 capsule (300 mg total) by mouth at bedtime. 30 capsule 5  . meclizine (ANTIVERT) 25 MG tablet Take 1 tablet (25 mg total) by mouth 3 (three) times daily as needed for dizziness. 30 tablet 0  . metoprolol succinate (TOPROL-XL) 25 MG 24 hr tablet TAKE 1/2 TABLET BY MOUTH EVERY DAY. 45 tablet 3  . tiZANidine (ZANAFLEX) 4 MG tablet Take 1 tablet (4 mg total) by mouth every 6 (six) hours as needed for muscle spasms. 30 tablet 0  . valACYclovir (VALTREX) 500 MG tablet TAKE 4 TABLETS BY MOUTH AT ONSET AND 4 12 HOURS LATER    . Ascorbic Acid (VITAMIN C) 100 MG tablet Take 500 mg by mouth 2 (two) times daily.     No current facility-administered medications on file prior to visit.     Allergies  Allergen Reactions  . Augmentin [Amoxicillin-Pot Clavulanate] Diarrhea    Family History  Problem Relation Age of Onset  . Hypertension Mother   . Hypertension  Father   . Heart disease Father        CHF   . Hypertension Sister     Social History   Socioeconomic History  . Marital status: Married    Spouse name: Not on file  . Number of children: 4  . Years of education: Not on file  . Highest education level: Not on file  Occupational History  . Occupation: Pharmacist, hospital  Social Needs  . Financial resource strain: Not on file  . Food insecurity:    Worry: Not on file    Inability: Not on file  . Transportation needs:    Medical: Not on file    Non-medical: Not on file  Tobacco Use  . Smoking status: Never Smoker  . Smokeless tobacco: Never Used  Substance and Sexual Activity  . Alcohol use: No    Alcohol/week: 0.0 standard drinks  . Drug use: No  . Sexual activity: Yes    Partners: Female  Lifestyle  . Physical activity:    Days per week: 4 days    Minutes per session: 40 min  . Stress: Not on file  Relationships  . Social connections:    Talks on phone: Not on file    Gets together: Not on file    Attends religious service: Not on file  Active member of club or organization: Not on file    Attends meetings of clubs or organizations: Not on file    Relationship status: Not on file  . Intimate partner violence:    Fear of current or ex partner: Not on file    Emotionally abused: Not on file    Physically abused: Not on file    Forced sexual activity: Not on file  Other Topics Concern  . 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    Review of Systems  Constitutional: Negative for fever and weight loss.  HENT: Negative for ear discharge, ear pain, hearing loss and tinnitus.   Eyes: Negative for blurred vision, double vision, photophobia and pain.  Respiratory: Negative for cough and shortness of breath.   Cardiovascular: Negative for chest pain and palpitations.  Gastrointestinal: Negative for  abdominal pain, blood in stool, constipation, diarrhea, heartburn, melena, nausea and vomiting.  Genitourinary: Negative for dysuria, flank pain, frequency, hematuria and urgency.       Nocturia x 0-1  Musculoskeletal: Negative for falls.  Neurological: Negative for dizziness, loss of consciousness and headaches.  Endo/Heme/Allergies: Negative for environmental allergies.  Psychiatric/Behavioral: Negative for depression, hallucinations, substance abuse and suicidal ideas. The patient is not nervous/anxious and does not have insomnia.    BP 112/78   Pulse 74   Temp 97.8 F (36.6 C) (Oral)   Resp 16   Ht _0  (1.88 m)   Wt 219 lb 9.6 oz (99.6 kg)   SpO2 96%   BMI 28.19 kg/m   Physical Exam  Constitutional: He is oriented to person, place, and time. He appears well-developed and well-nourished. No distress.  HENT:  Head: Normocephalic and atraumatic.  Right Ear: Tympanic membrane, external ear and ear canal normal.  Left Ear: Tympanic membrane, external ear and ear canal normal.  Nose: Nose normal.  Mouth/Throat: Oropharynx is clear and moist and mucous membranes are normal. No posterior oropharyngeal edema or posterior oropharyngeal erythema.  Eyes: Pupils are equal, round, and reactive to light. Conjunctivae are normal.  Neck: Neck supple. No thyromegaly present.  Cardiovascular: Normal rate, regular rhythm, normal heart sounds and intact distal pulses.  Pulmonary/Chest: Effort normal and breath sounds normal. No respiratory distress. He has no wheezes. He has no rales. He exhibits no tenderness.  Abdominal: Soft. Bowel sounds are normal. He exhibits no distension and no mass. There is no tenderness. There is no rebound and no guarding.  Lymphadenopathy:    He has no cervical adenopathy.  Neurological: He is alert and oriented to person, place, and time. No cranial nerve deficit.  Skin: Skin is warm and dry. No rash noted. He is not diaphoretic.  Psychiatric: He has a normal mood  and affect.  Vitals reviewed.   Recent Results (from the past 2160 hour(s))  Lipid Profile     Status: Abnormal   Collection Time: 05/29/18 10:49 AM  Result Value Ref Range   Cholesterol 148 0 - 200 mg/dL    Comment: ATP III Classification       Desirable:  < 200 mg/dL               Borderline High:  200 - 239 mg/dL          High:  > = 240 mg/dL   Triglycerides 206.0 (H) 0.0 - 149.0 mg/dL  Comment: Normal:  <150 mg/dLBorderline High:  150 - 199 mg/dL   HDL 48.60 >39.00 mg/dL   VLDL 41.2 (H) 0.0 - 40.0 mg/dL   Total CHOL/HDL Ratio 3     Comment:                Men          Women1/2 Average Risk     3.4          3.3Average Risk          5.0          4.42X Average Risk          9.6          7.13X Average Risk          15.0          11.0                       NonHDL 99.01     Comment: NOTE:  Non-HDL goal should be 30 mg/dL higher than patient's LDL goal (i.e. LDL goal of < 70 mg/dL, would have non-HDL goal of < 100 mg/dL)  Comp Met (CMET)     Status: Abnormal   Collection Time: 05/29/18 10:49 AM  Result Value Ref Range   Sodium 140 135 - 145 mEq/L   Potassium 4.2 3.5 - 5.1 mEq/L   Chloride 103 96 - 112 mEq/L   CO2 31 19 - 32 mEq/L   Glucose, Bld 91 70 - 99 mg/dL   BUN 16 6 - 23 mg/dL   Creatinine, Ser 1.14 0.40 - 1.50 mg/dL   Total Bilirubin 2.3 (H) 0.2 - 1.2 mg/dL   Alkaline Phosphatase 56 39 - 117 U/L   AST 16 0 - 37 U/L   ALT 16 0 - 53 U/L   Total Protein 6.5 6.0 - 8.3 g/dL   Albumin 4.5 3.5 - 5.2 g/dL   Calcium 9.6 8.4 - 10.5 mg/dL   GFR 72.27 >60.00 mL/min  LDL cholesterol, direct     Status: None   Collection Time: 05/29/18 10:49 AM  Result Value Ref Range   Direct LDL 69.0 mg/dL    Comment: Optimal:  <100 mg/dLNear or Above Optimal:  100-129 mg/dLBorderline High:  130-159 mg/dLHigh:  160-189 mg/dLVery High:  >190 mg/dL  Hepatic function panel     Status: Abnormal   Collection Time: 05/31/18  8:19 AM  Result Value Ref Range   Total Bilirubin 2.1 (H) 0.2 - 1.2 mg/dL    Bilirubin, Direct 0.3 0.0 - 0.3 mg/dL   Alkaline Phosphatase 56 39 - 117 U/L   AST 16 0 - 37 U/L   ALT 16 0 - 53 U/L   Total Protein 6.4 6.0 - 8.3 g/dL   Albumin 4.5 3.5 - 5.2 g/dL  Acute Hep Panel & Hep B Surface Ab     Status: None   Collection Time: 06/05/18  8:14 AM  Result Value Ref Range   Hep A IgM NON-REACTIVE NON-REACTI   Hepatitis B Surface Ag NON-REACTIVE NON-REACTI   Hep B C IgM NON-REACTIVE NON-REACTI   HEPATITIS C ANTIBODY REFILL$(REFL) NON-REACTIVE NON-REACTI   SIGNAL TO CUT-OFF 0.01 <1.00    Comment: . HCV antibody was non-reactive. There is no laboratory  evidence of HCV infection. . In most cases, no further action is required. However, if recent HCV exposure is suspected, a test for HCV RNA (test code [phone]) is suggested. . For additional information  please refer to http://education.[facility].com/faq/FAQ22v1 (This link is being provided for informational/ educational purposes only.) .   REFLEX TIQ     Status: None   Collection Time: 06/05/18  8:14 AM  Result Value Ref Range   REFLEX TIQ      Comment: OUR RECORDS INDICATE THAT YOU HAVE ORDERED ACUTE HEP PNL (REFL) ORDER CODE 95974.  THIS IS A REFLEX-SPECIFIC ORDER CODE. HOWEVER, ONLY THE INITIAL TEST WAS PERFORMED, BECAUSE WE DO NOT HAVE A REFLEX TESTING AUTHORIZATION FORM ON FILE FOR YOU.  TO  PERFORM A REFLEX TEST WE NEED YOU TO SIGN AN AUTHORIZATION FORM  SPECIFYING (A) THE REFLEXIVE TEST AND (B) THE RESULTS THAT WILL  TRIGGER THE PERFORMANCE OF THE REFLEX TEST.  PLEASE CONTACT THE  CLIENT SERVICE REPRESENTATIVE AT [facility] IF YOU WOULD  LIKE ADDITIONAL TESTING DONE OR CONTACT YOUR SALES REPRESENTATIVE  TO OBTAIN A COPY OF THE REFLEXIVE TESTING AUTHORIZATION FORM. . .     Assessment/Plan: Visit for preventive health examination Depression screen negative. Health Maintenance reviewed -- Colonoscopy to be scheduled. Preventive schedule discussed and handout given in AVS. Will  obtain fasting labs today.   Prostate cancer screening The natural history of prostate cancer and ongoing controversy regarding screening and potential treatment outcomes of prostate cancer has been discussed with the patient. The meaning of a false positive PSA and a false negative PSA has been discussed. He indicates understanding of the limitations of this screening test and wishes to proceed with screening PSA testing.     Leeanne Rio, PA-C

## 2018-06-27 ENCOUNTER — Telehealth: Payer: Self-pay | Admitting: Physician Assistant

## 2018-06-27 DIAGNOSIS — M25562 Pain in left knee: Principal | ICD-10-CM

## 2018-06-27 DIAGNOSIS — G8929 Other chronic pain: Secondary | ICD-10-CM

## 2018-06-27 DIAGNOSIS — M25561 Pain in right knee: Principal | ICD-10-CM

## 2018-06-27 MED ORDER — GABAPENTIN 300 MG PO CAPS: 300.0000 mg | ORAL_CAPSULE | Freq: Every day | ORAL | 5 refills | Status: DC

## 2018-06-27 NOTE — Telephone Encounter (Signed)
Gabapentin refill Last Refill:05/11/17 # 30 cap 5 RF Last OV: 01/10/17 PCP: Francisco Noble PA Pharmacy:[facility] [address]  Last OV exceeds 12 months

## 2018-06-27 NOTE — Telephone Encounter (Signed)
Patient has had several office visits since 01/10/17, the last being 06/12/18 for a CPE. Medication refill sent.

## 2018-06-27 NOTE — Telephone Encounter (Signed)
Copied from [place]. Topic: Quick Communication - Rx Refill/Question >> Jun 27, 2018  7:31 AM Keene Breath wrote: Medication: gabapentin (NEURONTIN) 300 MG capsule  Patient called to request a refill for the above medication.  CB# [phone].  Preferred Pharmacy (with phone number or street name): [id number] [place], [place] [phone] (Phone) [phone] (Fax)

## 2018-07-19 ENCOUNTER — Other Ambulatory Visit: Payer: Self-pay | Admitting: Physician Assistant

## 2018-07-19 DIAGNOSIS — M25561 Pain in right knee: Principal | ICD-10-CM

## 2018-07-19 DIAGNOSIS — G8929 Other chronic pain: Secondary | ICD-10-CM

## 2018-07-19 DIAGNOSIS — M25562 Pain in left knee: Principal | ICD-10-CM

## 2018-07-19 NOTE — Telephone Encounter (Signed)
Routed to appropriate practice.

## 2018-07-25 ENCOUNTER — Encounter: Payer: Self-pay | Admitting: Physician Assistant

## 2018-07-25 ENCOUNTER — Other Ambulatory Visit: Payer: Self-pay

## 2018-07-25 ENCOUNTER — Ambulatory Visit: Payer: BC Managed Care – PPO | Admitting: Physician Assistant

## 2018-07-25 VITALS — BP 110/70 | HR 77 | Temp 97.8°F | Resp 16 | Ht 74.0 in | Wt 220.0 lb

## 2018-07-25 DIAGNOSIS — Z23 Encounter for immunization: Secondary | ICD-10-CM

## 2018-07-25 DIAGNOSIS — M5441 Lumbago with sciatica, right side: Secondary | ICD-10-CM

## 2018-07-25 MED ORDER — METHYLPREDNISOLONE 4 MG PO TBPK: ORAL_TABLET | ORAL | 0 refills | Status: DC

## 2018-07-25 MED ORDER — METHYLPREDNISOLONE ACETATE 80 MG/ML IJ SUSP
80.0000 mg | Freq: Once | INTRAMUSCULAR | Status: AC
  Administered 2018-07-25: 80 mg via INTRAMUSCULAR

## 2018-07-25 NOTE — Patient Instructions (Signed)
Please avoid heavy lifting and overexertion. Continue the Tizanidine as directed. Tylenol for breakthrough pain. Start the medrol dose pack tomorrow if symptoms are still present.

## 2018-07-25 NOTE — Progress Notes (Signed)
Patient presents to clinic today c/o pain in the lower back starting yesterday when bending over to pick up socks. Notes immediate pain and a pulling sensation in lower back. Is bilateral with R > L. Notes radiation of pain into the RLE with some occasional numbness/tingling. Denies saddle anesthesia. Denies N/V or change to bowels. Pain is 7/10 currently. Took a muscle relaxant last night which helped some.   Past Medical History:  Diagnosis Date  . Allergy   . Anxiety and depression   . Hyperlipidemia   . Prostate infection     Current Outpatient Medications on File Prior to Visit  Medication Sig Dispense Refill  . Ascorbic Acid (VITAMIN C) 100 MG tablet Take 500 mg by mouth 2 (two) times daily.    Marland Kitchen atorvastatin (LIPITOR) 20 MG tablet Take 1 tablet (20 mg total) by mouth daily at 6 PM. 90 tablet 1  . citalopram (CELEXA) 40 MG tablet TAKE 1/2 TABLET BY MOUTH DAILY. No More Refills until Appointment. Schedule a Physical 45 tablet 0  . gabapentin (NEURONTIN) 300 MG capsule TAKE 1 CAPSULE BY MOUTH EVERYDAY AT BEDTIME 90 capsule 1  . metoprolol succinate (TOPROL-XL) 25 MG 24 hr tablet TAKE 1/2 TABLET BY MOUTH EVERY DAY. 45 tablet 3  . tiZANidine (ZANAFLEX) 4 MG tablet Take 1 tablet (4 mg total) by mouth every 6 (six) hours as needed for muscle spasms. 30 tablet 0  . valACYclovir (VALTREX) 500 MG tablet TAKE 4 TABLETS BY MOUTH AT ONSET AND 4 12 HOURS LATER     No current facility-administered medications on file prior to visit.     Allergies  Allergen Reactions  . Augmentin [Amoxicillin-Pot Clavulanate] Diarrhea    Family History  Problem Relation Age of Onset  . Hypertension Mother   . Hypertension Father   . Heart disease Father        CHF   . Hypertension Sister     Social History   Socioeconomic History  . Marital status: Married    Spouse name: Not on file  . Number of children: 4  . Years of education: Not on file  . Highest education level: Not on file    Occupational History  . Occupation: Pharmacist, hospital  Social Needs  . Financial resource strain: Not on file  . Food insecurity:    Worry: Not on file    Inability: Not on file  . Transportation needs:    Medical: Not on file    Non-medical: Not on file  Tobacco Use  . Smoking status: Never Smoker  . Smokeless tobacco: Never Used  Substance and Sexual Activity  . Alcohol use: No    Alcohol/week: 0.0 standard drinks  . Drug use: No  . Sexual activity: Yes    Partners: Female  Lifestyle  . Physical activity:    Days per week: 4 days    Minutes per session: 40 min  . Stress: Not on file  Relationships  . Social connections:    Talks on phone: Not on file    Gets together: Not on file    Attends religious service: Not on file    Active member of club or organization: Not on file    Attends meetings of clubs or organizations: Not on file    Relationship status: Not on file  Other Topics Concern  . 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   Review of Systems - See HPI.  All other ROS are negative.  BP 110/70   Pulse 77   Temp 97.8 F (36.6 C) (Oral)   Resp 16   Ht _0  (1.88 m)   Wt 220 lb (99.8 kg)   SpO2 98%   BMI 28.25 kg/m   Physical Exam  Constitutional: He is oriented to person, place, and time. He appears well-developed and well-nourished.  HENT:  Head: Normocephalic and atraumatic.  Eyes: Conjunctivae are normal.  Cardiovascular: Normal rate, regular rhythm, normal heart sounds and intact distal pulses.  Pulmonary/Chest: Effort normal and breath sounds normal. No stridor. No respiratory distress. He has no wheezes. He has no rales. He exhibits no tenderness.  Musculoskeletal:       Lumbar back: He exhibits tenderness, pain and spasm. He exhibits normal range of motion and no bony tenderness.  Neurological: He is alert and oriented to person,  place, and time.  Psychiatric: He has a normal mood and affect.  Vitals reviewed.  Recent Results (from the past 2160 hour(s))  Lipid Profile     Status: Abnormal   Collection Time: 05/29/18 10:49 AM  Result Value Ref Range   Cholesterol 148 0 - 200 mg/dL    Comment: ATP III Classification       Desirable:  < 200 mg/dL               Borderline High:  200 - 239 mg/dL          High:  > = 240 mg/dL   Triglycerides 206.0 (H) 0.0 - 149.0 mg/dL    Comment: Normal:  <150 mg/dLBorderline High:  150 - 199 mg/dL   HDL 48.60 >39.00 mg/dL   VLDL 41.2 (H) 0.0 - 40.0 mg/dL   Total CHOL/HDL Ratio 3     Comment:                Men          Women1/2 Average Risk     3.4          3.3Average Risk          5.0          4.42X Average Risk          9.6          7.13X Average Risk          15.0          11.0                       NonHDL 99.01     Comment: NOTE:  Non-HDL goal should be 30 mg/dL higher than patient's LDL goal (i.e. LDL goal of < 70 mg/dL, would have non-HDL goal of < 100 mg/dL)  Comp Met (CMET)     Status: Abnormal   Collection Time: 05/29/18 10:49 AM  Result Value Ref Range   Sodium 140 135 - 145 mEq/L   Potassium 4.2 3.5 - 5.1 mEq/L   Chloride 103 96 - 112 mEq/L   CO2 31 19 - 32 mEq/L   Glucose, Bld 91 70 - 99 mg/dL   BUN 16 6 - 23 mg/dL   Creatinine, Ser 1.14 0.40 - 1.50 mg/dL   Total Bilirubin 2.3 (H) 0.2 - 1.2 mg/dL   Alkaline Phosphatase 56 39 - 117 U/L   AST 16 0 - 37 U/L   ALT 16 0 -  53 U/L   Total Protein 6.5 6.0 - 8.3 g/dL   Albumin 4.5 3.5 - 5.2 g/dL   Calcium 9.6 8.4 - 10.5 mg/dL   GFR 72.27 >60.00 mL/min  LDL cholesterol, direct     Status: None   Collection Time: 05/29/18 10:49 AM  Result Value Ref Range   Direct LDL 69.0 mg/dL    Comment: Optimal:  <100 mg/dLNear or Above Optimal:  100-129 mg/dLBorderline High:  130-159 mg/dLHigh:  160-189 mg/dLVery High:  >190 mg/dL  Hepatic function panel     Status: Abnormal   Collection Time: 05/31/18  8:19 AM  Result Value Ref  Range   Total Bilirubin 2.1 (H) 0.2 - 1.2 mg/dL   Bilirubin, Direct 0.3 0.0 - 0.3 mg/dL   Alkaline Phosphatase 56 39 - 117 U/L   AST 16 0 - 37 U/L   ALT 16 0 - 53 U/L   Total Protein 6.4 6.0 - 8.3 g/dL   Albumin 4.5 3.5 - 5.2 g/dL  Acute Hep Panel & Hep B Surface Ab     Status: None   Collection Time: 06/05/18  8:14 AM  Result Value Ref Range   Hep A IgM NON-REACTIVE NON-REACTI   Hepatitis B Surface Ag NON-REACTIVE NON-REACTI   Hep B C IgM NON-REACTIVE NON-REACTI   HEPATITIS C ANTIBODY REFILL$(REFL) NON-REACTIVE NON-REACTI   SIGNAL TO CUT-OFF 0.01 <1.00    Comment: . HCV antibody was non-reactive. There is no laboratory  evidence of HCV infection. . In most cases, no further action is required. However, if recent HCV exposure is suspected, a test for HCV RNA (test code [phone]) is suggested. . For additional information please refer to http://education.[facility].com/faq/FAQ22v1 (This link is being provided for informational/ educational purposes only.) .   REFLEX TIQ     Status: None   Collection Time: 06/05/18  8:14 AM  Result Value Ref Range   REFLEX TIQ      Comment: OUR RECORDS INDICATE THAT YOU HAVE ORDERED ACUTE HEP PNL (REFL) ORDER CODE 62130.  THIS IS A REFLEX-SPECIFIC ORDER CODE. HOWEVER, ONLY THE INITIAL TEST WAS PERFORMED, BECAUSE WE DO NOT HAVE A REFLEX TESTING AUTHORIZATION FORM ON FILE FOR YOU.  TO  PERFORM A REFLEX TEST WE NEED YOU TO SIGN AN AUTHORIZATION FORM  SPECIFYING (A) THE REFLEXIVE TEST AND (B) THE RESULTS THAT WILL  TRIGGER THE PERFORMANCE OF THE REFLEX TEST.  PLEASE CONTACT THE  CLIENT SERVICE REPRESENTATIVE AT [facility] IF YOU WOULD  LIKE ADDITIONAL TESTING DONE OR CONTACT YOUR SALES REPRESENTATIVE  TO OBTAIN A COPY OF THE REFLEXIVE TESTING AUTHORIZATION FORM. . .   PSA     Status: None   Collection Time: 06/12/18  8:39 AM  Result Value Ref Range   PSA 0.82 0.10 - 4.00 ng/mL    Comment: Test performed using Access [employer]  PSA Assay, a parmagnetic partical, chemiluminecent immunoassay.  CBC w/Diff     Status: None   Collection Time: 06/12/18  8:39 AM  Result Value Ref Range   WBC 5.6 4.0 - 10.5 K/uL   RBC 4.96 4.22 - 5.81 Mil/uL   Hemoglobin 14.7 13.0 - 17.0 g/dL   HCT 42.4 39.0 - 52.0 %   MCV 85.5 78.0 - 100.0 fl   MCHC 34.6 30.0 - 36.0 g/dL   RDW 13.3 11.5 - 15.5 %   Platelets 183.0 150.0 - 400.0 K/uL   Neutrophils Relative % 48.0 43.0 - 77.0 %   Lymphocytes Relative 36.9 12.0 - 46.0 %  Monocytes Relative 10.7 3.0 - 12.0 %   Eosinophils Relative 3.6 0.0 - 5.0 %   Basophils Relative 0.8 0.0 - 3.0 %   Neutro Abs 2.7 1.4 - 7.7 K/uL   Lymphs Abs 2.1 0.7 - 4.0 K/uL   Monocytes Absolute 0.6 0.1 - 1.0 K/uL   Eosinophils Absolute 0.2 0.0 - 0.7 K/uL   Basophils Absolute 0.0 0.0 - 0.1 K/uL    Assessment/Plan: 1. Acute right-sided low back pain with right-sided sciatica Im depo-medrol given today. Will start medrol pack tomorrow. Continue Tizanidine. Supportive measures and OTC medications reviewed. Follow-up If not resolving.  - methylPREDNISolone (MEDROL DOSEPAK) 4 MG TBPK tablet; Take following package directions. Starting 07/26/18  Dispense: 21 tablet; Refill: 0 - methylPREDNISolone acetate (DEPO-MEDROL) injection 80 mg  2. Need for immunization against influenza Flu shot updated today. - Flu Vaccine QUAD 36+ mos IM   Leeanne Rio, PA-C

## 2018-07-29 ENCOUNTER — Other Ambulatory Visit: Payer: Self-pay | Admitting: Physician Assistant

## 2018-08-14 ENCOUNTER — Telehealth: Payer: Self-pay | Admitting: Physician Assistant

## 2018-08-14 NOTE — Telephone Encounter (Signed)
Copied from [place] [id number]. Topic: Quick Communication - See Telephone Encounter >> Aug 14, 2018  9:56 AM Gardiner Ramus wrote: CRM for notification. See Telephone encounter for: 08/14/18. Pt called and stated that he would like Dr Hassell Done to call him back. Pt states that he has been having frequent cramps in both calf's. Please advise [phone]

## 2018-08-14 NOTE — Telephone Encounter (Signed)
Left message for patient to call to discuss. Need to schedule an appointment.    CRM Created. Okay for [facility] to discuss with patient/schedule appt

## 2018-08-17 ENCOUNTER — Other Ambulatory Visit: Payer: Self-pay

## 2018-08-17 ENCOUNTER — Ambulatory Visit: Payer: BC Managed Care – PPO | Admitting: Family Medicine

## 2018-08-17 ENCOUNTER — Encounter: Payer: Self-pay | Admitting: Family Medicine

## 2018-08-17 VITALS — BP 112/80 | HR 80 | Temp 98.0°F | Resp 16 | Ht 74.0 in | Wt 218.1 lb

## 2018-08-17 DIAGNOSIS — G4762 Sleep related leg cramps: Secondary | ICD-10-CM | POA: Diagnosis not present

## 2018-08-17 DIAGNOSIS — R5383 Other fatigue: Secondary | ICD-10-CM

## 2018-08-17 NOTE — Patient Instructions (Signed)
Follow up as needed or as scheduled We'll notify you of your lab results and determine the next steps Continue to drink plenty of fluids, eat bananas, and consider adding mustard or tonic water prior to bed Wear good supportive footwear to prevent leg fatigue Consider compression socks at work since you do a lot of standing Call with any questions or concerns Hang in there!

## 2018-08-17 NOTE — Progress Notes (Signed)
   Subjective:    Patient ID: Francisco Pena, male    DOB: [birth year], 52 y.o.   MRN: [id number]  HPI Leg cramps- will have cramping in calves bilaterally, R>L.  sxs started ~1 month ago.  Pt reports drinking 'a lot of water', eats 'a lot of bananas' 1-2 daily.  No medications new or different.  No supplements.  Has decreased the amount of walking.  No recent change in footwear that correlates.  sxs are mostly in the overnight or early morning hours.  Takes gabapentin for RLS.  + fatigue- pt has a CPAP but takes it off nightly.  Reports he can fall asleep at a moment's notice.  He states he feels like he did in August when he came to see [place].  Wife and pt state that he is under 'constant stress'.     Review of Systems For ROS see HPI     Objective:   Physical Exam  Constitutional: He is oriented to person, place, and time. He appears well-developed and well-nourished. No distress.  HENT:  Head: Normocephalic and atraumatic.  Musculoskeletal: He exhibits no edema or tenderness (no TTP over either calf, no palpable cords).  Neurological: He is alert and oriented to person, place, and time.  Skin: Skin is warm and dry. No rash noted. No erythema.  Psychiatric: He has a normal mood and affect. His behavior is normal. Thought content normal.  Vitals reviewed.         Assessment & Plan:  Leg cramps- new.  Pt reports this started ~1 month ago.  Denies medication changes, increased activity- this has actually decreased.  May be due to his RLS.  No evidence of DVT, sxs are not consistent w/ claudication.  Discussed hydration, potassium, supportive footwear.  Check labs to assess for electrolyte imbalance.  Will follow.  Fatigue- pt's labs in August were unrevealing.  Suspect his fatigue is due to noncompliance w/ CPAP and/or high stress levels.  Again, will check labs to determine if metabolic cause.  If not, will defer changes in depression meds and or CPAP compliance to PCP.

## 2018-08-18 LAB — MAGNESIUM: MAGNESIUM: 2 mg/dL (ref 1.5–2.5)

## 2018-08-18 LAB — HEPATIC FUNCTION PANEL
AG RATIO: 2.3 (calc) (ref 1.0–2.5)
ALT: 20 U/L (ref 9–46)
AST: 15 U/L (ref 10–35)
Albumin: 4.4 g/dL (ref 3.6–5.1)
Alkaline phosphatase (APISO): 67 U/L (ref 40–115)
BILIRUBIN TOTAL: 1.7 mg/dL — AB (ref 0.2–1.2)
Bilirubin, Direct: 0.3 mg/dL — ABNORMAL HIGH (ref 0.0–0.2)
GLOBULIN: 1.9 g/dL (ref 1.9–3.7)
Indirect Bilirubin: 1.4 mg/dL (calc) — ABNORMAL HIGH (ref 0.2–1.2)
Total Protein: 6.3 g/dL (ref 6.1–8.1)

## 2018-08-18 LAB — IRON,TIBC AND FERRITIN PANEL
%SAT: 24 % (ref 20–48)
FERRITIN: 147 ng/mL (ref 38–380)
Iron: 74 ug/dL (ref 50–180)
TIBC: 311 mcg/dL (calc) (ref 250–425)

## 2018-08-18 LAB — CBC WITH DIFFERENTIAL/PLATELET
Basophils Absolute: 39 cells/uL (ref 0–200)
Basophils Relative: 0.6 %
EOS PCT: 1.5 %
Eosinophils Absolute: 98 cells/uL (ref 15–500)
HCT: 41.5 % (ref 38.5–50.0)
HEMOGLOBIN: 14.3 g/dL (ref 13.2–17.1)
Lymphs Abs: 1807 cells/uL (ref 850–3900)
MCH: 29.3 pg (ref 27.0–33.0)
MCHC: 34.5 g/dL (ref 32.0–36.0)
MCV: 85 fL (ref 80.0–100.0)
MONOS PCT: 8.5 %
MPV: 11.7 fL (ref 7.5–12.5)
NEUTROS ABS: 4004 {cells}/uL (ref 1500–7800)
Neutrophils Relative %: 61.6 %
PLATELETS: 216 10*3/uL (ref 140–400)
RBC: 4.88 10*6/uL (ref 4.20–5.80)
RDW: 12.5 % (ref 11.0–15.0)
TOTAL LYMPHOCYTE: 27.8 %
WBC mixed population: 553 cells/uL (ref 200–950)
WBC: 6.5 10*3/uL (ref 3.8–10.8)

## 2018-08-18 LAB — BASIC METABOLIC PANEL
BUN: 15 mg/dL (ref 7–25)
CALCIUM: 9.3 mg/dL (ref 8.6–10.3)
CO2: 29 mmol/L (ref 20–32)
Chloride: 104 mmol/L (ref 98–110)
Creat: 1.3 mg/dL (ref 0.70–1.33)
Glucose, Bld: 100 mg/dL — ABNORMAL HIGH (ref 65–99)
POTASSIUM: 4.3 mmol/L (ref 3.5–5.3)
SODIUM: 142 mmol/L (ref 135–146)

## 2018-08-18 LAB — TSH: TSH: 0.66 mIU/L (ref 0.40–4.50)

## 2018-08-27 ENCOUNTER — Other Ambulatory Visit: Payer: Self-pay | Admitting: Physician Assistant

## 2018-09-03 ENCOUNTER — Telehealth: Payer: Self-pay

## 2018-09-04 ENCOUNTER — Telehealth: Payer: Self-pay | Admitting: Emergency Medicine

## 2018-09-04 NOTE — Telephone Encounter (Signed)
-----   Message from Brunetta Jeans, PA-C sent at 08/30/2018  9:50 PM EDT ----- Regarding: Check on Colonoscopy See below. Patient is due. ----- Message ----- From: Delorse Limber Sent: 06/12/2018   8:27 AM EDT To: Brunetta Jeans, PA-C  November -- call to see if he has called to schedule colonoscopy.

## 2018-09-04 NOTE — Telephone Encounter (Signed)
Error

## 2018-09-07 ENCOUNTER — Encounter: Payer: Self-pay | Admitting: Emergency Medicine

## 2018-09-07 NOTE — Telephone Encounter (Signed)
My chart message sent to patient. For Reminder

## 2018-09-13 ENCOUNTER — Telehealth: Payer: Self-pay | Admitting: Physician Assistant

## 2018-09-13 NOTE — Telephone Encounter (Signed)
Ok to refill. Citalopram 40 mg. Take 1 tab by mouth daily. Quantity 90 with 1 refill.

## 2018-09-13 NOTE — Telephone Encounter (Signed)
Copied from [place] [id number]. Topic: General - Other >> Sep 13, 2018  7:25 AM Lennox Solders wrote: Reason for CRM: pt had a physical 06-12-18 and per pt he is taking citalopram 1 pill instead of 1/2. [facility] [address]. Pt needs refills with new directions

## 2018-09-14 MED ORDER — CITALOPRAM HYDROBROMIDE 40 MG PO TABS: ORAL_TABLET | ORAL | 1 refills | Status: DC

## 2018-09-14 NOTE — Telephone Encounter (Signed)
Updated rx for Citalopram 40 mg 1 tab daily sent to the pharmacy per patient request

## 2018-09-26 ENCOUNTER — Ambulatory Visit: Payer: BC Managed Care – PPO | Admitting: Cardiovascular Disease

## 2018-10-18 ENCOUNTER — Ambulatory Visit: Payer: Self-pay

## 2018-10-18 ENCOUNTER — Telehealth: Payer: Self-pay | Admitting: Physician Assistant

## 2018-10-18 DIAGNOSIS — R110 Nausea: Secondary | ICD-10-CM

## 2018-10-18 MED ORDER — ONDANSETRON HCL 4 MG PO TABS: 4.0000 mg | ORAL_TABLET | Freq: Three times a day (TID) | ORAL | 0 refills | Status: DC

## 2018-10-18 NOTE — Telephone Encounter (Signed)
Pt states that he is just extremely nauseated and has no appetite.   He states that his kids have  a stomach bug with vomiting and diarrhea, and he's worried that it's about to hit him because he "feels like he could throw up at any minute"    Advised that I will send message to PCP to see if we are able to call in something for the nausea.

## 2018-10-18 NOTE — Telephone Encounter (Signed)
Ok to send in script for ondansetron 4 mg -- 1 tab TID PRN for nausea. Quantity 20 with 0 refills.

## 2018-10-18 NOTE — Telephone Encounter (Signed)
LM for patient to call to discuss symptoms.

## 2018-10-18 NOTE — Telephone Encounter (Signed)
Please call patient to assess current symptoms in detail

## 2018-10-18 NOTE — Telephone Encounter (Signed)
Pt awoke 2:00 am this morning with moderate nausea. Pt is not vomiting and is keeping fluids down. Pt is not having diarrhea. Pt  Suspects he has virus. Pt last voided this morning and he stated the inside of his mouth is dry. Care advice given to pt and pt verbalized understanding Pt is requesting [facility] [address].   Reason for Disposition . Unexplained nausea  Answer Assessment - Initial Assessment Questions 1. NAUSEA SEVERITY: "How bad is the nausea?" (e.g., mild, moderate, severe; dehydration, weight loss)   - MILD: loss of appetite without change in eating habits   - MODERATE: decreased oral intake without significant weight loss, dehydration, or malnutrition   - SEVERE: inadequate caloric or fluid intake, significant weight loss, symptoms of dehydration     moderate 2. ONSET: "When did the nausea begin?"     Early this am 0200 3. VOMITING: "Any vomiting?" If so, ask: "How many times today?"     no 4. RECURRENT SYMPTOM: "Have you had nausea before?" If so, ask: "When was the last time?" "What happened that time?"     Yes-last year- went away on it own 5. CAUSE: "What do you think is causing the nausea?"     Virus- 6. PREGNANCY: "Is there any chance you are pregnant?" (e.g., unprotected intercourse, missed birth control pill, broken condom)     n/a  Protocols used: NAUSEA-A-AH

## 2018-10-18 NOTE — Telephone Encounter (Signed)
Spoke with patient to let him know medication has been ordered.   Patient was appreciative.

## 2018-10-18 NOTE — Addendum Note (Signed)
Addended by: Katina Dung on: 10/18/2018 03:07 PM   Modules accepted: Orders

## 2018-10-18 NOTE — Telephone Encounter (Unsigned)
Copied from [place] #[id number]. Topic: Quick Communication - See Telephone Encounter >> Oct 18, 2018  8:00 AM Hewitt Shorts wrote: Pt is having stomach issues and is needing something called in for nausea -possibly zofran  Best number [phone]  [facility] [address]

## 2018-11-09 ENCOUNTER — Encounter: Payer: Self-pay | Admitting: Gastroenterology

## 2018-11-19 ENCOUNTER — Other Ambulatory Visit: Payer: Self-pay | Admitting: Dermatology

## 2018-11-25 ENCOUNTER — Other Ambulatory Visit: Payer: Self-pay | Admitting: Physician Assistant

## 2018-11-25 DIAGNOSIS — E785 Hyperlipidemia, unspecified: Secondary | ICD-10-CM

## 2018-12-10 ENCOUNTER — Ambulatory Visit (AMBULATORY_SURGERY_CENTER): Payer: Self-pay | Admitting: *Deleted

## 2018-12-10 VITALS — Ht 74.0 in | Wt 225.0 lb

## 2018-12-10 DIAGNOSIS — Z1211 Encounter for screening for malignant neoplasm of colon: Secondary | ICD-10-CM

## 2018-12-10 MED ORDER — NA SULFATE-K SULFATE-MG SULF 17.5-3.13-1.6 GM/177ML PO SOLN: ORAL | 0 refills | Status: DC

## 2018-12-10 NOTE — Progress Notes (Signed)
Patient denies any allergies to eggs or soy. Patient denies any problems with anesthesia/sedation. Patient denies any oxygen use at home. Patient denies taking any diet/weight loss medications or blood thinners. EMMI offered, pt declined. suprep $15 off coupon given to pt.

## 2018-12-11 ENCOUNTER — Encounter: Payer: Self-pay | Admitting: Internal Medicine

## 2018-12-14 ENCOUNTER — Telehealth: Payer: Self-pay | Admitting: Emergency Medicine

## 2018-12-14 LAB — POCT URINALYSIS DIP (MANUAL ENTRY)
Bilirubin, UA: NEGATIVE
Blood, UA: NEGATIVE
GLUCOSE UA: NEGATIVE mg/dL
Ketones, POC UA: NEGATIVE mg/dL
Leukocytes, UA: NEGATIVE
Nitrite, UA: NEGATIVE
Spec Grav, UA: >=1.03 — AB (ref 1.010–1.025)
Urobilinogen, UA: 0.2 E.U./dL
pH, UA: 6 (ref 5.0–8.0)

## 2018-12-17 ENCOUNTER — Encounter: Payer: BC Managed Care – PPO | Admitting: Internal Medicine

## 2018-12-21 ENCOUNTER — Encounter: Payer: Self-pay | Admitting: Internal Medicine

## 2018-12-21 ENCOUNTER — Ambulatory Visit (AMBULATORY_SURGERY_CENTER): Payer: BC Managed Care – PPO | Admitting: Internal Medicine

## 2018-12-21 VITALS — BP 125/72 | HR 71 | Temp 97.5°F | Resp 18 | Ht 74.0 in | Wt 225.0 lb

## 2018-12-21 DIAGNOSIS — D124 Benign neoplasm of descending colon: Secondary | ICD-10-CM

## 2018-12-21 DIAGNOSIS — D122 Benign neoplasm of ascending colon: Secondary | ICD-10-CM

## 2018-12-21 DIAGNOSIS — Z8371 Family history of colonic polyps: Secondary | ICD-10-CM

## 2018-12-21 DIAGNOSIS — Z1211 Encounter for screening for malignant neoplasm of colon: Secondary | ICD-10-CM

## 2018-12-21 DIAGNOSIS — K635 Polyp of colon: Secondary | ICD-10-CM | POA: Diagnosis not present

## 2018-12-21 MED ORDER — SODIUM CHLORIDE 0.9 % IV SOLN: 500.0000 mL | INTRAVENOUS | Status: DC

## 2018-12-21 NOTE — Progress Notes (Signed)
Called to room to assist during endoscopic procedure.  Patient ID and intended procedure confirmed with present staff. Received instructions for my participation in the procedure from the performing physician.  

## 2018-12-21 NOTE — Patient Instructions (Signed)
YOU HAD AN ENDOSCOPIC PROCEDURE TODAY AT THE [facility]:   Refer to the procedure report that was given to you for any specific questions about what was found during the examination.  If the procedure report does not answer your questions, please call your gastroenterologist to clarify.  If you requested that your care partner not be given the details of your procedure findings, then the procedure report has been included in a sealed envelope for you to review at your convenience later.  YOU SHOULD EXPECT: Some feelings of bloating in the abdomen. Passage of more gas than usual.  Walking can help get rid of the air that was put into your GI tract during the procedure and reduce the bloating. If you had a lower endoscopy (such as a colonoscopy or flexible sigmoidoscopy) you may notice spotting of blood in your stool or on the toilet paper. If you underwent a bowel prep for your procedure, you may not have a normal bowel movement for a few days.  Please Note:  You might notice some irritation and congestion in your nose or some drainage.  This is from the oxygen used during your procedure.  There is no need for concern and it should clear up in a day or so.  SYMPTOMS TO REPORT IMMEDIATELY:   Following lower endoscopy (colonoscopy or flexible sigmoidoscopy):  Excessive amounts of blood in the stool  Significant tenderness or worsening of abdominal pains  Swelling of the abdomen that is new, acute  Fever of 100F or higher  For urgent or emergent issues, a gastroenterologist can be reached at any hour by calling [phone].   DIET:  We do recommend a small meal at first, but then you may proceed to your regular diet.  Drink plenty of fluids but you should avoid alcoholic beverages for 24 hours.  ACTIVITY:  You should plan to take it easy for the rest of today and you should NOT DRIVE or use heavy machinery until tomorrow (because of the sedation medicines used during the test).     FOLLOW UP: Our staff will call the number listed on your records the next business day following your procedure to check on you and address any questions or concerns that you may have regarding the information given to you following your procedure. If we do not reach you, we will leave a message.  However, if you are feeling well and you are not experiencing any problems, there is no need to return our call.  We will assume that you have returned to your regular daily activities without incident.  If any biopsies were taken you will be contacted by phone or by letter within the next 1-3 weeks.  Please call us at [phone] if you have not heard about the biopsies in 3 weeks.    SIGNATURES/CONFIDENTIALITY: You and/or your care partner have signed paperwork which will be entered into your electronic medical record.  These signatures attest to the fact that that the information above on your After Visit Summary has been reviewed and is understood.  Full responsibility of the confidentiality of this discharge information lies with you and/or your care-partner. 

## 2018-12-21 NOTE — Progress Notes (Signed)
A/ox3, pleased with MAC, report to RN 

## 2018-12-21 NOTE — Op Note (Signed)
[facility] Patient Name: Nezar Buckles Procedure Date: 12/21/2018 3:08 PM MRN: [id number] Endoscopist: Docia Chuck. Henrene Pastor , MD Age: 53 Referring MD:  Date of Birth: 07-12-1966 Gender: Male Account #: [id number] Procedure:                Colonoscopy with cold snare polypectomy x 1 Indications:              Screening for colorectal malignant neoplasm.                            Reports negative index exam at age 24 (twin brother                            had adenomatous polyps at 59) with Dr. Gala Romney in                            [place]:                Monitored Anesthesia Care Procedure:                Pre-Anesthesia Assessment:                           - Prior to the procedure, a History and Physical                            was performed, and patient medications and                            allergies were reviewed. The patient's tolerance of                            previous anesthesia was also reviewed. The risks                            and benefits of the procedure and the sedation                            options and risks were discussed with the patient.                            All questions were answered, and informed consent                            was obtained. Prior Anticoagulants: The patient has                            taken no previous anticoagulant or antiplatelet                            agents. ASA Grade Assessment: II - A patient with                            mild systemic disease. After reviewing the risks  and benefits, the patient was deemed in                            satisfactory condition to undergo the procedure.                           After obtaining informed consent, the colonoscope                            was passed under direct vision. Throughout the                            procedure, the patient's blood pressure, pulse, and                            oxygen saturations were  monitored continuously. The                            Colonoscope was introduced through the anus and                            advanced to the the cecum, identified by                            appendiceal orifice and ileocecal valve. The                            ileocecal valve, appendiceal orifice, and rectum                            were photographed. The quality of the bowel                            preparation was excellent. The colonoscopy was                            performed without difficulty. The patient tolerated                            the procedure well. The bowel preparation used was                            SUPREP. Scope In: 3:14:58 PM Scope Out: 3:27:05 PM Scope Withdrawal Time: 0 hours 9 minutes 6 seconds  Total Procedure Duration: 0 hours 12 minutes 7 seconds  Findings:                 Two polyps were found in the descending colon and                            ascending colon. The polyps were 3 to 5 mm in size.                            These polyps were removed with a cold snare.  Resection and retrieval were complete.                           The exam was otherwise without abnormality on                            direct and retroflexion views. Complications:            No immediate complications. Estimated blood loss:                            None. Estimated Blood Loss:     Estimated blood loss: none. Impression:               - Two 3 to 5 mm polyps in the descending colon and                            in the ascending colon, removed with a cold snare.                            Resected and retrieved.                           - The examination was otherwise normal on direct                            and retroflexion views. Recommendation:           - Repeat colonoscopy in 5 years for surveillance.                           - Patient has a contact number available for                            emergencies. The  signs and symptoms of potential                            delayed complications were discussed with the                            patient. Return to normal activities tomorrow.                            Written discharge instructions were provided to the                            patient.                           - Resume previous diet.                           - Continue present medications.                           - Await pathology results. Docia Chuck. Henrene Pastor, MD 12/21/2018 3:31:10 PM This report has been  signed electronically.

## 2018-12-24 ENCOUNTER — Encounter: Payer: BC Managed Care – PPO | Admitting: Gastroenterology

## 2018-12-24 ENCOUNTER — Telehealth: Payer: Self-pay | Admitting: *Deleted

## 2018-12-24 NOTE — Telephone Encounter (Signed)
  Follow up Call-  Call back number 12/21/2018  Post procedure Call Back phone  # [phone]  Permission to leave phone message Yes  Some recent data might be hidden     Patient questions:  Do you have a fever, pain , or abdominal swelling? No. Pain Score  0 *  Have you tolerated food without any problems? Yes.    Have you been able to return to your normal activities? Yes.    Do you have any questions about your discharge instructions: Diet   No. Medications  No. Follow up visit  No.  Do you have questions or concerns about your Care? No.  Actions: * If pain score is 4 or above: No action needed, pain <4.

## 2018-12-26 ENCOUNTER — Encounter: Payer: Self-pay | Admitting: Internal Medicine

## 2018-12-31 ENCOUNTER — Other Ambulatory Visit: Payer: Self-pay | Admitting: Cardiology

## 2018-12-31 DIAGNOSIS — R002 Palpitations: Secondary | ICD-10-CM

## 2019-01-17 ENCOUNTER — Encounter: Payer: Self-pay | Admitting: Internal Medicine

## 2019-01-21 ENCOUNTER — Telehealth: Payer: Self-pay | Admitting: Internal Medicine

## 2019-01-21 DIAGNOSIS — R002 Palpitations: Secondary | ICD-10-CM

## 2019-01-21 MED ORDER — METOPROLOL SUCCINATE ER 25 MG PO TB24: 12.5000 mg | ORAL_TABLET | Freq: Every day | ORAL | 3 refills | Status: DC

## 2019-01-21 NOTE — Telephone Encounter (Signed)
Sent new refill on Metoprolol. All other meds filled by PCP.

## 2019-01-21 NOTE — Telephone Encounter (Signed)
Spoke to patient    Doing good   Occasional palpitations   Limited Needs refills on meds  Otherwise I would set to see him at the end of summer, early fall  (end of august/ early September)

## 2019-01-25 ENCOUNTER — Ambulatory Visit: Payer: BC Managed Care – PPO | Admitting: Internal Medicine

## 2019-01-26 ENCOUNTER — Other Ambulatory Visit: Payer: Self-pay | Admitting: Physician Assistant

## 2019-01-26 DIAGNOSIS — M25561 Pain in right knee: Principal | ICD-10-CM

## 2019-01-26 DIAGNOSIS — G8929 Other chronic pain: Secondary | ICD-10-CM

## 2019-01-26 DIAGNOSIS — M25562 Pain in left knee: Principal | ICD-10-CM

## 2019-02-15 NOTE — Telephone Encounter (Signed)
Error

## 2019-02-20 ENCOUNTER — Encounter: Payer: Self-pay | Admitting: Emergency Medicine

## 2019-02-20 ENCOUNTER — Other Ambulatory Visit: Payer: Self-pay | Admitting: Physician Assistant

## 2019-02-20 DIAGNOSIS — F329 Major depressive disorder, single episode, unspecified: Secondary | ICD-10-CM

## 2019-02-20 DIAGNOSIS — F32A Depression, unspecified: Secondary | ICD-10-CM

## 2019-02-20 DIAGNOSIS — F419 Anxiety disorder, unspecified: Principal | ICD-10-CM

## 2019-02-20 NOTE — Telephone Encounter (Signed)
Patient is due for follow up visit for Anxiety/Depression.

## 2019-02-22 ENCOUNTER — Other Ambulatory Visit: Payer: Self-pay

## 2019-02-22 ENCOUNTER — Ambulatory Visit (INDEPENDENT_AMBULATORY_CARE_PROVIDER_SITE_OTHER): Payer: BC Managed Care – PPO | Admitting: Physician Assistant

## 2019-02-22 ENCOUNTER — Encounter: Payer: Self-pay | Admitting: Physician Assistant

## 2019-02-22 ENCOUNTER — Telehealth: Payer: Self-pay | Admitting: Physician Assistant

## 2019-02-22 DIAGNOSIS — F5102 Adjustment insomnia: Secondary | ICD-10-CM | POA: Diagnosis not present

## 2019-02-22 MED ORDER — ALPRAZOLAM 0.25 MG PO TABS: 0.2500 mg | ORAL_TABLET | Freq: Every evening | ORAL | 0 refills | Status: DC | PRN

## 2019-02-22 NOTE — Progress Notes (Signed)
I have discussed the procedure for the virtual visit with the patient who has given consent to proceed with assessment and treatment.   Barbette Mcglaun S Sanders Manninen, CMA     

## 2019-02-22 NOTE — Telephone Encounter (Signed)
I would need to see him to prescribe but I am more than happy to do so. Can we set him up for phone visit? That way we can designate a time to talk about it and make sure he is ok
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
## 2019-02-22 NOTE — Telephone Encounter (Signed)
Copied from [place] [id number]. Topic: Quick Communication - See Telephone Encounter >> Feb 22, 2019  1:57 PM Nils Flack wrote: CRM for notification. See Telephone encounter for: 02/22/19. Pt is asking for call back from [place].   Would not give details  Cb is [phone]

## 2019-02-22 NOTE — Telephone Encounter (Signed)
Patient scheduled for a phone visit today

## 2019-03-22 ENCOUNTER — Ambulatory Visit (INDEPENDENT_AMBULATORY_CARE_PROVIDER_SITE_OTHER): Payer: BC Managed Care – PPO | Admitting: Physician Assistant

## 2019-03-22 ENCOUNTER — Encounter: Payer: Self-pay | Admitting: Physician Assistant

## 2019-03-22 ENCOUNTER — Other Ambulatory Visit: Payer: Self-pay

## 2019-03-22 DIAGNOSIS — B9689 Other specified bacterial agents as the cause of diseases classified elsewhere: Secondary | ICD-10-CM

## 2019-03-22 DIAGNOSIS — J019 Acute sinusitis, unspecified: Secondary | ICD-10-CM

## 2019-03-22 MED ORDER — FLUTICASONE PROPIONATE 50 MCG/ACT NA SUSP: 2.0000 | Freq: Every day | NASAL | 0 refills | Status: DC

## 2019-03-22 MED ORDER — DOXYCYCLINE HYCLATE 100 MG PO CAPS: 100.0000 mg | ORAL_CAPSULE | Freq: Two times a day (BID) | ORAL | 0 refills | Status: DC

## 2019-03-22 NOTE — Progress Notes (Signed)
Virtual Visit via Video   I connected with patient on 03/22/19 at 10:20 AM EDT by a video enabled telemedicine application and verified that I am speaking with the correct person using two identifiers.  Location patient: Home Location provider: [facility], Office Persons participating in the virtual visit: Patient, Provider, [place] (Patina Moore)  I discussed the limitations of evaluation and management by telemedicine and the availability of in person appointments. The patient expressed understanding and agreed to proceed.  Subjective:   HPI:   Patient presents via [facility] today c/o several days of frontal/maxillary sinus pressure now turned into sinus pain and tooth pain over the past few days. Denies fever, chills, aches or ear pain. Denies chest congestion but notes nasal congestion and PND. Denies chest tightness or wheezing.  Denies recent travel or sick contact. Worried that he did not  Clean CPAP well which is causing this. Is taking Tylenol Sinus with only some relief in symptoms.   ROS:   See pertinent positives and negatives per HPI.  Patient Active Problem List   Diagnosis Date Noted   Visit for preventive health examination 06/12/2018   Prostate cancer screening 06/12/2018   Obstructive sleep apnea 03/02/2017   Palpitations 03/02/2017   Restless leg syndrome 11/14/2016   Generalized anxiety disorder 02/24/2015   Hyperlipidemia LDL goal <130 02/24/2015   Hypertriglyceridemia 02/24/2015    Social History   Tobacco Use   Smoking status: Never Smoker   Smokeless tobacco: Never Used  Substance Use Topics   Alcohol use: Yes    Comment: occ beer    Current Outpatient Medications:    ALPRAZolam (XANAX) 0.25 MG tablet, Take 1 tablet (0.25 mg total) by mouth at bedtime as needed for sleep. May take an additional tablet in the day for severe acute anxiety only, Disp: 20 tablet, Rfl: 0   atorvastatin (LIPITOR) 20 MG tablet, TAKE 1 TABLET (20 MG  TOTAL) BY MOUTH DAILY AT 6 PM., Disp: 90 tablet, Rfl: 1   citalopram (CELEXA) 40 MG tablet, TAKE 1/2 TABLET BY MOUTH DAILY., Disp: 30 tablet, Rfl: 0   gabapentin (NEURONTIN) 300 MG capsule, TAKE 1 CAPSULE BY MOUTH EVERYDAY AT BEDTIME, Disp: 90 capsule, Rfl: 1   metoprolol succinate (TOPROL-XL) 25 MG 24 hr tablet, Take 0.5 tablets (12.5 mg total) by mouth daily., Disp: 45 tablet, Rfl: 3   Multiple Vitamin (MULTIVITAMIN) tablet, Take 1 tablet by mouth daily., Disp: , Rfl:    ondansetron (ZOFRAN) 4 MG tablet, Take 1 tablet (4 mg total) by mouth 3 (three) times daily., Disp: 20 tablet, Rfl: 0   tiZANidine (ZANAFLEX) 4 MG tablet, Take 1 tablet (4 mg total) by mouth every 6 (six) hours as needed for muscle spasms., Disp: 30 tablet, Rfl: 0   valACYclovir (VALTREX) 500 MG tablet, TAKE 4 TABLETS BY MOUTH AT ONSET AND 4 12 HOURS LATER, Disp: , Rfl:   Allergies  Allergen Reactions   Augmentin [Amoxicillin-Pot Clavulanate] Diarrhea    Objective:   There were no vitals taken for this visit.  Patient is well-developed, well-nourished in no acute distress.  Resting comfortably at home.  Head is normocephalic, atraumatic.  No labored breathing.  Speech is clear and coherent with logical content.  Patient is alert and oriented at baseline.  + TTP frontal sinuses on examination with patient assistance  Assessment and Plan:   1. Acute bacterial sinusitis Rx Doxycycline.  Increase fluids.  Rest.  Saline nasal spray.  Probiotic.  Mucinex as directed.  Humidifier in  bedroom. Flonase per orders.  Call or return to clinic if symptoms are not improving.  - fluticasone (FLONASE) 50 MCG/ACT nasal spray; Place 2 sprays into both nostrils daily.  Dispense: 16 g; Refill: 0 - doxycycline (VIBRAMYCIN) 100 MG capsule; Take 1 capsule (100 mg total) by mouth 2 (two) times daily.  Dispense: 20 capsule; Refill: 0 .   Leeanne Rio, [place] 03/22/2019

## 2019-03-22 NOTE — Progress Notes (Signed)
I have discussed the procedure for the virtual visit with the patient who has given consent to proceed with assessment and treatment.   Jamae Tison S Rocsi Hazelbaker, CMA     

## 2019-03-22 NOTE — Patient Instructions (Signed)
Instructions sent to MyChart.   Please take antibiotic as directed.  Increase fluid intake.  Use Saline nasal spray.  Take a daily multivitamin. Start the Flonase once daily. Ok to continue the Tylenol Sinus for now.  Place a humidifier in the bedroom.  Please call or return clinic if symptoms are not improving.  Sinusitis Sinusitis is redness, soreness, and swelling (inflammation) of the paranasal sinuses. Paranasal sinuses are air pockets within the bones of your face (beneath the eyes, the middle of the forehead, or above the eyes). In healthy paranasal sinuses, mucus is able to drain out, and air is able to circulate through them by way of your nose. However, when your paranasal sinuses are inflamed, mucus and air can become trapped. This can allow bacteria and other germs to grow and cause infection. Sinusitis can develop quickly and last only a short time (acute) or continue over a long period (chronic). Sinusitis that lasts for more than 12 weeks is considered chronic.  CAUSES  Causes of sinusitis include:  Allergies.  Structural abnormalities, such as displacement of the cartilage that separates your nostrils (deviated septum), which can decrease the air flow through your nose and sinuses and affect sinus drainage.  Functional abnormalities, such as when the small hairs (cilia) that line your sinuses and help remove mucus do not work properly or are not present. SYMPTOMS  Symptoms of acute and chronic sinusitis are the same. The primary symptoms are pain and pressure around the affected sinuses. Other symptoms include:  Upper toothache.  Earache.  Headache.  Bad breath.  Decreased sense of smell and taste.  A cough, which worsens when you are lying flat.  Fatigue.  Fever.  Thick drainage from your nose, which often is green and may contain pus (purulent).  Swelling and warmth over the affected sinuses. DIAGNOSIS  Your caregiver will perform a physical exam. During the  exam, your caregiver may:  Look in your nose for signs of abnormal growths in your nostrils (nasal polyps).  Tap over the affected sinus to check for signs of infection.  View the inside of your sinuses (endoscopy) with a special imaging device with a light attached (endoscope), which is inserted into your sinuses. If your caregiver suspects that you have chronic sinusitis, one or more of the following tests may be recommended:  Allergy tests.  Nasal culture A sample of mucus is taken from your nose and sent to a lab and screened for bacteria.  Nasal cytology A sample of mucus is taken from your nose and examined by your caregiver to determine if your sinusitis is related to an allergy. TREATMENT  Most cases of acute sinusitis are related to a viral infection and will resolve on their own within 10 days. Sometimes medicines are prescribed to help relieve symptoms (pain medicine, decongestants, nasal steroid sprays, or saline sprays).  However, for sinusitis related to a bacterial infection, your caregiver will prescribe antibiotic medicines. These are medicines that will help kill the bacteria causing the infection.  Rarely, sinusitis is caused by a fungal infection. In theses cases, your caregiver will prescribe antifungal medicine. For some cases of chronic sinusitis, surgery is needed. Generally, these are cases in which sinusitis recurs more than 3 times per year, despite other treatments. HOME CARE INSTRUCTIONS   Drink plenty of water. Water helps thin the mucus so your sinuses can drain more easily.  Use a humidifier.  Inhale steam 3 to 4 times a day (for example, sit in the bathroom  with the shower running).  Apply a warm, moist washcloth to your face 3 to 4 times a day, or as directed by your caregiver.  Use saline nasal sprays to help moisten and clean your sinuses.  Take over-the-counter or prescription medicines for pain, discomfort, or fever only as directed by your  caregiver. SEEK IMMEDIATE MEDICAL CARE IF:  You have increasing pain or severe headaches.  You have nausea, vomiting, or drowsiness.  You have swelling around your face.  You have vision problems.  You have a stiff neck.  You have difficulty breathing. MAKE SURE YOU:   Understand these instructions.  Will watch your condition.  Will get help right away if you are not doing well or get worse. Document Released: 10/17/2005 Document Revised: 01/09/2012 Document Reviewed: 11/01/2011 [facility] Patient Information 2014 [place], [place].

## 2019-04-10 ENCOUNTER — Telehealth: Payer: Self-pay | Admitting: Physician Assistant

## 2019-04-10 NOTE — Telephone Encounter (Signed)
Advised patient since his wife and daughter has been tested, will wait until results come back for further assessment. He is agreeable. He states he does have a sore throat but no other symptoms. He is agreeable with self quarantine until results are back.

## 2019-04-10 NOTE — Telephone Encounter (Signed)
Pt states that he had been in contact with someone who has tested positive for COVID and asking what does he need to do to be tested. Pt states that he has no symptoms

## 2019-04-10 NOTE — Telephone Encounter (Signed)
We have different options. Since he is asymptomatic and we are testing his family members who are symptomatic, I would recommend he self quarantine at home until we get their results and go from there to determine further quarantine or need for testing him personally.

## 2019-04-11 ENCOUNTER — Other Ambulatory Visit: Payer: Self-pay | Admitting: Physician Assistant

## 2019-04-11 DIAGNOSIS — F5102 Adjustment insomnia: Secondary | ICD-10-CM

## 2019-04-12 NOTE — Telephone Encounter (Signed)
Xanax last rx 02/22/19 #20 LOV: 02/22/19 Insomnia

## 2019-04-13 ENCOUNTER — Other Ambulatory Visit: Payer: Self-pay | Admitting: Physician Assistant

## 2019-04-13 DIAGNOSIS — B9689 Other specified bacterial agents as the cause of diseases classified elsewhere: Secondary | ICD-10-CM

## 2019-04-21 ENCOUNTER — Other Ambulatory Visit: Payer: Self-pay | Admitting: Physician Assistant

## 2019-04-21 DIAGNOSIS — F419 Anxiety disorder, unspecified: Secondary | ICD-10-CM

## 2019-04-21 DIAGNOSIS — F329 Major depressive disorder, single episode, unspecified: Secondary | ICD-10-CM

## 2019-04-25 ENCOUNTER — Encounter: Payer: Self-pay | Admitting: Physician Assistant

## 2019-04-25 ENCOUNTER — Other Ambulatory Visit: Payer: Self-pay

## 2019-04-25 ENCOUNTER — Ambulatory Visit (INDEPENDENT_AMBULATORY_CARE_PROVIDER_SITE_OTHER): Payer: BC Managed Care – PPO | Admitting: Physician Assistant

## 2019-04-25 DIAGNOSIS — M5441 Lumbago with sciatica, right side: Secondary | ICD-10-CM

## 2019-04-25 MED ORDER — GABAPENTIN 100 MG PO CAPS: ORAL_CAPSULE | ORAL | 0 refills | Status: DC

## 2019-04-25 MED ORDER — CYCLOBENZAPRINE HCL 10 MG PO TABS: 10.0000 mg | ORAL_TABLET | Freq: Three times a day (TID) | ORAL | 0 refills | Status: DC | PRN

## 2019-04-25 MED ORDER — MELOXICAM 15 MG PO TABS: 15.0000 mg | ORAL_TABLET | Freq: Every day | ORAL | 0 refills | Status: DC

## 2019-04-25 NOTE — Patient Instructions (Signed)
Instructions sent to MyChart.  Please take it easy! No lifting or overexertion! No exercise right now. Take the Meloxicam once daily for the next few days. Start Gabapentin 100 mg in the morning and at lunch/1pm. Continue your regular dose in the evening. The muscle relaxant (Flexeril) is for evening use.  Ease back in to a sitting position when able. Get out of bed when you feel ready to do so but do not go overboard.  Message me tomorrow to let me know how you are feeling.  ER for any worsening symptoms or development of numbness, weakness in the legs or numbness in the groin.

## 2019-04-25 NOTE — Progress Notes (Signed)
Virtual Visit via Video   I connected with patient on 04/25/19 at  8:30 AM EDT by a video enabled telemedicine application and verified that I am speaking with the correct person using two identifiers.  Location patient: Home Location provider: [facility], Office Persons participating in the virtual visit: Patient, Provider, PA-Student Anibal Henderson), CMA (Eduard Clos)  I discussed the limitations of evaluation and management by telemedicine and the availability of in person appointments. The patient expressed understanding and agreed to proceed.  Subjective:   HPI:   Patient endorses pain starting after lifting some dumbells. Notes pain in bilateral lower back which is making it very hard for him to get up out of bed this morning,. Notes some radiation intermittently into his RLE. Denies saddle anesthesia. Denies numbness, weakness.  Denies any bruising. Has taken old muscle relaxant - took 1 last night with some improvement.    ROS:   See pertinent positives and negatives per HPI.  Patient Active Problem List   Diagnosis Date Noted  . Visit for preventive health examination 06/12/2018  . Prostate cancer screening 06/12/2018  . Obstructive sleep apnea 03/02/2017  . Palpitations 03/02/2017  . Restless leg syndrome 11/14/2016  . Generalized anxiety disorder 02/24/2015  . Hyperlipidemia LDL goal <130 02/24/2015  . Hypertriglyceridemia 02/24/2015    Social History   Tobacco Use  . Smoking status: Never Smoker  . Smokeless tobacco: Never Used  Substance Use Topics  . Alcohol use: Yes    Comment: occ beer    Current Outpatient Medications:  .  ALPRAZolam (XANAX) 0.25 MG tablet, TAKE 1 TABLET (0.25 MG TOTAL) BY MOUTH AT BEDTIME AS NEEDED FOR SLEEP. MAY TAKE AN ADDITIONAL TABLET IN THE DAY FOR SEVERE ACUTE ANXIETY ONLY, Disp: 20 tablet, Rfl: 0 .  atorvastatin (LIPITOR) 20 MG tablet, TAKE 1 TABLET (20 MG TOTAL) BY MOUTH DAILY AT 6 PM., Disp: 90 tablet, Rfl: 1 .   citalopram (CELEXA) 40 MG tablet, TAKE 1 TABLET BY MOUTH EVERY DAY, Disp: 90 tablet, Rfl: 1 .  fluticasone (FLONASE) 50 MCG/ACT nasal spray, SPRAY 2 SPRAYS INTO EACH NOSTRIL EVERY DAY, Disp: 16 g, Rfl: 2 .  gabapentin (NEURONTIN) 300 MG capsule, TAKE 1 CAPSULE BY MOUTH EVERYDAY AT BEDTIME, Disp: 90 capsule, Rfl: 1 .  metoprolol succinate (TOPROL-XL) 25 MG 24 hr tablet, Take 0.5 tablets (12.5 mg total) by mouth daily., Disp: 45 tablet, Rfl: 3 .  Multiple Vitamin (MULTIVITAMIN) tablet, Take 1 tablet by mouth daily., Disp: , Rfl:  .  ondansetron (ZOFRAN) 4 MG tablet, Take 1 tablet (4 mg total) by mouth 3 (three) times daily., Disp: 20 tablet, Rfl: 0 .  valACYclovir (VALTREX) 500 MG tablet, TAKE 4 TABLETS BY MOUTH AT ONSET AND 4 12 HOURS LATER, Disp: , Rfl:  .  cyclobenzaprine (FLEXERIL) 10 MG tablet, Take 1 tablet (10 mg total) by mouth 3 (three) times daily as needed for muscle spasms., Disp: 30 tablet, Rfl: 0 .  gabapentin (NEURONTIN) 100 MG capsule, 1 cap PO QAM and 1 cap PO at noon., Disp: 20 capsule, Rfl: 0 .  meloxicam (MOBIC) 15 MG tablet, Take 1 tablet (15 mg total) by mouth daily., Disp: 15 tablet, Rfl: 0  Allergies  Allergen Reactions  . Augmentin [Amoxicillin-Pot Clavulanate] Diarrhea    Objective:   There were no vitals taken for this visit.  Patient is well-developed, well-nourished in no acute distress.  Resting comfortably at home.  Head is normocephalic, atraumatic.  No labored breathing.  Speech is  clear and coherent with logical contest.  Patient is alert and oriented at baseline.   Assessment and Plan:   1. Acute bilateral low back pain with right-sided sciatica With absence of alarm signs/symptoms. Will start Meloxicam and Flexeril. He already takes 300 mg Gabapentin nightly. Will add on 100 mg AM and noon. Supportive measures reviewed. Strict ER precautions reviewed with patient and wife. Will follow-up via phone tomorrow. - gabapentin (NEURONTIN) 100 MG capsule; 1  cap PO QAM and 1 cap PO at noon.  Dispense: 20 capsule; Refill: 0 - meloxicam (MOBIC) 15 MG tablet; Take 1 tablet (15 mg total) by mouth daily.  Dispense: 15 tablet; Refill: 0 - cyclobenzaprine (FLEXERIL) 10 MG tablet; Take 1 tablet (10 mg total) by mouth 3 (three) times daily as needed for muscle spasms.  Dispense: 30 tablet; Refill: 0    Francisco Pena, [place] 04/25/2019

## 2019-04-25 NOTE — Progress Notes (Signed)
I have discussed the procedure for the virtual visit with the patient who has given consent to proceed with assessment and treatment.   Francisco Pena S [name], CMA     

## 2019-05-06 ENCOUNTER — Other Ambulatory Visit: Payer: Self-pay | Admitting: Physician Assistant

## 2019-05-06 DIAGNOSIS — M5441 Lumbago with sciatica, right side: Secondary | ICD-10-CM

## 2019-05-19 ENCOUNTER — Other Ambulatory Visit: Payer: Self-pay | Admitting: Physician Assistant

## 2019-05-19 DIAGNOSIS — E785 Hyperlipidemia, unspecified: Secondary | ICD-10-CM

## 2019-06-04 ENCOUNTER — Other Ambulatory Visit: Payer: Self-pay | Admitting: Physician Assistant

## 2019-06-11 ENCOUNTER — Encounter: Payer: Self-pay | Admitting: Physician Assistant

## 2019-06-11 ENCOUNTER — Other Ambulatory Visit: Payer: Self-pay

## 2019-06-11 ENCOUNTER — Ambulatory Visit (INDEPENDENT_AMBULATORY_CARE_PROVIDER_SITE_OTHER): Payer: BC Managed Care – PPO | Admitting: Physician Assistant

## 2019-06-11 ENCOUNTER — Telehealth: Payer: Self-pay | Admitting: Physician Assistant

## 2019-06-11 VITALS — Wt 226.0 lb

## 2019-06-11 DIAGNOSIS — F411 Generalized anxiety disorder: Secondary | ICD-10-CM | POA: Diagnosis not present

## 2019-06-11 MED ORDER — SERTRALINE HCL 100 MG PO TABS: 150.0000 mg | ORAL_TABLET | Freq: Every day | ORAL | 1 refills | Status: DC

## 2019-06-11 NOTE — Telephone Encounter (Signed)
He will need a video visit appointment.

## 2019-06-11 NOTE — Progress Notes (Signed)
I have discussed the procedure for the virtual visit with the patient who has given consent to proceed with assessment and treatment.   Francisco Pena S Jameila Keeny, CMA     

## 2019-06-11 NOTE — Telephone Encounter (Signed)
Pt has been scheduled.  °

## 2019-06-11 NOTE — Progress Notes (Signed)
   Virtual Visit via Video   I connected with patient on 06/11/19 at  4:00 PM EDT by a video enabled telemedicine application and verified that I am speaking with the correct person using two identifiers.  Location patient: Home Location provider: [facility], Office Persons participating in the virtual visit: Patient, Provider, [place] (Patina Moore)  I discussed the limitations of evaluation and management by telemedicine and the availability of in person appointments. The patient expressed understanding and agreed to proceed.  Subjective:   HPI:   Patient presents via Doxy.Me today c/o increased levels of generalized anxiety overall. Denies panic attack. Denies depressed mood, anhedonia, SI/HI. Has been on a regimen of Citalopram 40 mg daily for 8+ years. Notes this has done well for him but over the past several months feeling like it is not working as well as it used to. Denies change to appetite. Is keeping active -- eating healthy and working out.Notes some change    ROS:   See pertinent positives and negatives per HPI.  Patient Active Problem List   Diagnosis Date Noted  . Visit for preventive health examination 06/12/2018  . Prostate cancer screening 06/12/2018  . Obstructive sleep apnea 03/02/2017  . Palpitations 03/02/2017  . Restless leg syndrome 11/14/2016  . Generalized anxiety disorder 02/24/2015  . Hyperlipidemia LDL goal <130 02/24/2015  . Hypertriglyceridemia 02/24/2015    Social History   Tobacco Use  . Smoking status: Never Smoker  . Smokeless tobacco: Never Used  Substance Use Topics  . Alcohol use: Yes    Comment: occ beer    Current Outpatient Medications:  .  ALPRAZolam (XANAX) 0.25 MG tablet, TAKE 1 TABLET (0.25 MG TOTAL) BY MOUTH AT BEDTIME AS NEEDED FOR SLEEP. MAY TAKE AN ADDITIONAL TABLET IN THE DAY FOR SEVERE ACUTE ANXIETY ONLY, Disp: 20 tablet, Rfl: 0 .  atorvastatin (LIPITOR) 20 MG tablet, TAKE 1 TABLET (20 MG TOTAL) BY MOUTH DAILY AT 6  PM., Disp: 90 tablet, Rfl: 1 .  citalopram (CELEXA) 40 MG tablet, TAKE 1 TABLET BY MOUTH EVERY DAY, Disp: 90 tablet, Rfl: 1 .  fluticasone (FLONASE) 50 MCG/ACT nasal spray, SPRAY 2 SPRAYS INTO EACH NOSTRIL EVERY DAY, Disp: 16 g, Rfl: 2 .  gabapentin (NEURONTIN) 100 MG capsule, 1 cap PO QAM and 1 cap PO at noon., Disp: 20 capsule, Rfl: 0 .  gabapentin (NEURONTIN) 300 MG capsule, TAKE 1 CAPSULE BY MOUTH EVERYDAY AT BEDTIME, Disp: 90 capsule, Rfl: 1 .  metoprolol succinate (TOPROL-XL) 25 MG 24 hr tablet, Take 0.5 tablets (12.5 mg total) by mouth daily., Disp: 45 tablet, Rfl: 3 .  Multiple Vitamin (MULTIVITAMIN) tablet, Take 1 tablet by mouth daily., Disp: , Rfl:   Allergies  Allergen Reactions  . Augmentin [Amoxicillin-Pot Clavulanate] Diarrhea    Objective:   There were no vitals taken for this visit.  Patient is well-developed, well-nourished in no acute distress.  Resting comfortably at home.  Head is normocephalic, atraumatic.  No labored breathing.  Speech is clear and coherent with logical content.  Patient is alert and oriented at baseline.   Assessment and Plan:   1. Generalized anxiety disorder Will switch Citalopram 40 to Sertraline 150 mg daily. No cross-taper needed due to same class of medicine. Close follow-up scheduled. Will increase to 200 mg at follow-up if tolerating without issue.     Leeanne Rio, PA-C 06/11/2019

## 2019-06-11 NOTE — Telephone Encounter (Signed)
Patient called and would like to talk to [facility] about changing his medication citalopram (CELEXA) 40 MG tablet due to he feels that its not working. Please call patient back, thanks.

## 2019-06-11 NOTE — Patient Instructions (Signed)
Instructions sent to MyChart.  Please start the Sertraline tomorrow taking as directed. No need to wean off of the Citalopram since the two medications in the the same pharmalogical class.  If you note any nausea, change in erectile function or libido, not improving after the first several doses, let us know. If you note any worsening in mood or anxiety with medicine, contact us immediately.   Follow-up with me via video visit in 4-5 weeks, sooner if needed.  Take care!

## 2019-06-25 ENCOUNTER — Other Ambulatory Visit: Payer: Self-pay | Admitting: Physician Assistant

## 2019-06-25 DIAGNOSIS — M5441 Lumbago with sciatica, right side: Secondary | ICD-10-CM

## 2019-06-25 DIAGNOSIS — F5102 Adjustment insomnia: Secondary | ICD-10-CM

## 2019-06-26 NOTE — Telephone Encounter (Signed)
Last refill Mobic: 6.25.20 #15, 0  Xanax:  6.12.20 #20, 0  Last OV: 8.11.20 dx. GAD

## 2019-07-03 ENCOUNTER — Other Ambulatory Visit: Payer: Self-pay

## 2019-07-03 ENCOUNTER — Encounter: Payer: Self-pay | Admitting: Physician Assistant

## 2019-07-03 ENCOUNTER — Ambulatory Visit (INDEPENDENT_AMBULATORY_CARE_PROVIDER_SITE_OTHER): Payer: BC Managed Care – PPO | Admitting: Physician Assistant

## 2019-07-03 VITALS — Ht 74.0 in | Wt 221.0 lb

## 2019-07-03 DIAGNOSIS — J0100 Acute maxillary sinusitis, unspecified: Secondary | ICD-10-CM | POA: Diagnosis not present

## 2019-07-03 MED ORDER — DOXYCYCLINE HYCLATE 100 MG PO CAPS: 100.0000 mg | ORAL_CAPSULE | Freq: Two times a day (BID) | ORAL | 0 refills | Status: DC

## 2019-07-03 NOTE — Progress Notes (Signed)
I have discussed the procedure for the virtual visit with the patient who has given consent to proceed with assessment and treatment.   Enis Leatherwood S Rosita Guzzetta, CMA     

## 2019-07-03 NOTE — Patient Instructions (Signed)
Please take antibiotic as directed.  Increase fluid intake.  Use Saline nasal spray.  Take a daily multivitamin. Restart Flonase. Continue Claritin-D.  Place a humidifier in the bedroom.  Please call or return clinic if symptoms are not improving.  Sinusitis Sinusitis is redness, soreness, and swelling (inflammation) of the paranasal sinuses. Paranasal sinuses are air pockets within the bones of your face (beneath the eyes, the middle of the forehead, or above the eyes). In healthy paranasal sinuses, mucus is able to drain out, and air is able to circulate through them by way of your nose. However, when your paranasal sinuses are inflamed, mucus and air can become trapped. This can allow bacteria and other germs to grow and cause infection. Sinusitis can develop quickly and last only a short time (acute) or continue over a long period (chronic). Sinusitis that lasts for more than 12 weeks is considered chronic.  CAUSES  Causes of sinusitis include:  Allergies.  Structural abnormalities, such as displacement of the cartilage that separates your nostrils (deviated septum), which can decrease the air flow through your nose and sinuses and affect sinus drainage.  Functional abnormalities, such as when the small hairs (cilia) that line your sinuses and help remove mucus do not work properly or are not present. SYMPTOMS  Symptoms of acute and chronic sinusitis are the same. The primary symptoms are pain and pressure around the affected sinuses. Other symptoms include:  Upper toothache.  Earache.  Headache.  Bad breath.  Decreased sense of smell and taste.  A cough, which worsens when you are lying flat.  Fatigue.  Fever.  Thick drainage from your nose, which often is green and may contain pus (purulent).  Swelling and warmth over the affected sinuses. DIAGNOSIS  Your caregiver will perform a physical exam. During the exam, your caregiver may:  Look in your nose for signs of abnormal  growths in your nostrils (nasal polyps).  Tap over the affected sinus to check for signs of infection.  View the inside of your sinuses (endoscopy) with a special imaging device with a light attached (endoscope), which is inserted into your sinuses. If your caregiver suspects that you have chronic sinusitis, one or more of the following tests may be recommended:  Allergy tests.  Nasal culture A sample of mucus is taken from your nose and sent to a lab and screened for bacteria.  Nasal cytology A sample of mucus is taken from your nose and examined by your caregiver to determine if your sinusitis is related to an allergy. TREATMENT  Most cases of acute sinusitis are related to a viral infection and will resolve on their own within 10 days. Sometimes medicines are prescribed to help relieve symptoms (pain medicine, decongestants, nasal steroid sprays, or saline sprays).  However, for sinusitis related to a bacterial infection, your caregiver will prescribe antibiotic medicines. These are medicines that will help kill the bacteria causing the infection.  Rarely, sinusitis is caused by a fungal infection. In theses cases, your caregiver will prescribe antifungal medicine. For some cases of chronic sinusitis, surgery is needed. Generally, these are cases in which sinusitis recurs more than 3 times per year, despite other treatments. HOME CARE INSTRUCTIONS   Drink plenty of water. Water helps thin the mucus so your sinuses can drain more easily.  Use a humidifier.  Inhale steam 3 to 4 times a day (for example, sit in the bathroom with the shower running).  Apply a warm, moist washcloth to your face 3 to  4 times a day, or as directed by your caregiver.  Use saline nasal sprays to help moisten and clean your sinuses.  Take over-the-counter or prescription medicines for pain, discomfort, or fever only as directed by your caregiver. SEEK IMMEDIATE MEDICAL CARE IF:  You have increasing pain or  severe headaches.  You have nausea, vomiting, or drowsiness.  You have swelling around your face.  You have vision problems.  You have a stiff neck.  You have difficulty breathing. MAKE SURE YOU:   Understand these instructions.  Will watch your condition.  Will get help right away if you are not doing well or get worse. Document Released: 10/17/2005 Document Revised: 01/09/2012 Document Reviewed: 11/01/2011 [facility] Patient Information 2014 [place], [place].

## 2019-07-03 NOTE — Progress Notes (Signed)
Virtual Visit via Video   I connected with patient on 07/03/19 at 11:30 AM EDT by a video enabled telemedicine application and verified that I am speaking with the correct person using two identifiers.  Location patient: Home Location provider: [facility], Office Persons participating in the virtual visit: Patient, Provider, [place] (Patina Moore)  I discussed the limitations of evaluation and management by telemedicine and the availability of in person appointments. The patient expressed understanding and agreed to proceed.  Subjective:   HPI:   Patient presents via Doxy.Me today c/o nasal congestion, sinus pressure, PND, headache and ear pain (mainly L) over the past week or so. Now with maxillary sinus pain and tooth pain. Denies fever, chest congestion, cough. Denies dizziness or lightheadedness. Denies recent travel or sick contact. Has been social distancing. Negative COVID screen. Has been taking   Claritin-D which helps some with symptoms. Not taking his Flonase.     ROS:   See pertinent positives and negatives per HPI.  Patient Active Problem List   Diagnosis Date Noted  . Visit for preventive health examination 06/12/2018  . Prostate cancer screening 06/12/2018  . Obstructive sleep apnea 03/02/2017  . Palpitations 03/02/2017  . Restless leg syndrome 11/14/2016  . Generalized anxiety disorder 02/24/2015  . Hyperlipidemia LDL goal <130 02/24/2015  . Hypertriglyceridemia 02/24/2015    Social History   Tobacco Use  . Smoking status: Never Smoker  . Smokeless tobacco: Never Used  Substance Use Topics  . Alcohol use: Yes    Comment: occ beer    Current Outpatient Medications:  .  ALPRAZolam (XANAX) 0.25 MG tablet, 1 TAB AT BEDTIME AS NEEDED FOR SLEEP. MAY TAKE AN ADDITIONAL TAB IN THE DAY FOR SEVERE ACUTE ANXIETY, Disp: 20 tablet, Rfl: 1 .  atorvastatin (LIPITOR) 20 MG tablet, TAKE 1 TABLET (20 MG TOTAL) BY MOUTH DAILY AT 6 PM., Disp: 90 tablet, Rfl: 1 .   gabapentin (NEURONTIN) 300 MG capsule, TAKE 1 CAPSULE BY MOUTH EVERYDAY AT BEDTIME, Disp: 90 capsule, Rfl: 1 .  metoprolol succinate (TOPROL-XL) 25 MG 24 hr tablet, Take 0.5 tablets (12.5 mg total) by mouth daily., Disp: 45 tablet, Rfl: 3 .  Multiple Vitamin (MULTIVITAMIN) tablet, Take 1 tablet by mouth daily., Disp: , Rfl:  .  sertraline (ZOLOFT) 100 MG tablet, Take 1.5 tablets (150 mg total) by mouth daily., Disp: 45 tablet, Rfl: 1 .  fluticasone (FLONASE) 50 MCG/ACT nasal spray, SPRAY 2 SPRAYS INTO EACH NOSTRIL EVERY DAY (Patient not taking: Reported on 07/03/2019), Disp: 16 g, Rfl: 2  Allergies  Allergen Reactions  . Augmentin [Amoxicillin-Pot Clavulanate] Diarrhea    Objective:   Ht 6\' 2"  (1.88 m)   Wt 221 lb (100.2 kg)   BMI 28.37 kg/m   Patient is well-developed, well-nourished in no acute distress.  Resting comfortably at home.  Head is normocephalic, atraumatic.  No labored breathing.  Speech is clear and coherent with logical contest.  Patient is alert and oriented at baseline.  + TTP maxillary sinuses on exam (patient performed)  Assessment and Plan:  1. Acute non-recurrent maxillary sinusitis Rx Doxycycline.  Increase fluids.  Rest.  Saline nasal spray.  Probiotic.  Mucinex as directed.  Humidifier in bedroom. Restart Flonase. Continue Claritin-D.  Call or return to clinic if symptoms are not improving.  - doxycycline (VIBRAMYCIN) 100 MG capsule; Take 1 capsule (100 mg total) by mouth 2 (two) times daily.  Dispense: 20 capsule; Refill: 0 .   Leeanne Rio, PA-C 07/03/2019

## 2019-07-04 ENCOUNTER — Other Ambulatory Visit: Payer: Self-pay | Admitting: Physician Assistant

## 2019-07-29 ENCOUNTER — Encounter: Payer: Self-pay | Admitting: Physician Assistant

## 2019-07-29 ENCOUNTER — Other Ambulatory Visit: Payer: Self-pay

## 2019-07-29 ENCOUNTER — Ambulatory Visit (INDEPENDENT_AMBULATORY_CARE_PROVIDER_SITE_OTHER): Payer: BC Managed Care – PPO

## 2019-07-29 ENCOUNTER — Ambulatory Visit (INDEPENDENT_AMBULATORY_CARE_PROVIDER_SITE_OTHER): Payer: BC Managed Care – PPO | Admitting: Physician Assistant

## 2019-07-29 VITALS — Wt 221.0 lb

## 2019-07-29 DIAGNOSIS — R350 Frequency of micturition: Secondary | ICD-10-CM | POA: Diagnosis not present

## 2019-07-29 LAB — POCT URINALYSIS DIPSTICK
Bilirubin, UA: NEGATIVE
Blood, UA: NEGATIVE
Glucose, UA: NEGATIVE
Ketones, UA: NEGATIVE
Leukocytes, UA: NEGATIVE
Nitrite, UA: NEGATIVE
Protein, UA: POSITIVE — AB
Spec Grav, UA: 1.025 (ref 1.010–1.025)
Urobilinogen, UA: 0.2 E.U./dL
pH, UA: 6 (ref 5.0–8.0)

## 2019-07-29 LAB — URINALYSIS, ROUTINE W REFLEX MICROSCOPIC
Bilirubin Urine: NEGATIVE
Ketones, ur: NEGATIVE
Leukocytes,Ua: NEGATIVE
Nitrite: NEGATIVE
Specific Gravity, Urine: 1.025 (ref 1.000–1.030)
Total Protein, Urine: NEGATIVE
Urine Glucose: NEGATIVE
Urobilinogen, UA: 0.2 (ref 0.0–1.0)
pH: 6 (ref 5.0–8.0)

## 2019-07-29 NOTE — Progress Notes (Signed)
I have discussed the procedure for the virtual visit with the patient who has given consent to proceed with assessment and treatment.   Erleen Egner S Amberly Livas, CMA     

## 2019-07-29 NOTE — Progress Notes (Signed)
   Virtual Visit via Video   I connected with patient on 07/29/19 at 11:30 AM EDT by a video enabled telemedicine application and verified that I am speaking with the correct person using two identifiers.  Location patient: Home Location provider: [facility], Office Persons participating in the virtual visit: Patient, Provider, [place] (Patina Moore)  I discussed the limitations of evaluation and management by telemedicine and the availability of in person appointments. The patient expressed understanding and agreed to proceed.  Subjective:   HPI:   Patient presents via Doxy.Me c/o 3 days of urinary urgency, frequency. Denies dysuria, hematuria, nausea/vomiting or flank pain. Denies urinary hesitancy or incomplete bladder emptying. Denies change to bowel habits. Denies saddle anesthesia. Has taken AZO with some improvement.  Denies penile pain or discharge. Denies concern for STI.  MRI scheduled for tomorrow at 5PM due to issues with cervical spine.    ROS:   See pertinent positives and negatives per HPI.  Patient Active Problem List   Diagnosis Date Noted  . Visit for preventive health examination 06/12/2018  . Prostate cancer screening 06/12/2018  . Obstructive sleep apnea 03/02/2017  . Palpitations 03/02/2017  . Restless leg syndrome 11/14/2016  . Generalized anxiety disorder 02/24/2015  . Hyperlipidemia LDL goal <130 02/24/2015  . Hypertriglyceridemia 02/24/2015    Social History   Tobacco Use  . Smoking status: Never Smoker  . Smokeless tobacco: Never Used  Substance Use Topics  . Alcohol use: Yes    Comment: occ beer    Current Outpatient Medications:  .  ALPRAZolam (XANAX) 0.25 MG tablet, 1 TAB AT BEDTIME AS NEEDED FOR SLEEP. MAY TAKE AN ADDITIONAL TAB IN THE DAY FOR SEVERE ACUTE ANXIETY, Disp: 20 tablet, Rfl: 1 .  atorvastatin (LIPITOR) 20 MG tablet, TAKE 1 TABLET (20 MG TOTAL) BY MOUTH DAILY AT 6 PM., Disp: 90 tablet, Rfl: 1 .  gabapentin (NEURONTIN)  300 MG capsule, TAKE 1 CAPSULE BY MOUTH EVERYDAY AT BEDTIME, Disp: 90 capsule, Rfl: 1 .  metoprolol succinate (TOPROL-XL) 25 MG 24 hr tablet, Take 0.5 tablets (12.5 mg total) by mouth daily., Disp: 45 tablet, Rfl: 3 .  Multiple Vitamin (MULTIVITAMIN) tablet, Take 1 tablet by mouth daily., Disp: , Rfl:  .  sertraline (ZOLOFT) 100 MG tablet, TAKE 1.5 TABLETS BY MOUTH DAILY, Disp: 135 tablet, Rfl: 0  Allergies  Allergen Reactions  . Augmentin [Amoxicillin-Pot Clavulanate] Diarrhea    Objective:   Wt 221 lb (100.2 kg)   BMI 28.37 kg/m   Patient is well-developed, well-nourished in no acute distress.  Resting comfortably at home.  Head is normocephalic, atraumatic.  No labored breathing.  Speech is clear and coherent with logical content.  Patient is alert and oriented at baseline.   Assessment and Plan:   1. Urinary frequency Will have patient come to lab for UA and culture to further assess giving mild symptoms and absence of further UTI symptoms. Has history of lumbar DDD so change in urination could also be from this (thankfully no alarm signs) or potentially OAB. He is to hydrate. Avoid foods that irritate bladder - list reviewed with patient. Will start treatment based on lab results today.    Leeanne Rio, PA-C 07/29/2019

## 2019-07-31 ENCOUNTER — Other Ambulatory Visit: Payer: Self-pay

## 2019-07-31 ENCOUNTER — Ambulatory Visit (INDEPENDENT_AMBULATORY_CARE_PROVIDER_SITE_OTHER): Payer: BC Managed Care – PPO | Admitting: Physician Assistant

## 2019-07-31 ENCOUNTER — Encounter: Payer: Self-pay | Admitting: Physician Assistant

## 2019-07-31 DIAGNOSIS — K219 Gastro-esophageal reflux disease without esophagitis: Secondary | ICD-10-CM

## 2019-07-31 DIAGNOSIS — R61 Generalized hyperhidrosis: Secondary | ICD-10-CM

## 2019-07-31 LAB — URINE CULTURE
MICRO NUMBER:: 929791
Result:: NO GROWTH
SPECIMEN QUALITY:: ADEQUATE

## 2019-07-31 MED ORDER — PANTOPRAZOLE SODIUM 40 MG PO TBEC: 40.0000 mg | DELAYED_RELEASE_TABLET | Freq: Every day | ORAL | 1 refills | Status: DC

## 2019-07-31 NOTE — Progress Notes (Signed)
I have discussed the procedure for the virtual visit with the patient who has given consent to proceed with assessment and treatment.   Willis Kuipers S Andruw Battie, CMA     

## 2019-07-31 NOTE — Patient Instructions (Signed)
Instructions sent to MyChart.   -Please take the Pantoprazole once daily as directed. -Take an OTC Pepcid twice daily for the next 2-3 days while the other medication gets into your system.  -Follow dietary recommendations below  -We will see you tomorrow for your lab appointment. We will call with results once in.  - Try to turn down the thermostat another 1-2 degrees tonight to help with night sweating while we are assessing further.    Food Choices for Gastroesophageal Reflux Disease, Adult When you have gastroesophageal reflux disease (GERD), the foods you eat and your eating habits are very important. Choosing the right foods can help ease your discomfort. Think about working with a nutrition specialist (dietitian) to help you make good choices. What are tips for following this plan?  Meals  Choose healthy foods that are low in fat, such as fruits, vegetables, whole grains, low-fat dairy products, and lean meat, fish, and poultry.  Eat small meals often instead of 3 large meals a day. Eat your meals slowly, and in a place where you are relaxed. Avoid bending over or lying down until 2-3 hours after eating.  Avoid eating meals 2-3 hours before bed.  Avoid drinking a lot of liquid with meals.  Cook foods using methods other than frying. Bake, grill, or broil food instead.  Avoid or limit: ? Chocolate. ? Peppermint or spearmint. ? Alcohol. ? Pepper. ? Black and decaffeinated coffee. ? Black and decaffeinated tea. ? Bubbly (carbonated) soft drinks. ? Caffeinated energy drinks and soft drinks.  Limit high-fat foods such as: ? Fatty meat or fried foods. ? Whole milk, cream, butter, or ice cream. ? Nuts and nut butters. ? Pastries, donuts, and sweets made with butter or shortening.  Avoid foods that cause symptoms. These foods may be different for everyone. Common foods that cause symptoms include: ? Tomatoes. ? Oranges, lemons, and limes. ? Peppers. ? Spicy food. ?  Onions and garlic. ? Vinegar. Lifestyle  Maintain a healthy weight. Ask your doctor what weight is healthy for you. If you need to lose weight, work with your doctor to do so safely.  Exercise for at least 30 minutes for 5 or more days each week, or as told by your doctor.  Wear loose-fitting clothes.  Do not smoke. If you need help quitting, ask your doctor.  Sleep with the head of your bed higher than your feet. Use a wedge under the mattress or blocks under the bed frame to raise the head of the bed. Summary  When you have gastroesophageal reflux disease (GERD), food and lifestyle choices are very important in easing your symptoms.  Eat small meals often instead of 3 large meals a day. Eat your meals slowly, and in a place where you are relaxed.  Limit high-fat foods such as fatty meat or fried foods.  Avoid bending over or lying down until 2-3 hours after eating.  Avoid peppermint and spearmint, caffeine, alcohol, and chocolate. This information is not intended to replace advice given to you by your health care provider. Make sure you discuss any questions you have with your health care provider. Document Released: 04/17/2012 Document Revised: 02/07/2019 Document Reviewed: 11/22/2016 Elsevier Patient Education  2020 [employer].

## 2019-07-31 NOTE — Progress Notes (Signed)
   Virtual Visit via Video   I connected with patient on 07/31/19 at  4:15 PM EDT by a video enabled telemedicine application and verified that I am speaking with the correct person using two identifiers.  Location patient: Home Location provider: [facility], Office Persons participating in the virtual visit: Patient, Provider, [place] (Patina Moore)  I discussed the limitations of evaluation and management by telemedicine and the availability of in person appointments. The patient expressed understanding and agreed to proceed.  Subjective:   HPI:  Patient presents via Doxy.Me today with multiple complaints.   Patient endorses issue with heartburn over the past month. Endorses daily symptoms, lasting throughout most of the day. Endorses burping, belching, nausea. Denies abdominal pain, vomiting, fever, chills. Denies change to diet. Tries to avoid late night eating. Has cut back on exercise.  Denies NSAID use. No alcohol. Has been taking OTC Maalox, Rolaids, Nexium OTC.   1 week of night sweats mainly upper body from waist up. No change in weight. Keeps fan going on at night. Denies known fever.   ROS:   See pertinent positives and negatives per HPI.  Patient Active Problem List   Diagnosis Date Noted  . Visit for preventive health examination 06/12/2018  . Prostate cancer screening 06/12/2018  . Obstructive sleep apnea 03/02/2017  . Palpitations 03/02/2017  . Restless leg syndrome 11/14/2016  . Generalized anxiety disorder 02/24/2015  . Hyperlipidemia LDL goal <130 02/24/2015  . Hypertriglyceridemia 02/24/2015    Social History   Tobacco Use  . Smoking status: Never Smoker  . Smokeless tobacco: Never Used  Substance Use Topics  . Alcohol use: Yes    Comment: occ beer    Current Outpatient Medications:  .  ALPRAZolam (XANAX) 0.25 MG tablet, 1 TAB AT BEDTIME AS NEEDED FOR SLEEP. MAY TAKE AN ADDITIONAL TAB IN THE DAY FOR SEVERE ACUTE ANXIETY, Disp: 20 tablet, Rfl: 1  .  atorvastatin (LIPITOR) 20 MG tablet, TAKE 1 TABLET (20 MG TOTAL) BY MOUTH DAILY AT 6 PM., Disp: 90 tablet, Rfl: 1 .  gabapentin (NEURONTIN) 300 MG capsule, TAKE 1 CAPSULE BY MOUTH EVERYDAY AT BEDTIME, Disp: 90 capsule, Rfl: 1 .  metoprolol succinate (TOPROL-XL) 25 MG 24 hr tablet, Take 0.5 tablets (12.5 mg total) by mouth daily., Disp: 45 tablet, Rfl: 3 .  Multiple Vitamin (MULTIVITAMIN) tablet, Take 1 tablet by mouth daily., Disp: , Rfl:  .  sertraline (ZOLOFT) 100 MG tablet, TAKE 1.5 TABLETS BY MOUTH DAILY, Disp: 135 tablet, Rfl: 0 .  pantoprazole (PROTONIX) 40 MG tablet, Take 1 tablet (40 mg total) by mouth daily., Disp: 30 tablet, Rfl: 1  Allergies  Allergen Reactions  . Augmentin [Amoxicillin-Pot Clavulanate] Diarrhea    Objective:   There were no vitals taken for this visit.  Patient is well-developed, well-nourished in no acute distress.  Resting comfortably at home.  Head is normocephalic, atraumatic.  No labored breathing.  Speech is clear and coherent with logical contest.  Patient is alert and oriented at baseline.    Assessment and Plan:   1. Gastroesophageal reflux disease without esophagitis GERD diet reviewed. Pepcid BID x 2-3 days. Begin Protonix 40 mg QD x 2 weeks.Close follow-up scheduled.   2. Night sweats X 1 week. No fever or weight loss. Will have him come in to lab for further assessment. Labs ordered. - CBC w/Diff; Future - Comp Met (CMET); Future - TSH; Future - C-reactive protein; Future    Leeanne Rio, PA-C 07/31/2019

## 2019-08-01 ENCOUNTER — Ambulatory Visit (INDEPENDENT_AMBULATORY_CARE_PROVIDER_SITE_OTHER): Payer: BC Managed Care – PPO

## 2019-08-01 DIAGNOSIS — R61 Generalized hyperhidrosis: Secondary | ICD-10-CM

## 2019-08-01 LAB — CBC WITH DIFFERENTIAL/PLATELET
Basophils Absolute: 0 10*3/uL (ref 0.0–0.1)
Basophils Relative: 0.4 % (ref 0.0–3.0)
Eosinophils Absolute: 0.2 10*3/uL (ref 0.0–0.7)
Eosinophils Relative: 3.1 % (ref 0.0–5.0)
HCT: 44.9 % (ref 39.0–52.0)
Hemoglobin: 15.3 g/dL (ref 13.0–17.0)
Lymphocytes Relative: 20.2 % (ref 12.0–46.0)
Lymphs Abs: 1.6 10*3/uL (ref 0.7–4.0)
MCHC: 34 g/dL (ref 30.0–36.0)
MCV: 86.4 fl (ref 78.0–100.0)
Monocytes Absolute: 1 10*3/uL (ref 0.1–1.0)
Monocytes Relative: 12.1 % — ABNORMAL HIGH (ref 3.0–12.0)
Neutro Abs: 5.1 10*3/uL (ref 1.4–7.7)
Neutrophils Relative %: 64.2 % (ref 43.0–77.0)
Platelets: 196 10*3/uL (ref 150.0–400.0)
RBC: 5.2 Mil/uL (ref 4.22–5.81)
RDW: 13.4 % (ref 11.5–15.5)
WBC: 8 10*3/uL (ref 4.0–10.5)

## 2019-08-01 LAB — COMPREHENSIVE METABOLIC PANEL
ALT: 20 U/L (ref 0–53)
AST: 15 U/L (ref 0–37)
Albumin: 4.6 g/dL (ref 3.5–5.2)
Alkaline Phosphatase: 80 U/L (ref 39–117)
BUN: 23 mg/dL (ref 6–23)
CO2: 31 mEq/L (ref 19–32)
Calcium: 9.9 mg/dL (ref 8.4–10.5)
Chloride: 102 mEq/L (ref 96–112)
Creatinine, Ser: 1.18 mg/dL (ref 0.40–1.50)
GFR: 65.04 mL/min (ref >60.00)
Glucose, Bld: 92 mg/dL (ref 70–99)
Potassium: 4.3 mEq/L (ref 3.5–5.1)
Sodium: 142 mEq/L (ref 135–145)
Total Bilirubin: 1.6 mg/dL — ABNORMAL HIGH (ref 0.2–1.2)
Total Protein: 6.8 g/dL (ref 6.0–8.3)

## 2019-08-01 LAB — TSH: TSH: 2.01 u[IU]/mL (ref 0.35–4.50)

## 2019-08-01 LAB — C-REACTIVE PROTEIN: CRP: <1 mg/dL (ref 0.5–20.0)

## 2019-08-22 ENCOUNTER — Telehealth: Payer: Self-pay | Admitting: Physician Assistant

## 2019-08-22 ENCOUNTER — Other Ambulatory Visit: Payer: Self-pay | Admitting: Physician Assistant

## 2019-08-22 NOTE — Telephone Encounter (Signed)
Pt's wife called in stating that he has been having some acid reflux issues that he wanted to see what all they could do. They state that he has talked with cody in the past about this. Please call Melissa

## 2019-08-22 NOTE — Telephone Encounter (Signed)
LMOVM advising if patient was still taking the Protonix (Pantoprazole) daily or if he stopped after 2 weeks. He was advised by Einar Pheasant to take Pepcid bid for 2-3 days. Did symptom improve after medication and worsened after stopping or just started back.

## 2019-08-23 NOTE — Telephone Encounter (Signed)
Patient requested refill of the Protonix. Sent to the pharmacy will contact patient if symptoms improved since restarting medication

## 2019-08-29 ENCOUNTER — Other Ambulatory Visit: Payer: Self-pay | Admitting: Physician Assistant

## 2019-08-29 DIAGNOSIS — M25562 Pain in left knee: Secondary | ICD-10-CM

## 2019-08-29 DIAGNOSIS — G8929 Other chronic pain: Secondary | ICD-10-CM

## 2019-09-17 ENCOUNTER — Encounter: Payer: Self-pay | Admitting: Physician Assistant

## 2019-09-17 ENCOUNTER — Ambulatory Visit (INDEPENDENT_AMBULATORY_CARE_PROVIDER_SITE_OTHER): Payer: BC Managed Care – PPO | Admitting: Physician Assistant

## 2019-09-17 ENCOUNTER — Other Ambulatory Visit: Payer: Self-pay

## 2019-09-17 VITALS — Temp 97.3°F | Wt 215.0 lb

## 2019-09-17 DIAGNOSIS — K219 Gastro-esophageal reflux disease without esophagitis: Secondary | ICD-10-CM | POA: Diagnosis not present

## 2019-09-17 DIAGNOSIS — R61 Generalized hyperhidrosis: Secondary | ICD-10-CM

## 2019-09-17 NOTE — Progress Notes (Signed)
Virtual Visit via Video   I connected with patient on 09/17/19 at  9:00 AM EST by a video enabled telemedicine application and verified that I am speaking with the correct person using two identifiers.  Location patient: Home Location provider: [facility], Office Persons participating in the virtual visit: Patient, Provider, PA-Student Drucilla Schmidt), CMA (Eduard Clos)  I discussed the limitations of evaluation and management by telemedicine and the availability of in person appointments. The patient expressed understanding and agreed to proceed.  Subjective:   HPI:   Patient presents via Doxy.Me for c/o night sweats. Occurs 4-5 nights per week, usually wakes up around 2:30-3 AM. Was initially just from the waist up, now notes sweating from head to toe. Endorses soaking through clothes and bedding. Also notes occasional loose stools every couple of weeks, no blood in stool or urine. Has a history of sleep apnea, typically wears CPAP but has not been wearing it lately due to recent neck surgery. Denies fever, chills, unintentional weight loss, dizziness, lightheadedness.   Endorses "knot" in his epigastric region, constant, worsens at night and with spicy foods. Takes Protonix 40mg  QHS for GERD as directed which provides relief from burning but does not help with sensation in epigastrium. No vomiting.     ROS:   See pertinent positives and negatives per HPI.  Patient Active Problem List   Diagnosis Date Noted  . Visit for preventive health examination 06/12/2018  . Prostate cancer screening 06/12/2018  . Obstructive sleep apnea 03/02/2017  . Palpitations 03/02/2017  . Restless leg syndrome 11/14/2016  . Generalized anxiety disorder 02/24/2015  . Hyperlipidemia LDL goal <130 02/24/2015  . Hypertriglyceridemia 02/24/2015    Social History   Tobacco Use  . Smoking status: Never Smoker  . Smokeless tobacco: Never Used  Substance Use Topics  . Alcohol use: Yes   Comment: occ beer    Current Outpatient Medications:  .  ALPRAZolam (XANAX) 0.25 MG tablet, 1 TAB AT BEDTIME AS NEEDED FOR SLEEP. MAY TAKE AN ADDITIONAL TAB IN THE DAY FOR SEVERE ACUTE ANXIETY, Disp: 20 tablet, Rfl: 1 .  atorvastatin (LIPITOR) 20 MG tablet, TAKE 1 TABLET (20 MG TOTAL) BY MOUTH DAILY AT 6 PM., Disp: 90 tablet, Rfl: 1 .  gabapentin (NEURONTIN) 300 MG capsule, TAKE 1 CAPSULE BY MOUTH EVERYDAY AT BEDTIME, Disp: 90 capsule, Rfl: 1 .  metoprolol succinate (TOPROL-XL) 25 MG 24 hr tablet, Take 0.5 tablets (12.5 mg total) by mouth daily., Disp: 45 tablet, Rfl: 3 .  Multiple Vitamin (MULTIVITAMIN) tablet, Take 1 tablet by mouth daily., Disp: , Rfl:  .  pantoprazole (PROTONIX) 40 MG tablet, TAKE 1 TABLET BY MOUTH EVERY DAY, Disp: 90 tablet, Rfl: 1 .  sertraline (ZOLOFT) 100 MG tablet, TAKE 1.5 TABLETS BY MOUTH DAILY, Disp: 135 tablet, Rfl: 0  Allergies  Allergen Reactions  . Augmentin [Amoxicillin-Pot Clavulanate] Diarrhea    Objective:   Temp (!) 97.3 F (36.3 C) (Temporal)   Wt 215 lb (97.5 kg)   BMI 27.60 kg/m   Patient is well-developed, well-nourished in no acute distress.  Resting comfortably at home.  Head is normocephalic, atraumatic.  No labored breathing.  Speech is clear and coherent with logical content.  Patient is alert and oriented at baseline.   Assessment and Plan:   1. Night sweats Labs unremarkable. No fever or weight loss. SSRI could be contributing but less likely giving absence of daytime sweats. Not on CPAP currently and OSA could be contributing. He is to follow-up  with Pulmonology regarding this. Will check CXR today. If negative will have him see GI due to ongoing reflux to make sure these are not connected (mass, etc). - DG Chest 2 View; Future  2. Gastroesophageal reflux disease without esophagitis Continue PPI. Add on Famotidine 20 mg nightly. Awaiting imaging results, will then proceed with referral to GI.  Marland Kitchen   Leeanne Rio, PA-C  09/17/2019

## 2019-09-17 NOTE — Progress Notes (Signed)
I have discussed the procedure for the virtual visit with the patient who has given consent to proceed with assessment and treatment.   Dalyn Kjos S Cierria Height, CMA     

## 2019-09-17 NOTE — Patient Instructions (Addendum)
-  You will be contacted to schedule your x-ray at our [facility] office.  -I will call once results are in.  -I have sent a message to Dr.Young regarding symptoms and issue with CPAP. I am awaiting response but recommend you also reach out to them.  -Please continue Protonix daily. Also take 20 mg over-the-counter Famotidine each evening. Continue GERD diet.  If workup unremarkable for sweats, will refer you to GI for further assessment giving reflux symptoms (want to make sure these are not connected).   Food Choices for Gastroesophageal Reflux Disease, Adult When you have gastroesophageal reflux disease (GERD), the foods you eat and your eating habits are very important. Choosing the right foods can help ease your discomfort. Think about working with a nutrition specialist (dietitian) to help you make good choices. What are tips for following this plan?  Meals  Choose healthy foods that are low in fat, such as fruits, vegetables, whole grains, low-fat dairy products, and lean meat, fish, and poultry.  Eat small meals often instead of 3 large meals a day. Eat your meals slowly, and in a place where you are relaxed. Avoid bending over or lying down until 2-3 hours after eating.  Avoid eating meals 2-3 hours before bed.  Avoid drinking a lot of liquid with meals.  Cook foods using methods other than frying. Bake, grill, or broil food instead.  Avoid or limit: ? Chocolate. ? Peppermint or spearmint. ? Alcohol. ? Pepper. ? Black and decaffeinated coffee. ? Black and decaffeinated tea. ? Bubbly (carbonated) soft drinks. ? Caffeinated energy drinks and soft drinks.  Limit high-fat foods such as: ? Fatty meat or fried foods. ? Whole milk, cream, butter, or ice cream. ? Nuts and nut butters. ? Pastries, donuts, and sweets made with butter or shortening.  Avoid foods that cause symptoms. These foods may be different for everyone. Common foods that cause symptoms  include: ? Tomatoes. ? Oranges, lemons, and limes. ? Peppers. ? Spicy food. ? Onions and garlic. ? Vinegar. Lifestyle  Maintain a healthy weight. Ask your doctor what weight is healthy for you. If you need to lose weight, work with your doctor to do so safely.  Exercise for at least 30 minutes for 5 or more days each week, or as told by your doctor.  Wear loose-fitting clothes.  Do not smoke. If you need help quitting, ask your doctor.  Sleep with the head of your bed higher than your feet. Use a wedge under the mattress or blocks under the bed frame to raise the head of the bed. Summary  When you have gastroesophageal reflux disease (GERD), food and lifestyle choices are very important in easing your symptoms.  Eat small meals often instead of 3 large meals a day. Eat your meals slowly, and in a place where you are relaxed.  Limit high-fat foods such as fatty meat or fried foods.  Avoid bending over or lying down until 2-3 hours after eating.  Avoid peppermint and spearmint, caffeine, alcohol, and chocolate. This information is not intended to replace advice given to you by your health care provider. Make sure you discuss any questions you have with your health care provider. Document Released: 04/17/2012 Document Revised: 02/07/2019 Document Reviewed: 11/22/2016 Elsevier Patient Education  2020 [employer].

## 2019-09-30 ENCOUNTER — Encounter: Payer: Self-pay | Admitting: Physician Assistant

## 2019-10-01 ENCOUNTER — Ambulatory Visit (INDEPENDENT_AMBULATORY_CARE_PROVIDER_SITE_OTHER): Payer: BC Managed Care – PPO

## 2019-10-01 ENCOUNTER — Other Ambulatory Visit: Payer: Self-pay

## 2019-10-01 ENCOUNTER — Other Ambulatory Visit: Payer: BC Managed Care – PPO

## 2019-10-01 DIAGNOSIS — R61 Generalized hyperhidrosis: Secondary | ICD-10-CM | POA: Diagnosis not present

## 2019-10-09 ENCOUNTER — Other Ambulatory Visit: Payer: Self-pay | Admitting: Physician Assistant

## 2019-11-04 ENCOUNTER — Telehealth: Payer: Self-pay | Admitting: Physician Assistant

## 2019-11-04 ENCOUNTER — Ambulatory Visit: Payer: BC Managed Care – PPO | Admitting: Physician Assistant

## 2019-11-04 NOTE — Telephone Encounter (Signed)
Pt would like a refer you to GI for further assessment for reflux symptoms. He also states that his urine had a sweet smell to it and wanted to know if he should be concern about it. He starts that it has subsided but he wanted to make cody aware of this. Pt can be reached at the home #

## 2019-11-04 NOTE — Telephone Encounter (Signed)
He should be able to call GI and schedule appointment. We can replace a referral in case insurance requires it for the new year.   In regards to urinary symptoms, would need to have appt with me either in-office or can be virtual to discuss and then have him come in to leave urine sample.

## 2019-11-04 NOTE — Telephone Encounter (Signed)
Patient had colonoscopy in 12/2018 Dr Henrene Pastor. Please advise

## 2019-11-05 NOTE — Telephone Encounter (Signed)
Advised patient of PCP recommendations. He should be able to call Dr Henrene Pastor to schedule an appointment and he will call back to schedule a virtual visit with [facility] for the urinary symptoms

## 2019-11-15 DIAGNOSIS — I10 Essential (primary) hypertension: Secondary | ICD-10-CM | POA: Insufficient documentation

## 2019-11-15 DIAGNOSIS — Z6828 Body mass index (BMI) 28.0-28.9, adult: Secondary | ICD-10-CM | POA: Insufficient documentation

## 2019-11-21 ENCOUNTER — Other Ambulatory Visit: Payer: Self-pay | Admitting: Physician Assistant

## 2019-11-21 DIAGNOSIS — F5102 Adjustment insomnia: Secondary | ICD-10-CM

## 2019-11-21 NOTE — Telephone Encounter (Signed)
Xanax last rx 06/26/19 #20 1 RF LOV: 09/17/19 Night sweats/Gerd

## 2019-12-04 ENCOUNTER — Other Ambulatory Visit: Payer: Self-pay | Admitting: Physician Assistant

## 2019-12-04 DIAGNOSIS — E785 Hyperlipidemia, unspecified: Secondary | ICD-10-CM

## 2020-01-16 ENCOUNTER — Other Ambulatory Visit: Payer: Self-pay

## 2020-01-16 ENCOUNTER — Encounter: Payer: Self-pay | Admitting: Physician Assistant

## 2020-01-16 ENCOUNTER — Other Ambulatory Visit: Payer: Self-pay | Admitting: Physician Assistant

## 2020-01-16 ENCOUNTER — Telehealth (INDEPENDENT_AMBULATORY_CARE_PROVIDER_SITE_OTHER): Payer: BC Managed Care – PPO | Admitting: Physician Assistant

## 2020-01-16 DIAGNOSIS — F411 Generalized anxiety disorder: Secondary | ICD-10-CM

## 2020-01-16 DIAGNOSIS — F5102 Adjustment insomnia: Secondary | ICD-10-CM | POA: Diagnosis not present

## 2020-01-16 MED ORDER — BUPROPION HCL ER (XL) 150 MG PO TB24: 150.0000 mg | ORAL_TABLET | Freq: Every day | ORAL | 1 refills | Status: DC

## 2020-01-16 MED ORDER — ALPRAZOLAM 0.25 MG PO TABS: ORAL_TABLET | ORAL | 1 refills | Status: DC

## 2020-01-16 NOTE — Patient Instructions (Signed)
Instructions sent to MyChart  Please decrease your sertraline (Zoloft) back to 1 tablet daily with 600 mg). Start the Wellbutrin XL once daily as directed. Continue the Xanax as needed.  Follow-up with me via MyChart in a couple weeks to let me know how mood/anxiety are doing as well as sexual function.  I am hoping that this little change will make huge difference for you.  We will make further adjustments if needed at that time and schedule our next routine follow-up.

## 2020-01-16 NOTE — Progress Notes (Signed)
I have discussed the procedure for the virtual visit with the patient who has given consent to proceed with assessment and treatment.   Kataleia Quaranta S Jabir Dahlem, CMA     

## 2020-01-16 NOTE — Progress Notes (Signed)
Virtual Visit via Video   I connected with patient on 01/16/20 at 10:00 AM EDT by a video enabled telemedicine application and verified that I am speaking with the correct person using two identifiers.  Location patient: Home Location provider: [facility], Office Persons participating in the virtual visit: Patient, Provider, [place] (Patina Moore)  I discussed the limitations of evaluation and management by telemedicine and the availability of in person appointments. The patient expressed understanding and agreed to proceed.  Subjective:   HPI:   Patient presents via doxy.need today for follow-up of GAD.  Patient is currently on a regimen of sertraline 150 mg daily and Xanax 0.25 mg QHS PRN -- uses no more than 2 x week.  Endorses taking medications as directed.  Notes the sertraline is still working well for his overall anxiety.  Unfortunately has noted some decrease in libido and sexual dysfunction since increase in dose.  Does not want to switch off of sertraline if possible.  Is wondering what his options are to help with this side effect.  ROS:   See pertinent positives and negatives per HPI.  Patient Active Problem List   Diagnosis Date Noted  . Visit for preventive health examination 06/12/2018  . Prostate cancer screening 06/12/2018  . Obstructive sleep apnea 03/02/2017  . Palpitations 03/02/2017  . Restless leg syndrome 11/14/2016  . Generalized anxiety disorder 02/24/2015  . Hyperlipidemia LDL goal <130 02/24/2015  . Hypertriglyceridemia 02/24/2015    Social History   Tobacco Use  . Smoking status: Never Smoker  . Smokeless tobacco: Never Used  Substance Use Topics  . Alcohol use: Yes    Comment: occ beer    Current Outpatient Medications:  .  ALPRAZolam (XANAX) 0.25 MG tablet, 1 TAB AT BEDTIME AS NEEDED FOR SLEEP. MAY TAKE AN ADDITIONAL TAB IN THE DAY FOR SEVERE ACUTE ANXIETY, Disp: 20 tablet, Rfl: 1 .  atorvastatin (LIPITOR) 20 MG tablet, TAKE 1 TABLET  (20 MG TOTAL) BY MOUTH DAILY AT 6 PM., Disp: 90 tablet, Rfl: 1 .  celecoxib (CELEBREX) 200 MG capsule, Take 200 mg by mouth daily., Disp: , Rfl:  .  gabapentin (NEURONTIN) 300 MG capsule, TAKE 1 CAPSULE BY MOUTH EVERYDAY AT BEDTIME, Disp: 90 capsule, Rfl: 1 .  metoprolol succinate (TOPROL-XL) 25 MG 24 hr tablet, Take 0.5 tablets (12.5 mg total) by mouth daily., Disp: 45 tablet, Rfl: 3 .  Multiple Vitamin (MULTIVITAMIN) tablet, Take 1 tablet by mouth daily., Disp: , Rfl:  .  pantoprazole (PROTONIX) 40 MG tablet, TAKE 1 TABLET BY MOUTH EVERY DAY, Disp: 90 tablet, Rfl: 1 .  sertraline (ZOLOFT) 100 MG tablet, TAKE 1 AND 1/2 TABLETS BY MOUTH EVERY DAY, Disp: 135 tablet, Rfl: 1  Allergies  Allergen Reactions  . Augmentin [Amoxicillin-Pot Clavulanate] Diarrhea    Objective:   There were no vitals taken for this visit.  Patient is well-developed, well-nourished in no acute distress.  Resting comfortably at home.  Head is normocephalic, atraumatic.  No labored breathing.  Speech is clear and coherent with logical content.  Patient is alert and oriented at baseline.   Assessment and Plan:   1. Generalized anxiety disorder We will add on Wellbutrin to augment his medication to reach better therapeutic diet and to mitigate any sexual side effects.  Rx Wellbutrin XL 150 mg daily.  We will have him decrease sertraline to 100 mg daily.  He is to follow-up via MyChart in 2 weeks to let us know how he is adjusting to  the change.  We will make further adjustments at that time and determine next in office/video follow-up.    Leeanne Rio, PA-C 01/16/2020

## 2020-01-29 ENCOUNTER — Ambulatory Visit: Payer: BC Managed Care – PPO | Admitting: Pulmonary Disease

## 2020-01-30 ENCOUNTER — Ambulatory Visit: Payer: BC Managed Care – PPO | Admitting: Adult Health

## 2020-02-03 NOTE — Progress Notes (Signed)
Virtual Visit via Telephone Note  I connected with [name] on 02/04/20 at  9:00 AM EDT by telephone and verified that I am speaking with the correct person using two identifiers.  Location: Patient: Home Provider: [facility] - [id number], [place], [place], [place] [address]   I discussed the limitations, risks, security and privacy concerns of performing an evaluation and management service by telephone and the availability of in person appointments. I also discussed with the patient that there may be a patient responsible charge related to this service. The patient expressed understanding and agreed to proceed.  Virtual visit being conducted today in the setting of the global pandemic of COVID-19.  Patient consented to consult via telephone: Yes People present and their role in pt care: Pt   History of Present Illness:  54 year old male never smoker followed in our office for obstructive sleep apnea  Past medical history: Anxiety, hyperlipidemia, restless leg, hypertriglyceridemia Smoking history: Never smoker Maintenance: Patient of Dr. Annamaria Boots  Chief complaint: CPAP   54 year old male never smoker followed in our office for mild obstructive sleep apnea.  Patient completing follow-up with our office today.  Patient CPAP compliance report shows adequate compliance.  See compliance report listed below:  01/04/2020-02/02/2020-CPAP compliance report-28 out of last 30 days used, 14 of those days greater than 4 hours, average usage 4 hours and 6 minutes, APAP setting 5-20, 95th percentile 8.1, AHI 4.1 Leaks are fairly often based off of compliance report  Patient reporting that he is sleeping okay.  His sleep quality has decreased since having neck surgery in October/2020.  He is also had more night sweats during that time.  He has no other symptoms such as fever, cough, sputum production.  He has a recent x-ray from December/2020 that was clear.  Patient reports that  still status post having neck surgery he struggling with wearing the mask tightly to his face.  Specifically on the left side he is unable to get the strap tightened down due to the pressure that it applies to his neck.  He is currently using a nasal mask.  He has not been tolerant of nasal pillows or a full facemask in the past.  Patient recently saw Dr. Benson Norway with gastroenterology due to persistent and worsening acid reflux despite PPI use.  They are planning to do an endoscopic.  This is not been scheduled yet.    Observations/Objective:  03/29/2017-home sleep study-AHI 7.5, SaO2 low 81%  Social History   Tobacco Use  Smoking Status Never Smoker  Smokeless Tobacco Never Used   Immunization History  Administered Date(s) Administered  . Influenza Inj Mdck Quad Pf 09/28/2018, 08/02/2019  . Influenza,inj,Quad PF,6+ Mos 08/01/2014, 07/26/2017, 07/25/2018, 08/02/2019  . Influenza,inj,quad, With Preservative 08/04/2016  . Tdap 11/05/2015   Assessment and Plan:  GERD (gastroesophageal reflux disease) Plan: Continue Protonix Review information on AVS today regarding acid reflux Make lifestyle changes discussed in office visit today Continue to work with gastroenterology Dr. Benson Norway  Obstructive sleep apnea Believe patient's issues with his sleep are likely multifactorial and directly related to his neck surgery.  Patient is in tolerant of being able to have the mass tight to the patient's face.  This is likely why were having leaks.  Patient is also probably irritated while he is sleeping causing him to move around more as well as have increased night sweats.  We will try to decrease the patient's pressure range on his CPAP today.  If  patient's problems persist we may need to consider an in lab CPAP titration study to both achieve a mask fitting as well as pressure evaluation.  We will bring patient back closely in 2 to 3 months with Dr. Annamaria Boots for further evaluation.  Plan: We will change  pressure setting to APAP 5-15 May need to consider CPAP titration in the future Close follow-up with our office in 2 to 3 months with Dr. Annamaria Boots  Follow Up Instructions:  Return in about 2 months (around 04/05/2020), or if symptoms worsen or fail to improve, for Follow up with Dr. Annamaria Boots.   I discussed the assessment and treatment plan with the patient. The patient was provided an opportunity to ask questions and all were answered. The patient agreed with the plan and demonstrated an understanding of the instructions.   The patient was advised to call back or seek an in-person evaluation if the symptoms worsen or if the condition fails to improve as anticipated.  I provided 24 minutes of non-face-to-face time during this encounter.   [name], NP

## 2020-02-04 ENCOUNTER — Ambulatory Visit: Payer: BC Managed Care – PPO | Admitting: Pulmonary Disease

## 2020-02-04 ENCOUNTER — Encounter: Payer: Self-pay | Admitting: Pulmonary Disease

## 2020-02-04 ENCOUNTER — Other Ambulatory Visit: Payer: Self-pay

## 2020-02-04 ENCOUNTER — Ambulatory Visit (INDEPENDENT_AMBULATORY_CARE_PROVIDER_SITE_OTHER): Payer: BC Managed Care – PPO | Admitting: Pulmonary Disease

## 2020-02-04 DIAGNOSIS — K219 Gastro-esophageal reflux disease without esophagitis: Secondary | ICD-10-CM | POA: Diagnosis not present

## 2020-02-04 DIAGNOSIS — G4733 Obstructive sleep apnea (adult) (pediatric): Secondary | ICD-10-CM

## 2020-02-04 NOTE — Patient Instructions (Addendum)
You were seen today by [name], NP  for:   Nice talking with you today over the phone.  I hope that we can start to improve your sleep quality.  Continue to work with gastroenterology.  I believe the issues that you are having at night are both related to the previous neck surgery, your current acid reflux as well as the inability to get a good mask fit due to your neck surgery.  We will have to continue to monitor this.  We will have close follow-up with you in our office in 2 to 3 months with Dr. Annamaria Boots.  I will also decrease your pressure settings today from 5-22 5-15.  If your symptoms continue to persist please feel free to give [place] a call.  We may need to consider an in lab CPAP titration study.  Please review the information below.  Take care and stay safe,  Halie Gass  1. Obstructive sleep apnea  Change pressure settings to APAP 5-15  We recommend that you continue using your CPAP daily >>>Keep up the hard work using your device >>> Goal should be wearing this for the entire night that you are sleeping, at least 4 to 6 hours  Remember:  . Do not drive or operate heavy machinery if tired or drowsy.  . Please notify the supply company and office if you are unable to use your device regularly due to missing supplies or machine being broken.  . Work on maintaining a healthy weight and following your recommended nutrition plan  . Maintain proper daily exercise and movement  . Maintaining proper use of your device can also help improve management of other chronic illnesses such as: Blood pressure, blood sugars, and weight management.   BiPAP/ CPAP Cleaning:  >>>Clean weekly, with Dawn soap, and bottle brush.  Set up to air dry. >>> Wipe mask out daily with wet wipe or towelette    2. Gastroesophageal reflux disease, unspecified whether esophagitis present  Protonix 40 mg tablet  >>>Please take 1 tablet daily 15 minutes to 30 minutes before your first meal of the day as well as before  your other medications >>>Try to take at the same time each day >>>take this medication daily  GERD management: >>>Avoid laying flat until 2 hours after meals >>>Elevate head of the bed including entire chest >>>Reduce size of meals and amount of fat, acid, spices, caffeine and sweets >>>If you are smoking, Please stop! >>>Decrease alcohol consumption >>>Work on maintaining a healthy weight with normal BMI   Follow Up:    No follow-ups on file.   Please do your part to reduce the spread of COVID-19:      Reduce your risk of any infection  and COVID19 by using the similar precautions used for avoiding the common cold or flu:  Marland Kitchen Wash your hands often with soap and warm water for at least 20 seconds.  If soap and water are not readily available, use an alcohol-based hand sanitizer with at least 60% alcohol.  . If coughing or sneezing, cover your mouth and nose by coughing or sneezing into the elbow areas of your shirt or coat, into a tissue or into your sleeve (not your hands). Langley Gauss A MASK when in public  . Avoid shaking hands with others and consider head nods or verbal greetings only. . Avoid touching your eyes, nose, or mouth with unwashed hands.  . Avoid close contact with people who are sick. . Avoid places or events  with large numbers of people in one location, like concerts or sporting events. . If you have some symptoms but not all symptoms, continue to monitor at home and seek medical attention if your symptoms worsen. . If you are having a medical emergency, call 911.   [place] / e-Visit: [email or web address]         [facility]: [facility]: [id number]                   [facility]: [id number]     It is flu season:   >>> Best ways to protect herself from the flu: Receive the yearly flu vaccine, practice  good hand hygiene washing with soap and also using hand sanitizer when available, eat a nutritious meals, get adequate rest, hydrate appropriately   Please contact the office if your symptoms worsen or you have concerns that you are not improving.   Thank you for choosing [facility] for your healthcare, and for allowing [place] to partner with you on your healthcare journey. I am thankful to be able to provide care to you today.   Wyn Quaker FNP-C     Gastroesophageal Reflux Disease, Adult Gastroesophageal reflux (GER) happens when acid from the stomach flows up into the tube that connects the mouth and the stomach (esophagus). Normally, food travels down the esophagus and stays in the stomach to be digested. With GER, food and stomach acid sometimes move back up into the esophagus. You may have a disease called gastroesophageal reflux disease (GERD) if the reflux:  Happens often.  Causes frequent or very bad symptoms.  Causes problems such as damage to the esophagus. When this happens, the esophagus becomes sore and swollen (inflamed). Over time, GERD can make small holes (ulcers) in the lining of the esophagus. What are the causes? This condition is caused by a problem with the muscle between the esophagus and the stomach. When this muscle is weak or not normal, it does not close properly to keep food and acid from coming back up from the stomach. The muscle can be weak because of:  Tobacco use.  Pregnancy.  Having a certain type of hernia (hiatal hernia).  Alcohol use.  Certain foods and drinks, such as coffee, chocolate, onions, and peppermint. What increases the risk? You are more likely to develop this condition if you:  Are overweight.  Have a disease that affects your connective tissue.  Use NSAID medicines. What are the signs or symptoms? Symptoms of this condition include:  Heartburn.  Difficult or painful swallowing.  The feeling of having a lump in  the throat.  A bitter taste in the mouth.  Bad breath.  Having a lot of saliva.  Having an upset or bloated stomach.  Belching.  Chest pain. Different conditions can cause chest pain. Make sure you see your doctor if you have chest pain.  Shortness of breath or noisy breathing (wheezing).  Ongoing (chronic) cough or a cough at night.  Wearing away of the surface of teeth (tooth enamel).  Weight loss. How is this treated? Treatment will depend on how bad your symptoms are. Your doctor may suggest:  Changes to your diet.  Medicine.  Surgery. Follow these instructions at home: Eating and drinking   Follow a diet as told by your doctor. You may need to avoid foods and drinks such as: ? Coffee and tea (with or without caffeine). ?  Drinks that contain alcohol. ? Energy drinks and sports drinks. ? Bubbly (carbonated) drinks or sodas. ? Chocolate and cocoa. ? Peppermint and mint flavorings. ? Garlic and onions. ? Horseradish. ? Spicy and acidic foods. These include peppers, chili powder, curry powder, vinegar, hot sauces, and BBQ sauce. ? Citrus fruit juices and citrus fruits, such as oranges, lemons, and limes. ? Tomato-based foods. These include red sauce, chili, salsa, and pizza with red sauce. ? Fried and fatty foods. These include donuts, french fries, potato chips, and high-fat dressings. ? High-fat meats. These include hot dogs, rib eye steak, sausage, ham, and bacon. ? High-fat dairy items, such as whole milk, butter, and cream cheese.  Eat small meals often. Avoid eating large meals.  Avoid drinking large amounts of liquid with your meals.  Avoid eating meals during the 2-3 hours before bedtime.  Avoid lying down right after you eat.  Do not exercise right after you eat. Lifestyle   Do not use any products that contain nicotine or tobacco. These include cigarettes, e-cigarettes, and chewing tobacco. If you need help quitting, ask your doctor.  Try to  lower your stress. If you need help doing this, ask your doctor.  If you are overweight, lose an amount of weight that is healthy for you. Ask your doctor about a safe weight loss goal. General instructions  Pay attention to any changes in your symptoms.  Take over-the-counter and prescription medicines only as told by your doctor. Do not take aspirin, ibuprofen, or other NSAIDs unless your doctor says it is okay.  Wear loose clothes. Do not wear anything tight around your waist.  Raise (elevate) the head of your bed about 6 inches (15 cm).  Avoid bending over if this makes your symptoms worse.  Keep all follow-up visits as told by your doctor. This is important. Contact a doctor if:  You have new symptoms.  You lose weight and you do not know why.  You have trouble swallowing or it hurts to swallow.  You have wheezing or a cough that keeps happening.  Your symptoms do not get better with treatment.  You have a hoarse voice. Get help right away if:  You have pain in your arms, neck, jaw, teeth, or back.  You feel sweaty, dizzy, or light-headed.  You have chest pain or shortness of breath.  You throw up (vomit) and your throw-up looks like blood or coffee grounds.  You pass out (faint).  Your poop (stool) is bloody or black.  You cannot swallow, drink, or eat. Summary  If a person has gastroesophageal reflux disease (GERD), food and stomach acid move back up into the esophagus and cause symptoms or problems such as damage to the esophagus.  Treatment will depend on how bad your symptoms are.  Follow a diet as told by your doctor.  Take all medicines only as told by your doctor. This information is not intended to replace advice given to you by your health care provider. Make sure you discuss any questions you have with your health care provider. Document Revised: 04/25/2018 Document Reviewed: 04/25/2018 Elsevier Patient Education  2020 [place] for Gastroesophageal Reflux Disease, Adult When you have gastroesophageal reflux disease (GERD), the foods you eat and your eating habits are very important. Choosing the right foods can help ease your discomfort. Think about working with a nutrition specialist (dietitian) to help you make good choices. What are tips for following this plan?  Meals  Choose healthy foods that are low in fat, such as fruits, vegetables, whole grains, low-fat dairy products, and lean meat, fish, and poultry.  Eat small meals often instead of 3 large meals a day. Eat your meals slowly, and in a place where you are relaxed. Avoid bending over or lying down until 2-3 hours after eating.  Avoid eating meals 2-3 hours before bed.  Avoid drinking a lot of liquid with meals.  Cook foods using methods other than frying. Bake, grill, or broil food instead.  Avoid or limit: ? Chocolate. ? Peppermint or spearmint. ? Alcohol. ? Pepper. ? Black and decaffeinated coffee. ? Black and decaffeinated tea. ? Bubbly (carbonated) soft drinks. ? Caffeinated energy drinks and soft drinks.  Limit high-fat foods such as: ? Fatty meat or fried foods. ? Whole milk, cream, butter, or ice cream. ? Nuts and nut butters. ? Pastries, donuts, and sweets made with butter or shortening.  Avoid foods that cause symptoms. These foods may be different for everyone. Common foods that cause symptoms include: ? Tomatoes. ? Oranges, lemons, and limes. ? Peppers. ? Spicy food. ? Onions and garlic. ? Vinegar. Lifestyle  Maintain a healthy weight. Ask your doctor what weight is healthy for you. If you need to lose weight, work with your doctor to do so safely.  Exercise for at least 30 minutes for 5 or more days each week, or as told by your doctor.  Wear loose-fitting clothes.  Do not smoke. If you need help quitting, ask your doctor.  Sleep with the head of your bed higher than your feet. Use a wedge under the mattress or  blocks under the bed frame to raise the head of the bed. Summary  When you have gastroesophageal reflux disease (GERD), food and lifestyle choices are very important in easing your symptoms.  Eat small meals often instead of 3 large meals a day. Eat your meals slowly, and in a place where you are relaxed.  Limit high-fat foods such as fatty meat or fried foods.  Avoid bending over or lying down until 2-3 hours after eating.  Avoid peppermint and spearmint, caffeine, alcohol, and chocolate. This information is not intended to replace advice given to you by your health care provider. Make sure you discuss any questions you have with your health care provider. Document Revised: 02/07/2019 Document Reviewed: 11/22/2016    Living With Sleep Apnea Sleep apnea is a condition in which breathing pauses or becomes shallow during sleep. Sleep apnea is most commonly caused by a collapsed or blocked airway. People with sleep apnea snore loudly and have times when they gasp and stop breathing for 10 seconds or more during sleep. This happens over and over during the night. This disrupts your sleep and keeps your body from getting the rest that it needs, which can cause tiredness and lack of energy (fatigue) during the day. The breaks in breathing also interrupt the deep sleep that you need to feel rested. Even if you do not completely wake up from the gaps in breathing, your sleep may not be restful. You may also have a headache in the morning and low energy during the day, and you may feel anxious or depressed. How can sleep apnea affect me? Sleep apnea increases your chances of extreme tiredness during the day (daytime fatigue). It can also increase your risk for health conditions, such as:  Heart attack.  Stroke.  Diabetes.  Heart failure.  Irregular heartbeat.  High blood pressure. If you  have daytime fatigue as a result of sleep apnea, you may be more likely to:  Perform poorly at school  or work.  Fall asleep while driving.  Have difficulty with attention.  Develop depression or anxiety.  Become severely overweight (obese).  Have sexual dysfunction. What actions can I take to manage sleep apnea? Sleep apnea treatment   If you were given a device to open your airway while you sleep, use it only as told by your health care provider. You may be given: ? An oral appliance. This is a custom-made mouthpiece that shifts your lower jaw forward. ? A continuous positive airway pressure (CPAP) device. This device blows air through a mask when you breathe out (exhale). ? A nasal expiratory positive airway pressure (EPAP) device. This device has valves that you put into each nostril. ? A bi-level positive airway pressure (BPAP) device. This device blows air through a mask when you breathe in (inhale) and breathe out (exhale).  You may need surgery if other treatments do not work for you. Sleep habits  Go to sleep and wake up at the same time every day. This helps set your internal clock (circadian rhythm) for sleeping. ? If you stay up later than usual, such as on weekends, try to get up in the morning within 2 hours of your normal wake time.  Try to get at least 7-9 hours of sleep each night.  Stop computer, tablet, and mobile phone use a few hours before bedtime.  Do not take long naps during the day. If you nap, limit it to 30 minutes.  Have a relaxing bedtime routine. Reading or listening to music may relax you and help you sleep.  Use your bedroom only for sleep. ? Keep your television and computer out of your bedroom. ? Keep your bedroom cool, dark, and quiet. ? Use a supportive mattress and pillows.  Follow your health care provider's instructions for other changes to sleep habits. Nutrition  Do not eat heavy meals in the evening.  Do not have caffeine in the later part of the day. The effects of caffeine can last for more than 5 hours.  Follow your health  care provider's or dietitian's instructions for any diet changes. Lifestyle      Do not drink alcohol before bedtime. Alcohol can cause you to fall asleep at first, but then it can cause you to wake up in the middle of the night and have trouble getting back to sleep.  Do not use any products that contain nicotine or tobacco, such as cigarettes and e-cigarettes. If you need help quitting, ask your health care provider. Medicines  Take over-the-counter and prescription medicines only as told by your health care provider.  Do not use over-the-counter sleep medicine. You can become dependent on this medicine, and it can make sleep apnea worse.  Do not use medicines, such as sedatives and narcotics, unless told by your health care provider. Activity  Exercise on most days, but avoid exercising in the evening. Exercising near bedtime can interfere with sleeping.  If possible, spend time outside every day. Natural light helps regulate your circadian rhythm. General information  Lose weight if you need to, and maintain a healthy weight.  Keep all follow-up visits as told by your health care provider. This is important.  If you are having surgery, make sure to tell your health care provider that you have sleep apnea. You may need to bring your device with you. Where to find more  information Learn more about sleep apnea and daytime fatigue from:  American Sleep Association: sleepassociation.[place]: sleepfoundation.org  National Heart, Lung, and Blood Institute: https://www.[place].biz/ Summary  Sleep apnea can cause daytime fatigue and other serious health conditions.  Both sleep apnea and daytime fatigue can be bad for your health and well-being.  You may need to wear a device while sleeping to help keep your airway open.  If you are having surgery, make sure to tell your health care provider that you have sleep apnea. You may need to bring your device with  you.  Making changes to sleep habits, diet, lifestyle, and activity can help you manage sleep apnea. This information is not intended to replace advice given to you by your health care provider. Make sure you discuss any questions you have with your health care provider. Document Revised: 02/08/2019 Document Reviewed: 01/11/2018 Elsevier Patient Education  [employer] Patient Education  [employer].

## 2020-02-04 NOTE — Assessment & Plan Note (Signed)
Believe patient's issues with his sleep are likely multifactorial and directly related to his neck surgery.  Patient is in tolerant of being able to have the mass tight to the patient's face.  This is likely why were having leaks.  Patient is also probably irritated while he is sleeping causing him to move around more as well as have increased night sweats.  We will try to decrease the patient's pressure range on his CPAP today.  If patient's problems persist we may need to consider an in lab CPAP titration study to both achieve a mask fitting as well as pressure evaluation.  We will bring patient back closely in 2 to 3 months with Dr. Annamaria Boots for further evaluation.  Plan: We will change pressure setting to APAP 5-15 May need to consider CPAP titration in the future Close follow-up with our office in 2 to 3 months with Dr. Annamaria Boots

## 2020-02-04 NOTE — Assessment & Plan Note (Signed)
Plan: Continue Protonix Review information on AVS today regarding acid reflux Make lifestyle changes discussed in office visit today Continue to work with gastroenterology Dr. Benson Norway

## 2020-02-06 ENCOUNTER — Other Ambulatory Visit: Payer: Self-pay

## 2020-02-06 DIAGNOSIS — G4733 Obstructive sleep apnea (adult) (pediatric): Secondary | ICD-10-CM

## 2020-02-07 ENCOUNTER — Other Ambulatory Visit: Payer: Self-pay | Admitting: Physician Assistant

## 2020-02-16 ENCOUNTER — Other Ambulatory Visit: Payer: Self-pay | Admitting: Physician Assistant

## 2020-02-16 DIAGNOSIS — M25562 Pain in left knee: Secondary | ICD-10-CM

## 2020-02-16 DIAGNOSIS — G8929 Other chronic pain: Secondary | ICD-10-CM

## 2020-03-27 ENCOUNTER — Encounter: Payer: Self-pay | Admitting: Family Medicine

## 2020-03-27 ENCOUNTER — Other Ambulatory Visit: Payer: Self-pay

## 2020-03-27 ENCOUNTER — Telehealth (INDEPENDENT_AMBULATORY_CARE_PROVIDER_SITE_OTHER): Payer: BC Managed Care – PPO | Admitting: Family Medicine

## 2020-03-27 DIAGNOSIS — G2581 Restless legs syndrome: Secondary | ICD-10-CM | POA: Diagnosis not present

## 2020-03-27 MED ORDER — ROPINIROLE HCL 0.5 MG PO TABS: ORAL_TABLET | ORAL | 1 refills | Status: DC

## 2020-03-27 NOTE — Progress Notes (Signed)
I have discussed the procedure for the virtual visit with the patient who has given consent to proceed with assessment and treatment.   Pt unable to obtain vitals.   Monique Gift L Deamber [place]halter, CMA     

## 2020-03-27 NOTE — Progress Notes (Signed)
Virtual Visit via Video   I connected with patient on 03/27/20 at  1:00 PM EDT by a video enabled telemedicine application and verified that I am speaking with the correct person using two identifiers.  Location patient: Home Location provider: Acupuncturist, Office Persons participating in the virtual visit: Patient, Provider, [place] (Jess B)  I discussed the limitations of evaluation and management by telemedicine and the availability of in person appointments. The patient expressed understanding and agreed to proceed.  Subjective:   HPI:   RLS- Pt reports that for the last few nights wife has had to sleep on couch b/c 'I keep shaking my legs'.  Pt reports he takes Gabapentin 300mg  nightly.  Pt reports taking medication regularly.  Feels his legs to some extent but not to what wife is describing.  Pt reports he took 2 tabs last night w/o relief.  Pt has not been on other RLS medications previously.  ROS:   See pertinent positives and negatives per HPI.  Patient Active Problem List   Diagnosis Date Noted  . GERD (gastroesophageal reflux disease) 02/04/2020  . Visit for preventive health examination 06/12/2018  . Prostate cancer screening 06/12/2018  . Obstructive sleep apnea 03/02/2017  . Palpitations 03/02/2017  . Restless leg syndrome 11/14/2016  . Generalized anxiety disorder 02/24/2015  . Hyperlipidemia LDL goal <130 02/24/2015  . Hypertriglyceridemia 02/24/2015    Social History   Tobacco Use  . Smoking status: Never Smoker  . Smokeless tobacco: Never Used  Substance Use Topics  . Alcohol use: Yes    Comment: occ beer    Current Outpatient Medications:  .  ALPRAZolam (XANAX) 0.25 MG tablet, 1 TAB AT BEDTIME AS NEEDED FOR SLEEP. MAY TAKE AN ADDITIONAL TAB IN THE DAY FOR SEVERE ACUTE ANXIETY, Disp: 20 tablet, Rfl: 1 .  atorvastatin (LIPITOR) 20 MG tablet, TAKE 1 TABLET (20 MG TOTAL) BY MOUTH DAILY AT 6 PM., Disp: 90 tablet, Rfl: 1 .  buPROPion (WELLBUTRIN XL)  150 MG 24 hr tablet, TAKE 1 TABLET BY MOUTH EVERY DAY, Disp: 90 tablet, Rfl: 1 .  celecoxib (CELEBREX) 200 MG capsule, Take 200 mg by mouth daily., Disp: , Rfl:  .  gabapentin (NEURONTIN) 300 MG capsule, TAKE 1 CAPSULE BY MOUTH EVERYDAY AT BEDTIME, Disp: 90 capsule, Rfl: 1 .  metoprolol succinate (TOPROL-XL) 25 MG 24 hr tablet, Take 0.5 tablets (12.5 mg total) by mouth daily., Disp: 45 tablet, Rfl: 3 .  Multiple Vitamin (MULTIVITAMIN) tablet, Take 1 tablet by mouth daily., Disp: , Rfl:  .  pantoprazole (PROTONIX) 40 MG tablet, TAKE 1 TABLET BY MOUTH EVERY DAY, Disp: 90 tablet, Rfl: 1 .  sertraline (ZOLOFT) 100 MG tablet, TAKE 1 AND 1/2 TABLETS BY MOUTH EVERY DAY, Disp: 135 tablet, Rfl: 1  Allergies  Allergen Reactions  . Augmentin [Amoxicillin-Pot Clavulanate] Diarrhea    Objective:   There were no vitals taken for this visit. AAOx3, NAD NCAT, EOMI No obvious CN deficits Coloring WNL Pt is able to speak clearly, coherently without shortness of breath or increased work of breathing.  Thought process is linear.  Mood is appropriate.   Assessment and Plan:   RLS- deteriorated.  Pt is not having symptomatic relief w/ the gabapentin- even taking 2 last night (600mg ).  Since this is the only medication he has been on, will switch to Requip in hopes of improving his symptoms.  Told him that he will likely need to titrate this so f/u is needed.  Pt expressed understanding  and is in agreement w/ plan.    Annye Asa, MD 03/27/2020

## 2020-04-06 ENCOUNTER — Other Ambulatory Visit: Payer: Self-pay | Admitting: Physician Assistant

## 2020-04-09 ENCOUNTER — Other Ambulatory Visit: Payer: Self-pay | Admitting: Internal Medicine

## 2020-04-09 DIAGNOSIS — R002 Palpitations: Secondary | ICD-10-CM

## 2020-04-18 ENCOUNTER — Other Ambulatory Visit: Payer: Self-pay | Admitting: Family Medicine

## 2020-04-22 ENCOUNTER — Encounter: Payer: Self-pay | Admitting: Physician Assistant

## 2020-04-22 ENCOUNTER — Ambulatory Visit: Payer: BC Managed Care – PPO | Admitting: Physician Assistant

## 2020-04-22 ENCOUNTER — Other Ambulatory Visit: Payer: Self-pay

## 2020-04-22 VITALS — BP 124/80 | HR 64 | Temp 98.0°F | Resp 18 | Ht 74.0 in | Wt 229.0 lb

## 2020-04-22 DIAGNOSIS — G2581 Restless legs syndrome: Secondary | ICD-10-CM | POA: Diagnosis not present

## 2020-04-22 DIAGNOSIS — Z125 Encounter for screening for malignant neoplasm of prostate: Secondary | ICD-10-CM | POA: Diagnosis not present

## 2020-04-22 DIAGNOSIS — E785 Hyperlipidemia, unspecified: Secondary | ICD-10-CM | POA: Diagnosis not present

## 2020-04-22 LAB — LIPID PANEL
Cholesterol: 167 mg/dL (ref 0–200)
HDL: 52.1 mg/dL (ref >39.00)
LDL Cholesterol: 75 mg/dL (ref 0–99)
NonHDL: 115.06
Total CHOL/HDL Ratio: 3
Triglycerides: 198 mg/dL — ABNORMAL HIGH (ref 0.0–149.0)
VLDL: 39.6 mg/dL (ref 0.0–40.0)

## 2020-04-22 LAB — PSA: PSA: 0.77 ng/mL (ref 0.10–4.00)

## 2020-04-22 LAB — IRON: Iron: 99 ug/dL (ref 42–165)

## 2020-04-22 NOTE — Progress Notes (Signed)
Patient presents to clinic today for follow-up of restless leg syndrome.  Patient would also like to update cholesterol panel and prostate cancer screening as these are overdue.  In regards to RLS patient was started on a regimen of Requip 1 mg nightly.  Patient endorses taking medication as directed and tolerating very well.  Has noted substantial improvement in his symptoms.  He is able to rest well throughout the night.  Denies any noted side effect of medication.  Patient with history of hyperlipidemia, currently on a regimen of atorvastatin 20 mg daily.  Notes he has made substantial changes to his diet and exercise regimen.  Notes he initially lost some weight, putting a little bit back on as muscle since making these changes.  Patient also due for prostate cancer screen.  Average risk.  Denies any urinary hesitancy, incomplete bladder emptying, nocturia or post void dribbling.  Past Medical History:  Diagnosis Date  . Allergy   . Anxiety and depression   . Heart palpitations   . Hyperlipidemia   . Prostate infection   . Sleep apnea    CPAP     Current Outpatient Medications on File Prior to Visit  Medication Sig Dispense Refill  . ALPRAZolam (XANAX) 0.25 MG tablet 1 TAB AT BEDTIME AS NEEDED FOR SLEEP. MAY TAKE AN ADDITIONAL TAB IN THE DAY FOR SEVERE ACUTE ANXIETY 20 tablet 1  . atorvastatin (LIPITOR) 20 MG tablet TAKE 1 TABLET (20 MG TOTAL) BY MOUTH DAILY AT 6 PM. 90 tablet 1  . buPROPion (WELLBUTRIN XL) 150 MG 24 hr tablet TAKE 1 TABLET BY MOUTH EVERY DAY 90 tablet 1  . celecoxib (CELEBREX) 200 MG capsule Take 200 mg by mouth daily.    . metoprolol succinate (TOPROL-XL) 25 MG 24 hr tablet TAKE 1/2 TABLET BY MOUTH EVERY DAY 15 tablet 0  . Multiple Vitamin (MULTIVITAMIN) tablet Take 1 tablet by mouth daily.    . pantoprazole (PROTONIX) 40 MG tablet TAKE 1 TABLET BY MOUTH EVERY DAY 90 tablet 1  . rOPINIRole (REQUIP) 0.5 MG tablet TAKE 1 TAB NIGHTLY AT BEDTIME X1 WEEK AND THEN  INCREASE TO 2 TABS (1MG ) NIGHTLY 60 tablet 1  . sertraline (ZOLOFT) 100 MG tablet TAKE 1.5 TABLETS BY MOUTH EVERY DAY 135 tablet 1  . gabapentin (NEURONTIN) 300 MG capsule TAKE 1 CAPSULE BY MOUTH EVERYDAY AT BEDTIME (Patient not taking: Reported on 04/22/2020) 90 capsule 1   No current facility-administered medications on file prior to visit.    Allergies  Allergen Reactions  . Augmentin [Amoxicillin-Pot Clavulanate] Diarrhea    Family History  Problem Relation Age of Onset  . Hypertension Mother   . Hypertension Father   . Heart disease Father        CHF   . Hypertension Sister   . Colon polyps Brother   . Colon cancer Neg Hx   . Esophageal cancer Neg Hx   . Rectal cancer Neg Hx   . Stomach cancer Neg Hx     Social History   Socioeconomic History  . Marital status: Married    Spouse name: Not on file  . Number of children: 4  . Years of education: Not on file  . Highest education level: Not on file  Occupational History  . Occupation: Pharmacist, hospital  Tobacco Use  . Smoking status: Never Smoker  . Smokeless tobacco: Never Used  Vaping Use  . Vaping Use: Never used  Substance and Sexual Activity  . Alcohol use: Yes  Comment: occ beer  . Drug use: No  . Sexual activity: Yes    Partners: Female  Other Topics Concern  . 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   Social Determinants of Health   Financial Resource Strain:   . Difficulty of Paying Living Expenses:   Food Insecurity:   . Worried About Charity fundraiser in the Last Year:   . Arboriculturist in the Last Year:   Transportation Needs:   . Film/video editor (Medical):   Marland Kitchen Lack of Transportation (Non-Medical):   Physical Activity:   . Days of Exercise per Week:   . Minutes of Exercise per Session:   Stress:   . Feeling of Stress :   Social Connections:   . Frequency of  Communication with Friends and Family:   . Frequency of Social Gatherings with Friends and Family:   . Attends Religious Services:   . Active Member of Clubs or Organizations:   . Attends Archivist Meetings:   Marland Kitchen Marital Status:    Review of Systems - See HPI.  All other ROS are negative.  Resp 18   Ht 6\' 2"  (1.88 m)   Wt 229 lb (103.9 kg)   BMI 29.40 kg/m   Physical Exam Vitals reviewed.  Constitutional:      Appearance: Normal appearance.  HENT:     Head: Normocephalic and atraumatic.  Eyes:     Conjunctiva/sclera: Conjunctivae normal.  Cardiovascular:     Rate and Rhythm: Normal rate and regular rhythm.     Heart sounds: Normal heart sounds.  Pulmonary:     Breath sounds: Normal breath sounds.  Musculoskeletal:     Cervical back: Neck supple.  Neurological:     General: No focal deficit present.     Mental Status: He is alert and oriented to person, place, and time.  Psychiatric:        Mood and Affect: Mood normal.    Assessment/Plan: 1. Hyperlipidemia LDL goal <130 We will recheck lipid levels today.  Will alter regimen accordingly.  For now continue atorvastatin 20 mg daily.  Continue dietary and exercise changes. - Lipid panel  2. Restless leg syndrome We will check iron level today just to make sure within normal range as low iron can cause RLS symptoms.  We will have him continue Requip at current dosing. - Iron  3. Prostate cancer screening Average risk.  Asymptomatic.  No indication for digital rectal exam today.  He indicates understanding of the limitations of this screening test and wishes to proceed with screening PSA testing.  - PSA  This visit occurred during the SARS-CoV-2 public health emergency.  Safety protocols were in place, including screening questions prior to the visit, additional usage of staff PPE, and extensive cleaning of exam room while observing appropriate contact time as indicated for disinfecting solutions.      Leeanne Rio, PA-C

## 2020-04-22 NOTE — Patient Instructions (Signed)
Please go to the lab today for blood work.  I will call you with your results. We will alter treatment regimen(s) if indicated by your results.   Please continue current medication regimen. I am glad you are doing so well. With the diet, exercise changes and weight loss, maybe we can start reducing doses and getting you off of some of these medications.  Take care!

## 2020-04-25 ENCOUNTER — Encounter: Payer: Self-pay | Admitting: Physician Assistant

## 2020-04-26 NOTE — Progress Notes (Signed)
@Patient  ID: Francisco Pena, male    DOB: [birth year], 54 y.o.   MRN: [id number]  Chief Complaint  Patient presents with   Follow-up    pt is here for cpap compliance    Referring provider: Delorse Limber  HPI:  54 year old male never smoker followed in our office for obstructive sleep apnea  Past medical history: Anxiety, hyperlipidemia, restless leg, hypertriglyceridemia Smoking history: Never smoker Maintenance: Requip Patient of Dr. Annamaria Boots  04/27/2020  - Visit   54 year old male never smoker followed in our office for obstructive sleep apnea.  Patient was last seen in our office in April/2021 this was a virtual visit.  Patient's pressure settings were changed to 5-15.  Patient was encouraged to remain on Protonix.  Patient is also managed on Requip for restless leg.  Patient reporting to our office today that he feels that he is doing okay.  He feels that his breathing is at baseline.  He has been attempting to use his CPAP as often as he can.  CPAP compliance report shows that he is attempting.  CPAP compliance report is listed below:  03/28/2020-04/27/2019 21-27 on last 30 days used, 9 of those days greater than 4 hours, average usage 3 hours and 26 minutes, APAP setting 5-15, 95th percentile 7.5, AHI 4.7, apnea index reveals central apneas 2.9  Patient reports he is having less night sweats that night.  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  He reports that this is benefiting him in his sleep.  He is taking some naps during the day.  He does not wear his CPAP when he naps.   Questionaires / Pulmonary Flowsheets:   ACT:  No flowsheet data found.  MMRC: No flowsheet data found.  Epworth:  Results of the Epworth flowsheet 03/02/2017  Sitting and reading 0  Watching TV 1  Sitting, inactive in a public place (e.g. a theatre or a meeting) 0  As a passenger in a car for an hour without a break 0  Lying down to rest in the afternoon when circumstances permit  3  Sitting and talking to someone 0  Sitting quietly after a lunch without alcohol 0  In a car, while stopped for a few minutes in traffic 0  Total score 4    Tests:   03/29/2017-home sleep study-AHI 7.5, SaO2 low 81%  FENO:  No results found for: NITRICOXIDE  PFT: No flowsheet data found.  WALK:  No flowsheet data found.  Imaging: No results found.  Lab Results:  CBC    Component Value Date/Time   WBC 8.0 08/01/2019 0810   RBC 5.20 08/01/2019 0810   HGB 15.3 08/01/2019 0810   HCT 44.9 08/01/2019 0810   PLT 196.0 08/01/2019 0810   MCV 86.4 08/01/2019 0810   MCH 29.3 08/17/2018 1632   MCHC 34.0 08/01/2019 0810   RDW 13.4 08/01/2019 0810   LYMPHSABS 1.6 08/01/2019 0810   MONOABS 1.0 08/01/2019 0810   EOSABS 0.2 08/01/2019 0810   BASOSABS 0.0 08/01/2019 0810    BMET    Component Value Date/Time   NA 142 08/01/2019 0810   K 4.3 08/01/2019 0810   CL 102 08/01/2019 0810   CO2 31 08/01/2019 0810   GLUCOSE 92 08/01/2019 0810   BUN 23 08/01/2019 0810   CREATININE 1.18 08/01/2019 0810   CREATININE 1.30 08/17/2018 1632   CALCIUM 9.9 08/01/2019 0810   GFRNONAA 71 (L) 02/22/2013 0001   GFRAA 82 (L) 02/22/2013  0001    BNP No results found for: BNP  ProBNP No results found for: PROBNP  Specialty Problems      Pulmonary Problems   Obstructive sleep apnea      Allergies  Allergen Reactions   Augmentin [Amoxicillin-Pot Clavulanate] Diarrhea    Immunization History  Administered Date(s) Administered   Influenza Inj Mdck Quad Pf 09/28/2018, 08/02/2019   Influenza,inj,Quad PF,6+ Mos 08/01/2014, 07/26/2017, 07/25/2018, 08/02/2019   Influenza,inj,quad, With Preservative 08/04/2016   Tdap 11/05/2015    Past Medical History:  Diagnosis Date   Allergy    Anxiety and depression    Heart palpitations    Hyperlipidemia    Prostate infection    Sleep apnea    CPAP     Tobacco History: Social History   Tobacco Use  Smoking Status Never  Smoker  Smokeless Tobacco Never Used   Counseling given: Yes  Continue to not smoke  Outpatient Encounter Medications as of 04/27/2020  Medication Sig   ALPRAZolam (XANAX) 0.25 MG tablet 1 TAB AT BEDTIME AS NEEDED FOR SLEEP. MAY TAKE AN ADDITIONAL TAB IN THE DAY FOR SEVERE ACUTE ANXIETY   atorvastatin (LIPITOR) 20 MG tablet TAKE 1 TABLET (20 MG TOTAL) BY MOUTH DAILY AT 6 PM.   buPROPion (WELLBUTRIN XL) 150 MG 24 hr tablet TAKE 1 TABLET BY MOUTH EVERY DAY   celecoxib (CELEBREX) 200 MG capsule Take 200 mg by mouth daily.   metoprolol succinate (TOPROL-XL) 25 MG 24 hr tablet TAKE 1/2 TABLET BY MOUTH EVERY DAY   Multiple Vitamin (MULTIVITAMIN) tablet Take 1 tablet by mouth daily.   pantoprazole (PROTONIX) 40 MG tablet TAKE 1 TABLET BY MOUTH EVERY DAY   rOPINIRole (REQUIP) 0.5 MG tablet TAKE 1 TAB NIGHTLY AT BEDTIME X1 WEEK AND THEN INCREASE TO 2 TABS (1MG ) NIGHTLY   sertraline (ZOLOFT) 100 MG tablet TAKE 1.5 TABLETS BY MOUTH EVERY DAY   [DISCONTINUED] gabapentin (NEURONTIN) 300 MG capsule TAKE 1 CAPSULE BY MOUTH EVERYDAY AT BEDTIME   No facility-administered encounter medications on file as of 04/27/2020.     Review of Systems  Review of Systems  Constitutional: Negative for activity change, chills, fatigue, fever and unexpected weight change.  HENT: Negative for postnasal drip, rhinorrhea, sinus pressure, sinus pain and sore throat.   Eyes: Negative.   Respiratory: Negative for cough, shortness of breath and wheezing.   Cardiovascular: Negative for chest pain and palpitations.  Gastrointestinal: Negative for constipation, diarrhea, nausea and vomiting.  Endocrine: Negative.   Genitourinary: Negative.   Musculoskeletal: Negative.   Skin: Negative.   Neurological: Negative for dizziness and headaches.  Psychiatric/Behavioral: Positive for sleep disturbance. Negative for dysphoric mood. The patient is not nervous/anxious.   All other systems reviewed and are negative.     Physical Exam  BP 128/78    Pulse 66    Temp 98.3 F (36.8 C) (Oral)    Ht 6\' 2"  (1.88 m)    Wt 228 lb (103.4 kg)    SpO2 97%    BMI 29.27 kg/m   Wt Readings from Last 5 Encounters:  04/27/20 228 lb (103.4 kg)  04/22/20 229 lb (103.9 kg)  09/17/19 215 lb (97.5 kg)  07/29/19 221 lb (100.2 kg)  07/03/19 221 lb (100.2 kg)    BMI Readings from Last 5 Encounters:  04/27/20 29.27 kg/m  04/22/20 29.40 kg/m  09/17/19 27.60 kg/m  07/29/19 28.37 kg/m  07/03/19 28.37 kg/m     Physical Exam Vitals and nursing note reviewed.  Constitutional:  General: He is not in acute distress.    Appearance: Normal appearance. He is normal weight.  HENT:     Head: Normocephalic and atraumatic.     Right Ear: Hearing and external ear normal.     Left Ear: Hearing and external ear normal.     Nose: No mucosal edema.     Right Turbinates: Not enlarged.     Left Turbinates: Not enlarged.     Mouth/Throat:     Mouth: Mucous membranes are dry.     Pharynx: Oropharynx is clear. No oropharyngeal exudate.  Eyes:     Pupils: Pupils are equal, round, and reactive to light.  Cardiovascular:     Rate and Rhythm: Normal rate and regular rhythm.     Pulses: Normal pulses.     Heart sounds: Normal heart sounds. No murmur heard.   Pulmonary:     Effort: Pulmonary effort is normal.     Breath sounds: Normal breath sounds. No decreased breath sounds, wheezing or rales.  Musculoskeletal:     Cervical back: Normal range of motion.     Right lower leg: No edema.     Left lower leg: No edema.  Lymphadenopathy:     Cervical: No cervical adenopathy.  Skin:    General: Skin is warm and dry.     Capillary Refill: Capillary refill takes less than 2 seconds.     Findings: No erythema or rash.  Neurological:     General: No focal deficit present.     Mental Status: He is alert and oriented to person, place, and time.     Motor: No weakness.     Coordination: Coordination normal.     Gait: Gait is  intact. Gait normal.  Psychiatric:        Mood and Affect: Mood normal.        Behavior: Behavior normal. Behavior is cooperative.        Thought Content: Thought content normal.        Judgment: Judgment normal.       Assessment & Plan:   Obstructive sleep apnea Patient's sleep quality is likely multifactorial directly related to previous history of neck surgery.  Due to his neck surgery patient is not able to get an adequate seal on his CPAP mask.  This is reflected on his CPAP compliance report.  Patient is working diligently to improve CPAP adherence.  Leaks are likely related to the fact the patient cannot have the CPAP mask completely tightened to his face.  He is tolerating the APAP pressures of 5-15 well.  Reviewed with the patient today that per his compliance report he is having some central apneas.  This could be related to some of the antidepressants and sedating medications that he takes.  Given the fact that he is at a stable interval with his mental health.  We will continue to monitor this clinically and do not recommend an changes at this time.  If his AHI starts to worsen or he is having difficulty with his sleep then we may need to consider changing these medications.  Offered patient a referral to his DME for a new mask if he wanted to try a simple nasal mask.  He declined today.  Plan: Continue CPAP therapy Follow-up in 1 year with Dr. Annamaria Boots  GERD (gastroesophageal reflux disease) Plan: Continue Protonix Reschedule endoscopy with gastroenterology  Restless leg syndrome Plan: Continue Requip    Return in about 1 year (around 04/27/2021), or if  symptoms worsen or fail to improve, for Follow up with Dr. Annamaria Boots.   [name], NP 04/27/2020   This appointment required 32 minutes of patient care (this includes precharting, chart review, review of results, face-to-face care, etc.).

## 2020-04-27 ENCOUNTER — Other Ambulatory Visit: Payer: Self-pay

## 2020-04-27 ENCOUNTER — Encounter: Payer: Self-pay | Admitting: Pulmonary Disease

## 2020-04-27 ENCOUNTER — Ambulatory Visit: Payer: BC Managed Care – PPO | Admitting: Pulmonary Disease

## 2020-04-27 VITALS — BP 128/78 | HR 66 | Temp 98.3°F | Ht 74.0 in | Wt 228.0 lb

## 2020-04-27 DIAGNOSIS — K219 Gastro-esophageal reflux disease without esophagitis: Secondary | ICD-10-CM

## 2020-04-27 DIAGNOSIS — G2581 Restless legs syndrome: Secondary | ICD-10-CM

## 2020-04-27 DIAGNOSIS — G4733 Obstructive sleep apnea (adult) (pediatric): Secondary | ICD-10-CM | POA: Diagnosis not present

## 2020-04-27 NOTE — Patient Instructions (Addendum)
You were seen today by [name], NP  for:   1. Obstructive sleep apnea   Continue hard work by using your CPAP I believe for an optimal management given the other barriers to your care such as neck pain If you become interested in trialing a different nasal mask just let our office know we can place an order for the DME company to reach out to you Based off the report you are having some central apneas which can be affected by some of the medications that you are on, if you feel like you are well managed from mental health standpoint this time believe we can continue to stay the course  If numbers start to become harder to control then we may need to reevaluate this  We will see back in 1 year   We recommend that you continue using your CPAP daily >>>Keep up the hard work using your device >>> Goal should be wearing this for the entire night that you are sleeping, at least 4 to 6 hours  Remember:  . Do not drive or operate heavy machinery if tired or drowsy.  . Please notify the supply company and office if you are unable to use your device regularly due to missing supplies or machine being broken.  . Work on maintaining a healthy weight and following your recommended nutrition plan  . Maintain proper daily exercise and movement  . Maintaining proper use of your device can also help improve management of other chronic illnesses such as: Blood pressure, blood sugars, and weight management.   BiPAP/ CPAP Cleaning:  >>>Clean weekly, with Dawn soap, and bottle brush.  Set up to air dry. >>> Wipe mask out daily with wet wipe or towelette    Follow Up:    Return in about 1 year (around 04/27/2021), or if symptoms worsen or fail to improve, for Follow up with Dr. Annamaria Boots.   Please do your part to reduce the spread of COVID-19:      Reduce your risk of any infection  and COVID19 by using the similar precautions used for avoiding the common cold or flu:  Marland Kitchen Wash your hands often  with soap and warm water for at least 20 seconds.  If soap and water are not readily available, use an alcohol-based hand sanitizer with at least 60% alcohol.  . If coughing or sneezing, cover your mouth and nose by coughing or sneezing into the elbow areas of your shirt or coat, into a tissue or into your sleeve (not your hands). Langley Gauss A MASK when in public  . Avoid shaking hands with others and consider head nods or verbal greetings only. . Avoid touching your eyes, nose, or mouth with unwashed hands.  . Avoid close contact with people who are sick. . Avoid places or events with large numbers of people in one location, like concerts or sporting events. . If you have some symptoms but not all symptoms, continue to monitor at home and seek medical attention if your symptoms worsen. . If you are having a medical emergency, call 911.   [place] / e-Visit: [email or web address]         [facility]: [phone]  [facility]: [phone]                   [facility]: [phone]     It is flu season:   >>> Best ways  to protect herself from the flu: Receive the yearly flu vaccine, practice good hand hygiene washing with soap and also using hand sanitizer when available, eat a nutritious meals, get adequate rest, hydrate appropriately   Please contact the office if your symptoms worsen or you have concerns that you are not improving.   Thank you for choosing [facility] for your healthcare, and for allowing [place] to partner with you on your healthcare journey. I am thankful to be able to provide care to you today.   Wyn Quaker FNP-C

## 2020-04-27 NOTE — Assessment & Plan Note (Signed)
Patient's sleep quality is likely multifactorial directly related to previous history of neck surgery.  Due to his neck surgery patient is not able to get an adequate seal on his CPAP mask.  This is reflected on his CPAP compliance report.  Patient is working diligently to improve CPAP adherence.  Leaks are likely related to the fact the patient cannot have the CPAP mask completely tightened to his face.  He is tolerating the APAP pressures of 5-15 well.  Reviewed with the patient today that per his compliance report he is having some central apneas.  This could be related to some of the antidepressants and sedating medications that he takes.  Given the fact that he is at a stable interval with his mental health.  We will continue to monitor this clinically and do not recommend an changes at this time.  If his AHI starts to worsen or he is having difficulty with his sleep then we may need to consider changing these medications.  Offered patient a referral to his DME for a new mask if he wanted to try a simple nasal mask.  He declined today.  Plan: Continue CPAP therapy Follow-up in 1 year with Dr. Annamaria Boots

## 2020-04-27 NOTE — Assessment & Plan Note (Signed)
Plan: Continue Protonix Reschedule endoscopy with gastroenterology

## 2020-04-27 NOTE — Assessment & Plan Note (Signed)
Plan: Continue Requip

## 2020-04-29 ENCOUNTER — Ambulatory Visit: Payer: BC Managed Care – PPO | Admitting: Internal Medicine

## 2020-05-11 ENCOUNTER — Ambulatory Visit: Payer: BC Managed Care – PPO | Admitting: Physician Assistant

## 2020-05-11 ENCOUNTER — Encounter: Payer: Self-pay | Admitting: Physician Assistant

## 2020-05-11 ENCOUNTER — Other Ambulatory Visit: Payer: Self-pay

## 2020-05-11 DIAGNOSIS — L570 Actinic keratosis: Secondary | ICD-10-CM

## 2020-05-11 DIAGNOSIS — C4441 Basal cell carcinoma of skin of scalp and neck: Secondary | ICD-10-CM | POA: Diagnosis not present

## 2020-05-11 DIAGNOSIS — D2261 Melanocytic nevi of right upper limb, including shoulder: Secondary | ICD-10-CM

## 2020-05-11 DIAGNOSIS — Z86018 Personal history of other benign neoplasm: Secondary | ICD-10-CM

## 2020-05-11 DIAGNOSIS — D489 Neoplasm of uncertain behavior, unspecified: Secondary | ICD-10-CM

## 2020-05-11 DIAGNOSIS — L820 Inflamed seborrheic keratosis: Secondary | ICD-10-CM

## 2020-05-11 DIAGNOSIS — Z87898 Personal history of other specified conditions: Secondary | ICD-10-CM

## 2020-05-11 MED ORDER — TOLAK 4 % EX CREA
TOPICAL_CREAM | CUTANEOUS | 0 refills | Status: DC
Start: 2020-05-11 — End: 2020-05-28

## 2020-05-11 NOTE — Patient Instructions (Signed)

## 2020-05-11 NOTE — Progress Notes (Addendum)
Follow up Visit  Subjective  [place] is a 54 y.o. male who presents for the following: Annual Exam.Crusty spots right upper forehead. Mole right neck raised and gets tender. New in the last month. Spots around the ankle and top of feet to be frozen.  Objective  Well appearing patient in no apparent distress; mood and affect are within normal limits.  Face, chest, back, arms, lower legs, feet examined. Relevant physical exam findings are noted in the Assessment and Plan. No suspicious moles noted on back.   Objective  Left Dorsum of Foot (8), Neck - Anterior (2), Right Dorsum of Foot (7), Right Lower Leg - Anterior: Erythematous stuck-on, waxy papule or plaque.   Images      Right Upper Arm - Anterior     Right Anterior Neck     Objective  Right Forehead: Erythematous patches with gritty scale.  Objective  Mid Back, Right Abdomen (side) - Upper: 2011  Assessment & Plan  Inflamed seborrheic keratosis (18) Neck - Anterior (2); Right Lower Leg - Anterior; Left Dorsum of Foot (8); Right Dorsum of Foot (7)  Destruction of lesion - Left Dorsum of Foot, Neck - Anterior (2), Right Dorsum of Foot, Right Lower Leg - Anterior Complexity: simple   Destruction method: cryotherapy   Informed consent: discussed and consent obtained   Timeout:  patient name, date of birth, surgical site, and procedure verified Lesion destroyed using liquid nitrogen: Yes   Outcome: patient tolerated procedure well with no complications    Neoplasm of uncertain behavior (2) Right Upper Arm - Anterior  Skin / nail biopsy Type of biopsy: tangential   Informed consent: discussed and consent obtained   Timeout: patient name, date of birth, surgical site, and procedure verified   Procedure prep:  Patient was prepped and draped in usual sterile fashion (Non sterile) Prep type:  Chlorhexidine Anesthesia: the lesion was anesthetized in a standard fashion   Anesthetic:  1% lidocaine w/  epinephrine 1-100,000 local infiltration Instrument used: flexible razor blade   Outcome: patient tolerated procedure well   Post-procedure details: wound care instructions given    Specimen 2 - Surgical pathology Differential Diagnosis: atypia Check Margins: No  Right Anterior Neck  Skin / nail biopsy Type of biopsy: tangential   Informed consent: discussed and consent obtained   Timeout: patient name, date of birth, surgical site, and procedure verified   Procedure prep:  Patient was prepped and draped in usual sterile fashion (Non sterile) Prep type:  Chlorhexidine Anesthesia: the lesion was anesthetized in a standard fashion   Anesthetic:  1% lidocaine w/ epinephrine 1-100,000 local infiltration Instrument used: flexible razor blade   Outcome: patient tolerated procedure well   Post-procedure details: wound care instructions given    Specimen 1 - Surgical pathology Differential Diagnosis: BCC Check Margins: No  Actinic keratosis Right Forehead  We froze one lesion on his right upper forehead today. He is interested in using Tolak to his whole face in the fall. We discussed using 1 week taking 3 off and restarting for 1 week. He is aware of the worse before better. He was told to use a thin film and to avoid his eyes.   Destruction of lesion - Right Forehead Complexity: simple   Destruction method: cryotherapy   Informed consent: discussed and consent obtained   Timeout:  patient name, date of birth, surgical site, and procedure verified Lesion destroyed using liquid nitrogen: Yes   Outcome: patient tolerated procedure well with  no complications    Fluorouracil (TOLAK) 4 % CREA - Right Forehead  History of atypical nevus (2) Right Abdomen (side) - Upper; Mid Back

## 2020-05-12 ENCOUNTER — Other Ambulatory Visit: Payer: Self-pay | Admitting: Physician Assistant

## 2020-05-13 ENCOUNTER — Encounter: Payer: Self-pay | Admitting: Physician Assistant

## 2020-05-13 ENCOUNTER — Other Ambulatory Visit: Payer: Self-pay | Admitting: Internal Medicine

## 2020-05-13 DIAGNOSIS — R002 Palpitations: Secondary | ICD-10-CM

## 2020-05-15 NOTE — Telephone Encounter (Signed)
-----   Message from Arlyss Gandy, [place] sent at 05/13/2020  5:10 PM EDT ----- 30 min for neck. Can be with Vida Roller if she has room

## 2020-05-15 NOTE — Telephone Encounter (Signed)
Phone call to patient with his pathology results. Patient aware.  

## 2020-05-18 ENCOUNTER — Other Ambulatory Visit: Payer: Self-pay | Admitting: Physician Assistant

## 2020-05-18 ENCOUNTER — Telehealth: Payer: Self-pay | Admitting: Internal Medicine

## 2020-05-18 ENCOUNTER — Other Ambulatory Visit: Payer: Self-pay | Admitting: Internal Medicine

## 2020-05-18 DIAGNOSIS — E785 Hyperlipidemia, unspecified: Secondary | ICD-10-CM

## 2020-05-18 DIAGNOSIS — R002 Palpitations: Secondary | ICD-10-CM

## 2020-05-18 NOTE — Telephone Encounter (Signed)
New Message    *STAT* If patient is at the pharmacy, call can be transferred to refill team.   1. Which medications need to be refilled? (please list name of each medication and dose if known) metoprolol succinate (TOPROL-XL) 25 MG 24 hr tablet   2. Which pharmacy/location (including street and city if local pharmacy) is medication to be sent to? [id number] - [place], [place], [place] [address]   3. Do they need a 30 day or 90 day supply? [place]

## 2020-05-18 NOTE — Telephone Encounter (Signed)
Pt's medications was sent to pt's pharmacy as requested. Confirmation received.  

## 2020-05-28 ENCOUNTER — Telehealth: Payer: Self-pay | Admitting: Physician Assistant

## 2020-05-28 ENCOUNTER — Other Ambulatory Visit: Payer: Self-pay

## 2020-05-28 MED ORDER — TOLAK 4 % EX CREA
1.0000 | TOPICAL_CREAM | Freq: Every day | CUTANEOUS | 1 refills | Status: DC
Start: 2020-05-28 — End: 2020-08-13

## 2020-05-28 NOTE — Telephone Encounter (Signed)
Pharmacy told him that cream Rx'd on last visit has been discontinued (he doesn't know the name, but says there was only 1 Rx). [facility]acy: [facility]

## 2020-06-06 ENCOUNTER — Other Ambulatory Visit: Payer: Self-pay | Admitting: Physician Assistant

## 2020-06-14 ENCOUNTER — Other Ambulatory Visit: Payer: Self-pay | Admitting: Internal Medicine

## 2020-06-14 DIAGNOSIS — R002 Palpitations: Secondary | ICD-10-CM

## 2020-06-15 ENCOUNTER — Other Ambulatory Visit: Payer: Self-pay

## 2020-06-15 DIAGNOSIS — R002 Palpitations: Secondary | ICD-10-CM

## 2020-06-15 MED ORDER — METOPROLOL SUCCINATE ER 25 MG PO TB24: 12.5000 mg | ORAL_TABLET | Freq: Every day | ORAL | 1 refills | Status: DC

## 2020-06-29 ENCOUNTER — Ambulatory Visit: Payer: BC Managed Care – PPO | Admitting: Physician Assistant

## 2020-07-02 ENCOUNTER — Encounter: Payer: Self-pay | Admitting: Physician Assistant

## 2020-07-02 ENCOUNTER — Other Ambulatory Visit: Payer: Self-pay

## 2020-07-02 ENCOUNTER — Ambulatory Visit (INDEPENDENT_AMBULATORY_CARE_PROVIDER_SITE_OTHER): Payer: BC Managed Care – PPO | Admitting: Physician Assistant

## 2020-07-02 DIAGNOSIS — C4441 Basal cell carcinoma of skin of scalp and neck: Secondary | ICD-10-CM | POA: Diagnosis not present

## 2020-07-02 DIAGNOSIS — L570 Actinic keratosis: Secondary | ICD-10-CM

## 2020-07-02 MED ORDER — TOLAK 4 % EX CREA
1.0000 | TOPICAL_CREAM | Freq: Every day | CUTANEOUS | 1 refills | Status: DC
Start: 2020-07-02 — End: 2020-08-13

## 2020-07-02 NOTE — Progress Notes (Addendum)
   Follow up Visit  Subjective  [place] is a 54 y.o. male who presents for the following: Procedure (BCC x 1 Right Anterior Neck). Has healed well. He also has some crusty areas on his scalp.  Objective  Well appearing patient in no apparent distress; mood and affect are within normal limits.  A focused examination was performed including head, including the scalp, face, neck, nose, ears, eyelids, and lips. Relevant physical exam findings are noted in the Assessment and Plan.   Objective  Right Anterior Neck: Biopsy scar identified.   Objective  Mid Frontal Scalp (3): Erythematous patches with gritty scale.  Assessment & Plan  Basal cell carcinoma (BCC) of skin of neck Right Anterior Neck  Destruction of lesion Complexity: simple   Destruction method: electrodesiccation and curettage   Informed consent: discussed and consent obtained   Timeout:  patient name, date of birth, surgical site, and procedure verified Anesthesia: the lesion was anesthetized in a standard fashion   Anesthetic:  1% lidocaine w/ epinephrine 1-100,000 local infiltration Curettage performed in three different directions: Yes   Curettage cycles:  3 Lesion length (cm):  1 Lesion width (cm):  1 Margin per side (cm):  0 Final wound size (cm):  1 Hemostasis achieved with:  ferric subsulfate Outcome: patient tolerated procedure well with no complications   Additional details:  Wound innoculated with 5 fluorouracil solution.  AK (actinic keratosis) (3) Mid Frontal Scalp  Destruction of lesion - Mid Frontal Scalp Complexity: simple   Destruction method: cryotherapy   Informed consent: discussed and consent obtained   Timeout:  patient name, date of birth, surgical site, and procedure verified Lesion destroyed using liquid nitrogen: Yes   Outcome: patient tolerated procedure well with no complications

## 2020-07-02 NOTE — Patient Instructions (Signed)

## 2020-07-03 ENCOUNTER — Other Ambulatory Visit: Payer: Self-pay | Admitting: Physician Assistant

## 2020-07-03 DIAGNOSIS — F5102 Adjustment insomnia: Secondary | ICD-10-CM

## 2020-07-07 NOTE — Telephone Encounter (Signed)
Last OV 04/22/20 Alprazolam last filled 01/16/20 #20 with 1

## 2020-07-13 ENCOUNTER — Other Ambulatory Visit: Payer: Self-pay | Admitting: Physician Assistant

## 2020-07-13 DIAGNOSIS — R002 Palpitations: Secondary | ICD-10-CM

## 2020-07-20 ENCOUNTER — Telehealth: Payer: Self-pay | Admitting: *Deleted

## 2020-07-20 NOTE — Telephone Encounter (Signed)
   [facility] Pre-operative Risk Assessment    HEARTCARE STAFF: - Please ensure there is not already an duplicate clearance open for this procedure. - Under Visit Info/Reason for Call, type in Other and utilize the format Clearance MM/DD/YY or Clearance TBD. Do not use dashes or single digits. - If request is for dental extraction, please clarify the # of teeth to be extracted.  Request for surgical clearance:  1. What type of surgery is being performed?  LEFT SHOULDER SCOPE   2. When is this surgery scheduled?  08/17/2020   3. What type of clearance is required (medical clearance vs. Pharmacy clearance to hold med vs. Both)?  MEDICAL  4. Are there any medications that need to be held prior to surgery and how long? N/A   5. Practice name and name of physician performing surgery?  [facility] / DR. VARKEY   6. What is the office phone number?  [phone]   7.   What is the office fax number?  [phone] ATTN:  SHERRIE  8.   Anesthesia type (None, local, MAC, general) ?     [name] 07/20/2020, 8:59 AM  _________________________________________________________________   (provider comments below)

## 2020-07-20 NOTE — Telephone Encounter (Signed)
   Primary Cardiologist:Paula Harrington Challenger, MD  Chart reviewed as part of pre-operative protocol coverage. Because of Quindarrius [place]lin Satterfield's past medical history and time since last visit, they will require a follow-up visit in order to better assess preoperative cardiovascular risk.  His last OV was in over 2.5 years ago in 12/2017. He already has a f/u scheduled in 2 days (07/22/20) with Richardson Dopp PA-C. Added "preop clearance" to appointment notes and will also cc this message to Nicki Reaper so he is aware. Will remove from pre-op box.  Charlie Pitter, PA-C  07/20/2020, 12:30 PM

## 2020-07-22 ENCOUNTER — Ambulatory Visit: Payer: BC Managed Care – PPO | Admitting: Physician Assistant

## 2020-07-22 NOTE — Progress Notes (Deleted)
Cardiology Office Note:    Date:  07/22/2020   ID:  Francisco Pena, DOB 1966-10-02, MRN [id number]  PCP:  Francisco Jeans, PA-C  [facility] Cardiologist:  Francisco Carnes, MD *** [place] Electrophysiologist:  None   Referring MD: Francisco Jeans, PA-C   Chief Complaint:  No chief complaint on file.    Patient Profile:    Francisco Pena is a 54 y.o. male with:   Anxiety   Hyperlipidemia   Palpitations  Monitor 4/16: PACs  Chest pain   Echocardiogram 4/16: EF 55-60, mild TR  S/p lumbar spine surgery in 2017  OSA  Prior CV studies: Echo 02/05/15 EF 55-60, normal wall motion, normal diastolic function, mild TR  Event monitor 4/16 NSR with occasional PACs  History of Present Illness:    Francisco Pena ***      Past Medical History:  Diagnosis Date  . Allergy   . Anxiety and depression   . Atypical mole 06/11/2010   Lower Mid Back (mild to moderate)  . Atypical mole 06/11/2010   Right Abdomen (mild)  . Heart palpitations   . Hyperlipidemia   . Prostate infection   . Sleep apnea    CPAP     Current Medications: No outpatient medications have been marked as taking for the 07/22/20 encounter (Appointment) with Francisco Pena T, PA-C.     Allergies:   Augmentin [amoxicillin-pot clavulanate]   Social History   Tobacco Use  . Smoking status: Never Smoker  . Smokeless tobacco: Never Used  Vaping Use  . Vaping Use: Never used  Substance Use Topics  . Alcohol use: Yes    Comment: occ beer  . Drug use: No     Family Hx: The patient's family history includes Colon polyps in his brother; Heart disease in his father; Hypertension in his father, mother, and sister. There is no history of Colon cancer, Esophageal cancer, Rectal cancer, or Stomach cancer.  ROS   EKGs/Labs/Other Test Reviewed:    EKG:  EKG is *** ordered today.  The ekg ordered today demonstrates ***  Recent Labs: 08/01/2019: ALT 20; BUN 23; Creatinine, Ser 1.18; Hemoglobin 15.3;  Platelets 196.0; Potassium 4.3; Sodium 142; TSH 2.01   Recent Lipid Panel Lab Results  Component Value Date/Time   CHOL 167 04/22/2020 09:13 AM   TRIG 198.0 (H) 04/22/2020 09:13 AM   HDL 52.10 04/22/2020 09:13 AM   CHOLHDL 3 04/22/2020 09:13 AM   LDLCALC 75 04/22/2020 09:13 AM   LDLDIRECT 69.0 05/29/2018 10:49 AM    Physical Exam:    VS:  There were no vitals taken for this visit.    Wt Readings from Last 3 Encounters:  04/27/20 228 lb (103.4 kg)  04/22/20 229 lb (103.9 kg)  09/17/19 215 lb (97.5 kg)     Physical Exam ***  ASSESSMENT & PLAN:    ***{ Click Here to Calculate RCRI      :836629476}  { Click Here to Calculate DASI      :546503546} {Select to add RCRI Risk (<1%=LOW; >/=1%=HIGH) (Optional):21036017}  {Select if HIGH (RCRI >/=1%) Risk (Optional):21036030} Recommendations: {2014 ACC/AHA Perioperative Guidelines  :21036001} Antiplatelet and/or Anticoagulation Recommendations: {Antiplatelet Recommendations                  :21036016} {Anticoagulation Recommendations           :21036019}   1. Palpitations - Hx of normal Echo in 2016 and event monitor with occasional PACs.  His symptoms have  been well controlled on beta-blocker therapy and he prefers to continue.  Continue current medical regimen.  FU here 1 year or as needed.  If he desires, he can get his PCP to fill his beta-blocker for him and FU here prn.    Dispo:  No follow-ups on file.   Medication Adjustments/Labs and Tests Ordered: Current medicines are reviewed at length with the patient today.  Concerns regarding medicines are outlined above.  Tests Ordered: No orders of the defined types were placed in this encounter.  Medication Changes: No orders of the defined types were placed in this encounter.   Signed, Francisco Dopp, PA-C  07/22/2020 1:31 PM    [facility] [address], [place], [place]  [address] Phone: [phone]; Fax: [phone]

## 2020-07-22 NOTE — Telephone Encounter (Signed)
Pt was a no show 07/22/2020. Richardson Dopp, PA-C    07/22/2020 4:26 PM

## 2020-08-09 ENCOUNTER — Other Ambulatory Visit: Payer: Self-pay | Admitting: Physician Assistant

## 2020-08-12 ENCOUNTER — Telehealth: Payer: Self-pay | Admitting: Internal Medicine

## 2020-08-12 NOTE — Telephone Encounter (Signed)
Add on for tomorrow 10/14 with Dr. Harrington Challenger at 9:00am

## 2020-08-12 NOTE — Progress Notes (Signed)
Cardiology Office Note   Date:  08/13/2020   ID:  ZACCAI CHAVARIN, DOB September 10, 1966, MRN [id number]  PCP:  Brunetta Jeans, PA-C  Cardiologist:   Dorris Carnes, MD   No chief complaint on file.     History of Present Illness: Francisco Pena is a 54 y.o. male who I saw in 2016 for palpitations Also a hx of HL, OSA and chest tightness    Echo showed normal LVEF   Event monitor showed SR with occasoinal PAC  Pt controlled with low dose Toprol  He was last seen by Buren Kos in March 2019  Since      Current Outpatient Medications  Medication Sig Dispense Refill   ALPRAZolam (XANAX) 0.25 MG tablet 1 TAB AT BEDTIME AS NEEDED FOR SLEEP. MAY TAKE AN ADDITIONAL TAB IN THE DAY FOR SEVERE ACUTE ANXIETY 20 tablet 1   atorvastatin (LIPITOR) 20 MG tablet TAKE 1 TABLET (20 MG TOTAL) BY MOUTH DAILY AT 6 PM. 90 tablet 1   buPROPion (WELLBUTRIN XL) 150 MG 24 hr tablet TAKE 1 TABLET BY MOUTH EVERY DAY 90 tablet 1   celecoxib (CELEBREX) 200 MG capsule Take 200 mg by mouth daily.     DEXILANT 60 MG capsule Take 1 capsule by mouth every morning.     metoprolol succinate (TOPROL-XL) 25 MG 24 hr tablet Take 0.5 tablets (12.5 mg total) by mouth daily. 45 tablet 0   Multiple Vitamin (MULTIVITAMIN) tablet Take 1 tablet by mouth daily.     rOPINIRole (REQUIP) 0.5 MG tablet Take 2 tablets (1 mg total) by mouth daily. 180 tablet 1   sertraline (ZOLOFT) 100 MG tablet TAKE 1.5 TABLETS BY MOUTH EVERY DAY 135 tablet 1   No current facility-administered medications for this visit.    Allergies:   Augmentin [amoxicillin-pot clavulanate]   Past Medical History:  Diagnosis Date   Allergy    Anxiety and depression    Atypical mole 06/11/2010   Lower Mid Back (mild to moderate)   Atypical mole 06/11/2010   Right Abdomen (mild)   Heart palpitations    Hyperlipidemia    Prostate infection    Sleep apnea    CPAP     Past Surgical History:  Procedure Laterality Date   COLONOSCOPY  at age  26    in [place]-normal exam per pt   SPINE SURGERY Right    disc shaved on right side   TONSILLECTOMY       Social History:  The patient  reports that he has never smoked. He has never used smokeless tobacco. He reports current alcohol use. He reports that he does not use drugs.   Family History:  The patient's family history includes Colon polyps in his brother; Heart disease in his father; Hypertension in his father, mother, and sister.    ROS:  Please see the history of present illness. All other systems are reviewed and  Negative to the above problem except as noted.    PHYSICAL EXAM: VS:  BP 118/70    Pulse 63    Ht 6\' 2"  (1.88 m)    Wt 222 lb 9.6 oz (101 kg)    SpO2 97%    BMI 28.58 kg/m   GEN: Well nourished, well developed, in no acute distress  HEENT: normal  Neck: no JVD  No carotid bruits Cardiac: RRR; no murmurs  No LE edema  Respiratory:  clear to auscultation bilaterally, normal work of breathing GI: soft, nontender, nondistended, +  BS  No hepatomegaly  MS: no deformity Moving all extremities   Skin: warm and dry, no rash Neuro:  Strength and sensation are intact Psych: euthymic mood, full affect   EKG:  EKG is ordered today.  SB  51 bpm Incomp RBBB.   Lipid Panel    Component Value Date/Time   CHOL 167 04/22/2020 0913   TRIG 198.0 (H) 04/22/2020 0913   HDL 52.10 04/22/2020 0913   CHOLHDL 3 04/22/2020 0913   VLDL 39.6 04/22/2020 0913   LDLCALC 75 04/22/2020 0913   LDLDIRECT 69.0 05/29/2018 1049      Wt Readings from Last 3 Encounters:  08/13/20 222 lb 9.6 oz (101 kg)  04/27/20 228 lb (103.4 kg)  04/22/20 229 lb (103.9 kg)      ASSESSMENT AND PLAN:  1.  Palpitations   Pt denies symptoms   2   Lipids   Last lipids a few months ago:  LDL 75  HDL 198   Discussed diet   Cut back on glu/carbs, processed food   Try intermitt fasting   3   Preop evaluation.  Pt active  No symptoms of angina  From a cardiac standpoint I think he is a low risk  for major cardiac event and OK to proceed.  F/U in 1 year  Signed, Dorris Carnes, MD  08/13/2020 9:43 AM    [facility] [place] [place], [place], [place]  [address] Phone: [phone]; Fax: [phone]

## 2020-08-13 ENCOUNTER — Other Ambulatory Visit: Payer: Self-pay

## 2020-08-13 ENCOUNTER — Ambulatory Visit: Payer: BC Managed Care – PPO | Admitting: Internal Medicine

## 2020-08-13 ENCOUNTER — Encounter: Payer: Self-pay | Admitting: Internal Medicine

## 2020-08-13 VITALS — BP 118/70 | HR 63 | Ht 74.0 in | Wt 222.6 lb

## 2020-08-13 DIAGNOSIS — R002 Palpitations: Secondary | ICD-10-CM

## 2020-08-13 NOTE — Patient Instructions (Signed)
Medication Instructions:  No changes *If you need a refill on your cardiac medications before your next appointment, please call your pharmacy*   Lab Work: none   Testing/Procedures: none   Follow-Up: At [facility], you and your health needs are our priority.  As part of our continuing mission to provide you with exceptional heart care, we have created designated Provider Care Teams.  These Care Teams include your primary Cardiologist (physician) and Advanced Practice Providers (APPs -  Physician Assistants and Nurse Practitioners) who all work together to provide you with the care you need, when you need it.  Your next appointment:   12 month(s)  The format for your next appointment:   In Person  Provider:   You may see Paula Ross, MD or one of the following Advanced Practice Providers on your designated Care Team:    Scott Weaver, PA-C  Vin Bhagat, PA-C    Other Instructions   

## 2020-08-13 NOTE — Telephone Encounter (Signed)
Patient had an appointment with you today August 13, 2020 at 9 AM.  Given that you have ordered cardiac event monitor and echocardiogram, I will defer cardiac clearance to you at this time.  I will remove from preop pool.  Please forward your recommendations to requesting office once test results have resulted.  Thank you for your help.  Jossie Ng. Musa Rewerts NP-C    08/13/2020, 12:06 PM [address] Office [phone] Fax [phone]

## 2020-08-13 NOTE — Telephone Encounter (Signed)
Patient called with questions regarding surgical clearance.

## 2020-08-14 ENCOUNTER — Telehealth: Payer: Self-pay | Admitting: Internal Medicine

## 2020-08-14 NOTE — Telephone Encounter (Signed)
My office note provides clearance

## 2020-08-14 NOTE — Telephone Encounter (Signed)
I am not working pre op today so I will forward this call to the pre op pool for further.

## 2020-08-14 NOTE — Telephone Encounter (Signed)
Called the requesting office spoke with Claiborne Billings and informed her that the patient was cleared by Dr. Harrington Challenger at his 08/13/20 appointment. She asked about an echocardiogram and I informed her that patient was cleared and that there was not an active order for an Echo and that there was a misunderstanding when reading the previous notes attached to the clearance that was sent to their office. She thanked me for calling.

## 2020-08-14 NOTE — Telephone Encounter (Signed)
Patients wife calling in regarding surgical clearance. She states that preop says he has to have a heart monitor and EKG prior to surgery. It looks like at his appointment on 08/13/2020 with Dr. Harrington Challenger he was cleared. Please advise/call.

## 2020-08-14 NOTE — Telephone Encounter (Signed)
Called the requesting office and left a voice message to give me a call back.

## 2020-08-14 NOTE — Telephone Encounter (Signed)
Melissa the patient's wife is calling requesting her husband be called in regards to what is needed before he can be cleared for surgery. She states they are confused due to Dr. Harrington Challenger saying everything seemed fine and she will see him back in a year, but the office performing the procedure calling them stating he is not cleared until an Echo is performed and his heart monitor is worn. There is also no active request in the system for either of these. The patient's surgery is scheduled for Monday. The best contact number for Francisco Pena is [phone]. 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 Please advise.

## 2020-08-14 NOTE — Telephone Encounter (Signed)
   Primary Cardiologist: Dorris Carnes, MD  Chart reviewed as part of pre-operative protocol coverage. Given past medical history and time since last visit, based on ACC/AHA guidelines, Francisco Pena would be at acceptable risk for the planned procedure without further cardiovascular testing. Patient was seen by Dr. Harrington Challenger on 08/13/2020, EKG obtained at the time showed normal sinus rhythm with chronic RBBB that's unchanged when compare to the previous EKG. He was cleared from cardiac perspective to proceed with orthopedic surgery without further workup.   The patient was advised that if he develops new symptoms prior to surgery to contact our office to arrange for a follow-up visit, and he verbalized understanding.  I will route this recommendation to the requesting party via Epic fax function and remove from pre-op pool.  Please call with questions.  Almyra Deforest, [place] 08/14/2020, 10:31 AM

## 2020-09-09 ENCOUNTER — Ambulatory Visit: Payer: BC Managed Care – PPO | Admitting: Physician Assistant

## 2020-09-30 ENCOUNTER — Ambulatory Visit: Payer: BC Managed Care – PPO | Admitting: Dermatology

## 2020-09-30 ENCOUNTER — Ambulatory Visit: Payer: BC Managed Care – PPO | Admitting: Physician Assistant

## 2020-10-15 ENCOUNTER — Other Ambulatory Visit: Payer: Self-pay | Admitting: Physician Assistant

## 2020-10-15 ENCOUNTER — Other Ambulatory Visit: Payer: Self-pay | Admitting: Internal Medicine

## 2020-10-15 DIAGNOSIS — R002 Palpitations: Secondary | ICD-10-CM

## 2020-10-20 ENCOUNTER — Ambulatory Visit (INDEPENDENT_AMBULATORY_CARE_PROVIDER_SITE_OTHER): Payer: BC Managed Care – PPO | Admitting: Dermatology

## 2020-10-20 ENCOUNTER — Encounter: Payer: Self-pay | Admitting: Dermatology

## 2020-10-20 ENCOUNTER — Other Ambulatory Visit: Payer: Self-pay

## 2020-10-20 DIAGNOSIS — Z1283 Encounter for screening for malignant neoplasm of skin: Secondary | ICD-10-CM

## 2020-10-20 DIAGNOSIS — Z85828 Personal history of other malignant neoplasm of skin: Secondary | ICD-10-CM

## 2020-10-20 DIAGNOSIS — L738 Other specified follicular disorders: Secondary | ICD-10-CM

## 2020-10-20 DIAGNOSIS — L918 Other hypertrophic disorders of the skin: Secondary | ICD-10-CM

## 2020-10-20 DIAGNOSIS — L821 Other seborrheic keratosis: Secondary | ICD-10-CM

## 2020-10-20 DIAGNOSIS — D1800 Hemangioma unspecified site: Secondary | ICD-10-CM

## 2020-10-21 ENCOUNTER — Encounter: Payer: Self-pay | Admitting: Dermatology

## 2020-10-21 NOTE — Progress Notes (Signed)
   Follow-Up Visit   Subjective  Francisco Pena is a 54 y.o. male who presents for the following: Follow-up (3 month f/u- right anterior neck- no concerns).  General skin examination Location:  Duration:  Quality:  Associated Signs/Symptoms: Modifying Factors:  Severity:  Timing: Context: History of basal cell carcinoma  Objective  Well appearing patient in no apparent distress; mood and affect are within normal limits. Objective  Right Anterior Neck: White scar-clear  Objective  Left Upper Back: Waist up skin examination- no atypical moles or non mole skin cancer  Objective  Right Upper Back: Multiple red raised 1 to 2 mm papules papule  Objective  Left Axilla, Right Axilla: Fleshy, skin-colored sessile and pedunculated papules.    Objective  Right Temple: For millimeter brown textured flat topped papule  Objective  Glabella: Small yellow papules with a central dell.     A full examination was performed including scalp, head, eyes, ears, nose, lips, neck, chest, axillae, abdomen, back, buttocks, bilateral upper extremities, bilateral lower extremities, hands, feet, fingers, toes, fingernails, and toenails. All findings within normal limits unless otherwise noted below.   Assessment & Plan    History of basal cell cancer Right Anterior Neck  Yearly skin check  Encounter for screening for malignant neoplasm of skin Left Upper Back  Yearly skin check, encouraged to examine his own skin twice yearly.  Hemangioma, unspecified site Right Upper Back  No need to remove  Skin tag (2) Left Axilla; Right Axilla  No need for removal  Seborrheic keratosis Right Temple  No need for removal if stable  Sebaceous hyperplasia of face Glabella  No need for treatment unless clinical change.      I, Lavonna Monarch, MD, have reviewed all documentation for this visit.  The documentation on 10/21/20 for the exam, diagnosis, procedures, and orders are all  accurate and complete.

## 2020-10-28 ENCOUNTER — Other Ambulatory Visit: Payer: Self-pay | Admitting: Physician Assistant

## 2020-10-28 ENCOUNTER — Encounter: Payer: Self-pay | Admitting: Physician Assistant

## 2020-10-28 DIAGNOSIS — F5102 Adjustment insomnia: Secondary | ICD-10-CM

## 2020-10-28 DIAGNOSIS — E785 Hyperlipidemia, unspecified: Secondary | ICD-10-CM

## 2020-10-28 DIAGNOSIS — G8929 Other chronic pain: Secondary | ICD-10-CM

## 2020-10-28 NOTE — Telephone Encounter (Signed)
LFD 07/08/20 #20 with 1 refill LOV 04/22/20 NOV none

## 2020-11-02 MED ORDER — GABAPENTIN 300 MG PO CAPS: 600.0000 mg | ORAL_CAPSULE | Freq: Every day | ORAL | 1 refills | Status: DC

## 2020-11-19 ENCOUNTER — Other Ambulatory Visit: Payer: Self-pay | Admitting: Physician Assistant

## 2020-11-19 DIAGNOSIS — E785 Hyperlipidemia, unspecified: Secondary | ICD-10-CM

## 2020-11-29 ENCOUNTER — Other Ambulatory Visit: Payer: Self-pay | Admitting: Physician Assistant

## 2020-11-29 DIAGNOSIS — F5102 Adjustment insomnia: Secondary | ICD-10-CM

## 2020-11-30 NOTE — Telephone Encounter (Signed)
Xanax last rx 10/28/20 #20 LOV 04/22/20 RLS No CSC

## 2020-12-15 ENCOUNTER — Encounter: Payer: Self-pay | Admitting: Physician Assistant

## 2020-12-15 DIAGNOSIS — G2581 Restless legs syndrome: Secondary | ICD-10-CM

## 2020-12-16 ENCOUNTER — Ambulatory Visit: Payer: BC Managed Care – PPO | Admitting: Dermatology

## 2020-12-16 ENCOUNTER — Other Ambulatory Visit: Payer: Self-pay | Admitting: Emergency Medicine

## 2020-12-16 DIAGNOSIS — F5102 Adjustment insomnia: Secondary | ICD-10-CM

## 2020-12-16 NOTE — Telephone Encounter (Signed)
Xanax last rx 10/28/20 #20 LOV: 04/22/20 for RLS

## 2020-12-17 MED ORDER — ALPRAZOLAM 0.25 MG PO TABS
ORAL_TABLET | ORAL | 0 refills | Status: DC
Start: 2020-12-17 — End: 2021-01-31

## 2020-12-18 ENCOUNTER — Ambulatory Visit: Payer: BC Managed Care – PPO | Admitting: Physician Assistant

## 2020-12-18 MED ORDER — GABAPENTIN 300 MG PO CAPS: 600.0000 mg | ORAL_CAPSULE | Freq: Every day | ORAL | 1 refills | Status: DC

## 2020-12-22 ENCOUNTER — Other Ambulatory Visit: Payer: Self-pay | Admitting: Physician Assistant

## 2021-01-07 ENCOUNTER — Encounter: Payer: Self-pay | Admitting: Physician Assistant

## 2021-01-11 ENCOUNTER — Telehealth (INDEPENDENT_AMBULATORY_CARE_PROVIDER_SITE_OTHER): Payer: BC Managed Care – PPO | Admitting: Family Medicine

## 2021-01-11 ENCOUNTER — Encounter: Payer: Self-pay | Admitting: Family Medicine

## 2021-01-11 ENCOUNTER — Other Ambulatory Visit: Payer: Self-pay

## 2021-01-11 DIAGNOSIS — F419 Anxiety disorder, unspecified: Secondary | ICD-10-CM | POA: Insufficient documentation

## 2021-01-11 DIAGNOSIS — M545 Low back pain, unspecified: Secondary | ICD-10-CM | POA: Diagnosis not present

## 2021-01-11 MED ORDER — METHOCARBAMOL 500 MG PO TABS: 500.0000 mg | ORAL_TABLET | Freq: Three times a day (TID) | ORAL | 0 refills | Status: DC | PRN

## 2021-01-11 MED ORDER — PREDNISONE 10 MG PO TABS: ORAL_TABLET | ORAL | 0 refills | Status: DC

## 2021-01-11 NOTE — Progress Notes (Signed)
I connected with  Francisco Pena on 01/11/21 by a video enabled telemedicine application and verified that I am speaking with the correct person using two identifiers.   I discussed the limitations of evaluation and management by telemedicine. The patient expressed understanding and agreed to proceed.

## 2021-01-11 NOTE — Progress Notes (Signed)
Virtual Visit via Video   I connected with patient on 01/11/21 at  1:30 PM EDT by a video enabled telemedicine application and verified that I am speaking with the correct person using two identifiers.  Location patient: Home Location provider: [facility] Office Persons participating in the virtual visit: Patient, Provider, [place] (Sabrina M)  I discussed the limitations of evaluation and management by telemedicine and the availability of in person appointments. The patient expressed understanding and agreed to proceed.  Subjective:   HPI:   Back pain- 'I've had back issues forever'.  S/p surgery in 2017 w/ Dr Ellene Route.  Sxs returned ~2 weeks ago and described as a 'nagging'.  Pain is L sided around L4-L5.  No radiation of pain.  Pain is most noticeable w/ position changes.  Has taken ibuprofen w/o relief, started Celebrex yesterday.  In the past pt has used muscle relaxers.  Alternating heat and ice.  No bowel or bladder incontinence.  No numbness/tingling.  ROS:   See pertinent positives and negatives per HPI.  Patient Active Problem List   Diagnosis Date Noted  . Anxiety 01/11/2021  . GERD (gastroesophageal reflux disease) 02/04/2020  . Body mass index (BMI) 28.0-28.9, adult 11/15/2019  . Essential (primary) hypertension 11/15/2019  . Visit for preventive health examination 06/12/2018  . Prostate cancer screening 06/12/2018  . Obstructive sleep apnea 03/02/2017  . Palpitations 03/02/2017  . Restless leg syndrome 11/14/2016  . Displacement of lumbar intervertebral disc without myelopathy 05/19/2016  . Generalized anxiety disorder 02/24/2015  . Hyperlipidemia LDL goal <130 02/24/2015  . Hypertriglyceridemia 02/24/2015    Social History   Tobacco Use  . Smoking status: Never Smoker  . Smokeless tobacco: Never Used  Substance Use Topics  . Alcohol use: Yes    Comment: occ beer    Current Outpatient Medications:  .  ALPRAZolam (XANAX) 0.25 MG tablet, 1 TAB AT  BEDTIME AS NEEDED FOR SLEEP. MAY TAKE AN ADDITIONAL TAB IN THE DAY FOR SEVERE ACUTE ANXIETY, Disp: 20 tablet, Rfl: 0 .  atorvastatin (LIPITOR) 20 MG tablet, TAKE 1 TABLET BY MOUTH DAILY AT 6 PM., Disp: 90 tablet, Rfl: 1 .  celecoxib (CELEBREX) 200 MG capsule, Take 200 mg by mouth daily., Disp: , Rfl:  .  DEXILANT 60 MG capsule, Take 1 capsule by mouth every morning., Disp: , Rfl:  .  gabapentin (NEURONTIN) 300 MG capsule, Take 2 capsules (600 mg total) by mouth at bedtime., Disp: 60 capsule, Rfl: 1 .  metoprolol succinate (TOPROL-XL) 25 MG 24 hr tablet, TAKE 1/2 TABLET BY MOUTH EVERY DAY, Disp: 45 tablet, Rfl: 3 .  Multiple Vitamin (MULTIVITAMIN) tablet, Take 1 tablet by mouth daily., Disp: , Rfl:  .  buPROPion (WELLBUTRIN XL) 150 MG 24 hr tablet, TAKE 1 TABLET BY MOUTH EVERY DAY (Patient not taking: Reported on 01/11/2021), Disp: 90 tablet, Rfl: 1 .  rOPINIRole (REQUIP) 0.5 MG tablet, Take 2 tablets (1 mg total) by mouth daily. Further refills will need to come from new provider (Patient not taking: Reported on 01/11/2021), Disp: 180 tablet, Rfl: 0 .  sertraline (ZOLOFT) 100 MG tablet, TAKE 1 AND 1/2 TABLETS BY MOUTH EVERY DAY (Patient not taking: Reported on 01/11/2021), Disp: 135 tablet, Rfl: 0  Allergies  Allergen Reactions  . Augmentin [Amoxicillin-Pot Clavulanate] Diarrhea    Objective:   There were no vitals taken for this visit. AAOx3, NAD NCAT, EOMI No obvious CN deficits Coloring WNL Pt is able to speak clearly, coherently without shortness of breath  or increased work of breathing.  Thought process is linear.  Mood is appropriate.   Assessment and Plan:   Lumbar back pain- new to provider, recurrent issue for pt.  Sxs started 2 weeks ago and are 'hindering me'.  Pain is worse when changing positions.  No radiation of pain.  Will start Prednisone taper- pt instructed to hold Celebrex and avoid OTC NSAIDs.  Will start Robaxin prn muscle spasm.  Pt to use heating pad prn.  Reviewed  supportive care and red flags that should prompt return.  Pt expressed understanding and is in agreement w/ plan.    Annye Asa, MD 01/11/2021

## 2021-01-19 ENCOUNTER — Other Ambulatory Visit: Payer: Self-pay | Admitting: Physician Assistant

## 2021-01-31 ENCOUNTER — Other Ambulatory Visit: Payer: Self-pay | Admitting: Family

## 2021-01-31 DIAGNOSIS — F5102 Adjustment insomnia: Secondary | ICD-10-CM

## 2021-03-11 ENCOUNTER — Encounter: Payer: Self-pay | Admitting: Registered Nurse

## 2021-03-11 ENCOUNTER — Other Ambulatory Visit: Payer: Self-pay

## 2021-03-11 ENCOUNTER — Telehealth (INDEPENDENT_AMBULATORY_CARE_PROVIDER_SITE_OTHER): Payer: BC Managed Care – PPO | Admitting: Registered Nurse

## 2021-03-11 DIAGNOSIS — J22 Unspecified acute lower respiratory infection: Secondary | ICD-10-CM

## 2021-03-11 MED ORDER — ALBUTEROL SULFATE HFA 108 (90 BASE) MCG/ACT IN AERS: 2.0000 | INHALATION_SPRAY | Freq: Four times a day (QID) | RESPIRATORY_TRACT | 0 refills | Status: DC | PRN

## 2021-03-11 MED ORDER — LEVOFLOXACIN 500 MG PO TABS
500.0000 mg | ORAL_TABLET | Freq: Every day | ORAL | 0 refills | Status: AC
Start: 2021-03-11 — End: 2021-03-18

## 2021-03-11 MED ORDER — PREDNISONE 10 MG (21) PO TBPK: ORAL_TABLET | ORAL | 0 refills | Status: DC

## 2021-03-11 MED ORDER — HYDROCODONE BIT-HOMATROP MBR 5-1.5 MG/5ML PO SOLN: 5.0000 mL | Freq: Every evening | ORAL | 0 refills | Status: DC | PRN

## 2021-03-11 NOTE — Patient Instructions (Signed)
° ° ° °  If you have lab work done today you will be contacted with your lab results within the next 2 weeks.  If you have not heard from us then please contact us. The fastest way to get your results is to register for My Chart. ° ° °IF you received an x-ray today, you will receive an invoice from [facility]. Please contact [facility] at [phone] with questions or concerns regarding your invoice.  ° °IF you received labwork today, you will receive an invoice from [facility]. Please contact [facility] at [phone] with questions or concerns regarding your invoice.  ° °Our billing staff will not be able to assist you with questions regarding bills from these companies. ° °You will be contacted with the lab results as soon as they are available. The fastest way to get your results is to activate your My Chart account. Instructions are located on the last page of this paperwork. If you have not heard from us regarding the results in 2 weeks, please contact this office. °  ° ° ° °

## 2021-03-11 NOTE — Progress Notes (Signed)
Telemedicine Encounter- SOAP NOTE Established Patient  This telephone encounter was conducted with the patient's (or proxy's) verbal consent via audio telecommunications: yes  Patient was instructed to have this encounter in a suitably private space; and to only have persons present to whom they give permission to participate. In addition, patient identity was confirmed by use of name plus two identifiers (DOB and address).  I discussed the limitations, risks, security and privacy concerns of performing an evaluation and management service by telephone and the availability of in person appointments. I also discussed with the patient that there may be a patient responsible charge related to this service. The patient expressed understanding and agreed to proceed.  I spent a total of 15 minutes talking with the patient or their proxy.  Patient at home Provider in office  Participants: Kathrin Ruddy, NP and Raelene Bott  Chief Complaint  Patient presents with  . Cough    Patient states he has been experiencing a bad cough for a couple of weeks ago. Patient states he went to an urgent care and was prescribed antibiotics and still experiencing some coughing up green phlegm.     Subjective   Francisco Pena is a 55 y.o. established patient. Telephone visit today for cough  HPI Onset late April Seen at urgent care on 02/28/21. Given course of doxycycline. Completed without skipped or missed doses. Noted that symptoms were a little better for a couple of days but now they are at their worst. Nonsmoker, no allergy history. No known sick contacts or exposure to covid. Has been vaccinated x3. Denies fevers, chills, fatigue, shob, doe, headache, weakness, nvd  Patient Active Problem List   Diagnosis Date Noted  . Anxiety 01/11/2021  . GERD (gastroesophageal reflux disease) 02/04/2020  . Body mass index (BMI) 28.0-28.9, adult 11/15/2019  . Essential (primary) hypertension 11/15/2019  . Visit for  preventive health examination 06/12/2018  . Prostate cancer screening 06/12/2018  . Obstructive sleep apnea 03/02/2017  . Palpitations 03/02/2017  . Restless leg syndrome 11/14/2016  . Displacement of lumbar intervertebral disc without myelopathy 05/19/2016  . Generalized anxiety disorder 02/24/2015  . Hyperlipidemia LDL goal <130 02/24/2015  . Hypertriglyceridemia 02/24/2015    Past Medical History:  Diagnosis Date  . Allergy   . Anxiety and depression   . Atypical mole 06/11/2010   Lower Mid Back (mild to moderate)  . Atypical mole 06/11/2010   Right Abdomen (mild)  . Heart palpitations   . Hyperlipidemia   . Prostate infection   . Sleep apnea    CPAP     Current Outpatient Medications  Medication Sig Dispense Refill  . albuterol (VENTOLIN HFA) 108 (90 Base) MCG/ACT inhaler Inhale 2 puffs into the lungs every 6 (six) hours as needed for wheezing or shortness of breath. 8 g 0  . ALPRAZolam (XANAX) 0.25 MG tablet 1 TAB AT BEDTIME AS NEEDED FOR SLEEP. MAY TAKE AN ADDITIONAL TAB IN THE DAY FOR SEVERE ACUTE ANXIETY 20 tablet 0  . atorvastatin (LIPITOR) 20 MG tablet TAKE 1 TABLET BY MOUTH DAILY AT 6 PM. 90 tablet 1  . celecoxib (CELEBREX) 200 MG capsule Take 200 mg by mouth daily.    Marland Kitchen DEXILANT 60 MG capsule Take 1 capsule by mouth every morning.    . gabapentin (NEURONTIN) 300 MG capsule Take 2 capsules (600 mg total) by mouth at bedtime. 60 capsule 1  . HYDROcodone bit-homatropine (HYCODAN) 5-1.5 MG/5ML syrup Take 5 mLs by mouth at bedtime as needed  for cough. 120 mL 0  . levofloxacin (LEVAQUIN) 500 MG tablet Take 1 tablet (500 mg total) by mouth daily for 7 days. 7 tablet 0  . metoprolol succinate (TOPROL-XL) 25 MG 24 hr tablet TAKE 1/2 TABLET BY MOUTH EVERY DAY 45 tablet 3  . Multiple Vitamin (MULTIVITAMIN) tablet Take 1 tablet by mouth daily.    . predniSONE (STERAPRED UNI-PAK 21 TAB) 10 MG (21) TBPK tablet Take per package instructions. Do not skip doses. Finish entire supply.  1 each 0  . buPROPion (WELLBUTRIN XL) 150 MG 24 hr tablet TAKE 1 TABLET BY MOUTH EVERY DAY (Patient not taking: No sig reported) 90 tablet 1  . methocarbamol (ROBAXIN) 500 MG tablet Take 1 tablet (500 mg total) by mouth every 8 (eight) hours as needed for muscle spasms. (Patient not taking: Reported on 03/11/2021) 30 tablet 0  . rOPINIRole (REQUIP) 0.5 MG tablet Take 2 tablets (1 mg total) by mouth daily. Further refills will need to come from new provider (Patient not taking: No sig reported) 180 tablet 0  . sertraline (ZOLOFT) 100 MG tablet TAKE 1 AND 1/2 TABLETS BY MOUTH EVERY DAY (Patient not taking: No sig reported) 135 tablet 0   No current facility-administered medications for this visit.    Allergies  Allergen Reactions  . Augmentin [Amoxicillin-Pot Clavulanate] Diarrhea    Social History   Socioeconomic History  . Marital status: Married    Spouse name: Not on file  . Number of children: 4  . Years of education: Not on file  . Highest education level: Not on file  Occupational History  . Occupation: Pharmacist, hospital  Tobacco Use  . Smoking status: Never Smoker  . Smokeless tobacco: Never Used  Vaping Use  . Vaping Use: Never used  Substance and Sexual Activity  . Alcohol use: Yes    Comment: occ beer  . Drug use: No  . Sexual activity: Yes    Partners: Female  Other Topics Concern  . 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   Social Determinants of Health   Financial Resource Strain: Not on file  Food Insecurity: Not on file  Transportation Needs: Not on file  Physical Activity: Not on file  Stress: Not on file  Social Connections: Not on file  Intimate Partner Violence: Not on file    ROS Per hpi   Objective   Vitals as reported by the patient: There were no vitals filed for this visit.  Francisco Pena was seen today for cough.  Diagnoses and  all orders for this visit:  Lower respiratory infection -     levofloxacin (LEVAQUIN) 500 MG tablet; Take 1 tablet (500 mg total) by mouth daily for 7 days. -     predniSONE (STERAPRED UNI-PAK 21 TAB) 10 MG (21) TBPK tablet; Take per package instructions. Do not skip doses. Finish entire supply. -     HYDROcodone bit-homatropine (HYCODAN) 5-1.5 MG/5ML syrup; Take 5 mLs by mouth at bedtime as needed for cough. -     albuterol (VENTOLIN HFA) 108 (90 Base) MCG/ACT inhaler; Inhale 2 puffs into the lungs every 6 (six) hours as needed for wheezing or shortness of breath.    PLAN  Ongoing lower respiratory infection  Suggest covid testing at home to rule out, though low suspicion given course  Ok to work but suggest routine precautions: mask,  handwashing, distancing when possible  Return if worsening or failing to improve. Can consider cxr or pulm referral.  Patient encouraged to call clinic with any questions, comments, or concerns.  I discussed the assessment and treatment plan with the patient. The patient was provided an opportunity to ask questions and all were answered. The patient agreed with the plan and demonstrated an understanding of the instructions.   The patient was advised to call back or seek an in-person evaluation if the symptoms worsen or if the condition fails to improve as anticipated.  I provided 16 minutes of non-face-to-face time during this encounter.  [name], NP  Primary Care at [facility]

## 2021-03-15 ENCOUNTER — Other Ambulatory Visit: Payer: Self-pay | Admitting: Registered Nurse

## 2021-03-15 DIAGNOSIS — J22 Unspecified acute lower respiratory infection: Secondary | ICD-10-CM

## 2021-03-18 ENCOUNTER — Telehealth: Payer: Self-pay | Admitting: Internal Medicine

## 2021-03-18 DIAGNOSIS — G4733 Obstructive sleep apnea (adult) (pediatric): Secondary | ICD-10-CM

## 2021-03-18 NOTE — Telephone Encounter (Signed)
Called and spoke with pt and he stated that he has been using the cpap for a while and it may be time for a new machine.  He stated that the cpap will ramp up but when it gets to the full setting he stated that it feels like it is too much air.  He stated that he cannot sleep with the cpap blowing that much air.  He is wanting to know if the settings can be adjusted?  CY please advise. Thanks   Last ov--04/27/20 with BPM Next ov--no pending appts

## 2021-03-19 ENCOUNTER — Encounter: Payer: Self-pay | Admitting: Registered Nurse

## 2021-03-19 NOTE — Telephone Encounter (Signed)
Order- ask DME to change pressure to auto 4-12 and please ask them when he will be eligible for replacement machine.  Please arrange f/u ov with me this summer for 1 year f/u.

## 2021-03-19 NOTE — Telephone Encounter (Signed)
I called and spoke with patient regarding Dr. Thomes Cake. I sent in DME order to [facility] to change pressure and to see if patient can get a new machine. Patient verbalized understanding, nothing further needed.

## 2021-03-22 ENCOUNTER — Other Ambulatory Visit: Payer: Self-pay

## 2021-03-22 DIAGNOSIS — F5102 Adjustment insomnia: Secondary | ICD-10-CM

## 2021-03-22 MED ORDER — ALPRAZOLAM 0.25 MG PO TABS: 0.2500 mg | ORAL_TABLET | Freq: Two times a day (BID) | ORAL | 0 refills | Status: DC | PRN

## 2021-03-22 NOTE — Telephone Encounter (Signed)
LFD 01/31/21 #20 with no refills LOV 03/11/21 NOV 06/15/21

## 2021-04-05 ENCOUNTER — Telehealth: Payer: Self-pay | Admitting: Physician Assistant

## 2021-04-05 ENCOUNTER — Other Ambulatory Visit: Payer: Self-pay

## 2021-04-05 DIAGNOSIS — F419 Anxiety disorder, unspecified: Secondary | ICD-10-CM

## 2021-04-05 IMAGING — DX DG CHEST 2V
2 series · 2 of 2 positions shown · non-contrast
Comparison: None.

CLINICAL DATA: Night sweats.

EXAM:
CHEST - 2 VIEW

[chest pa]
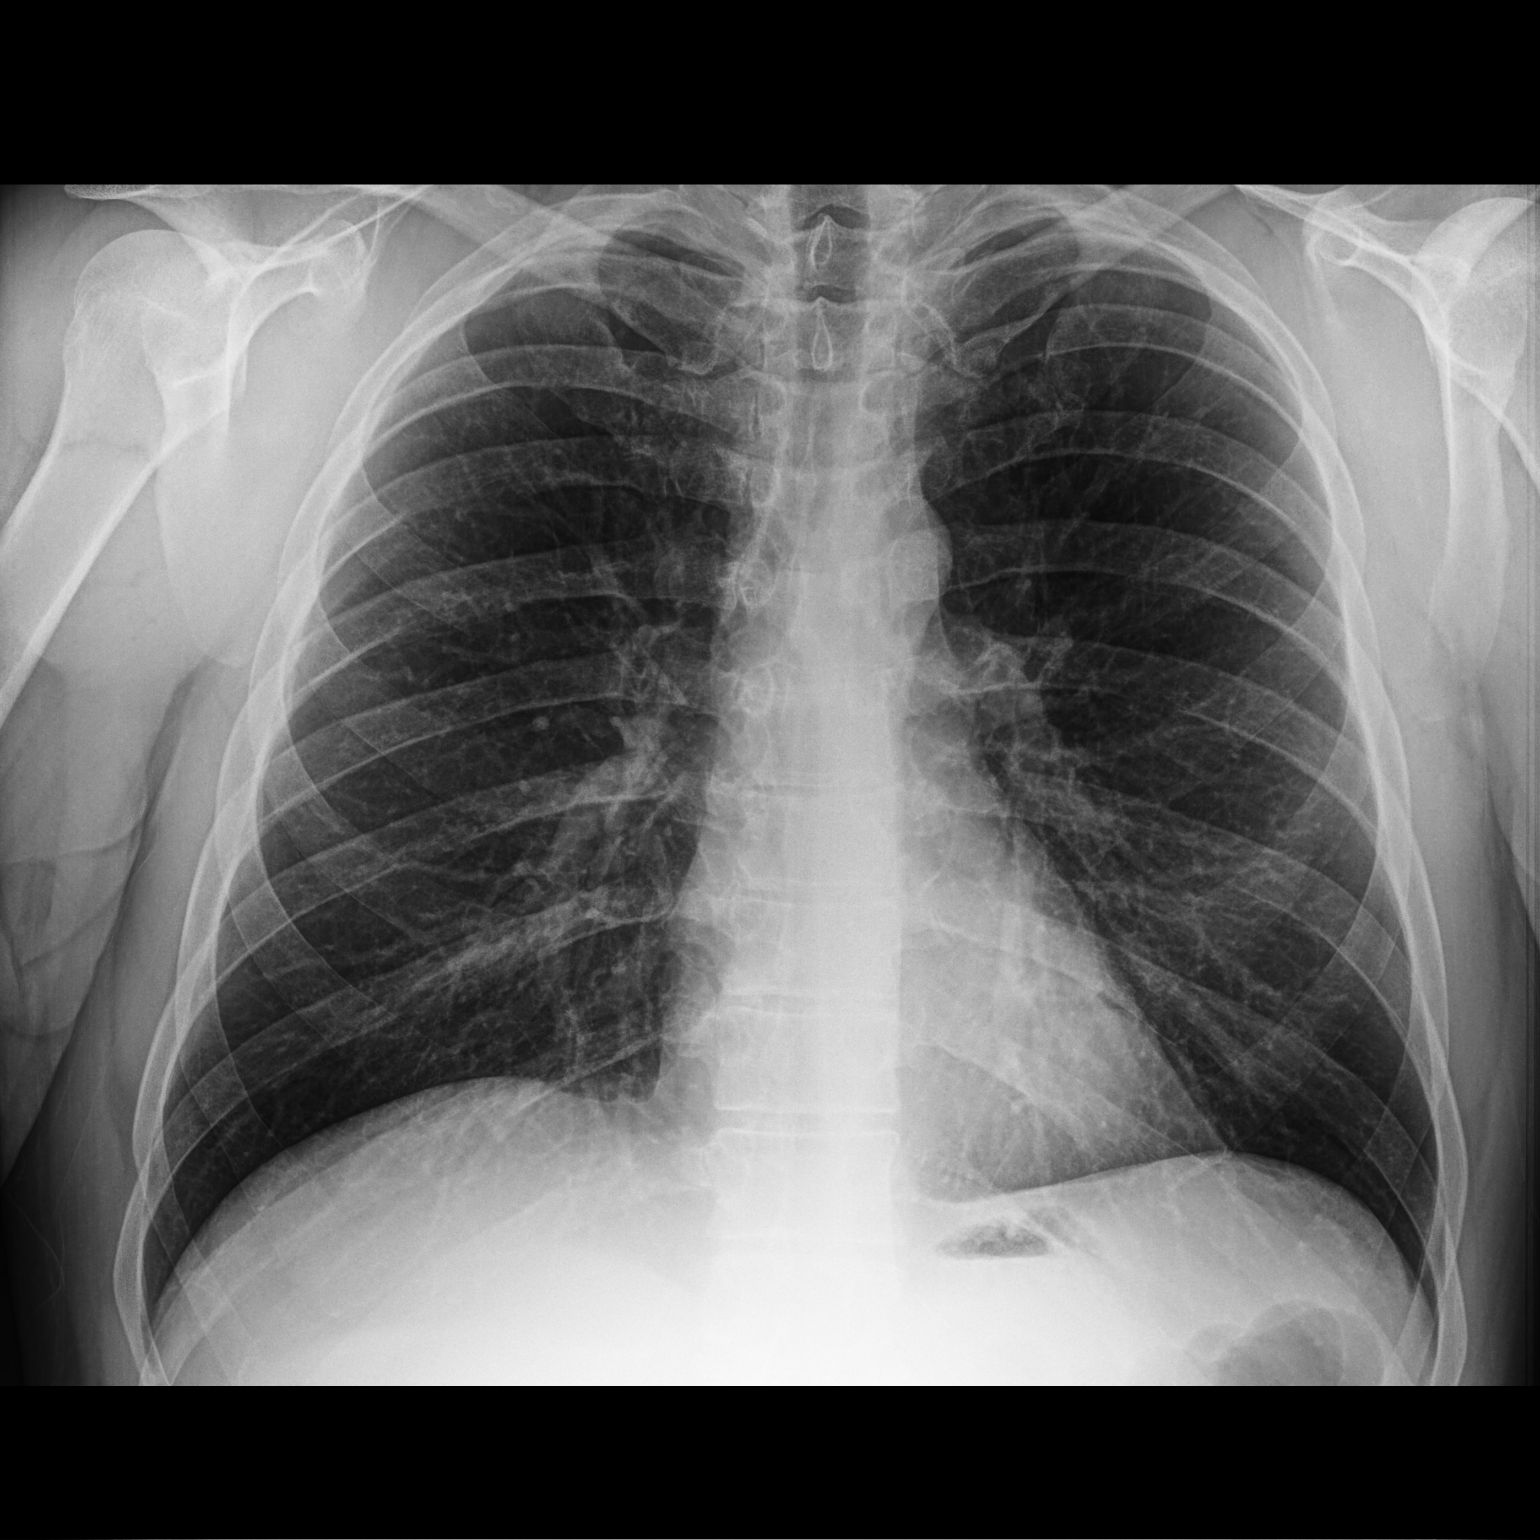

[chest lat]
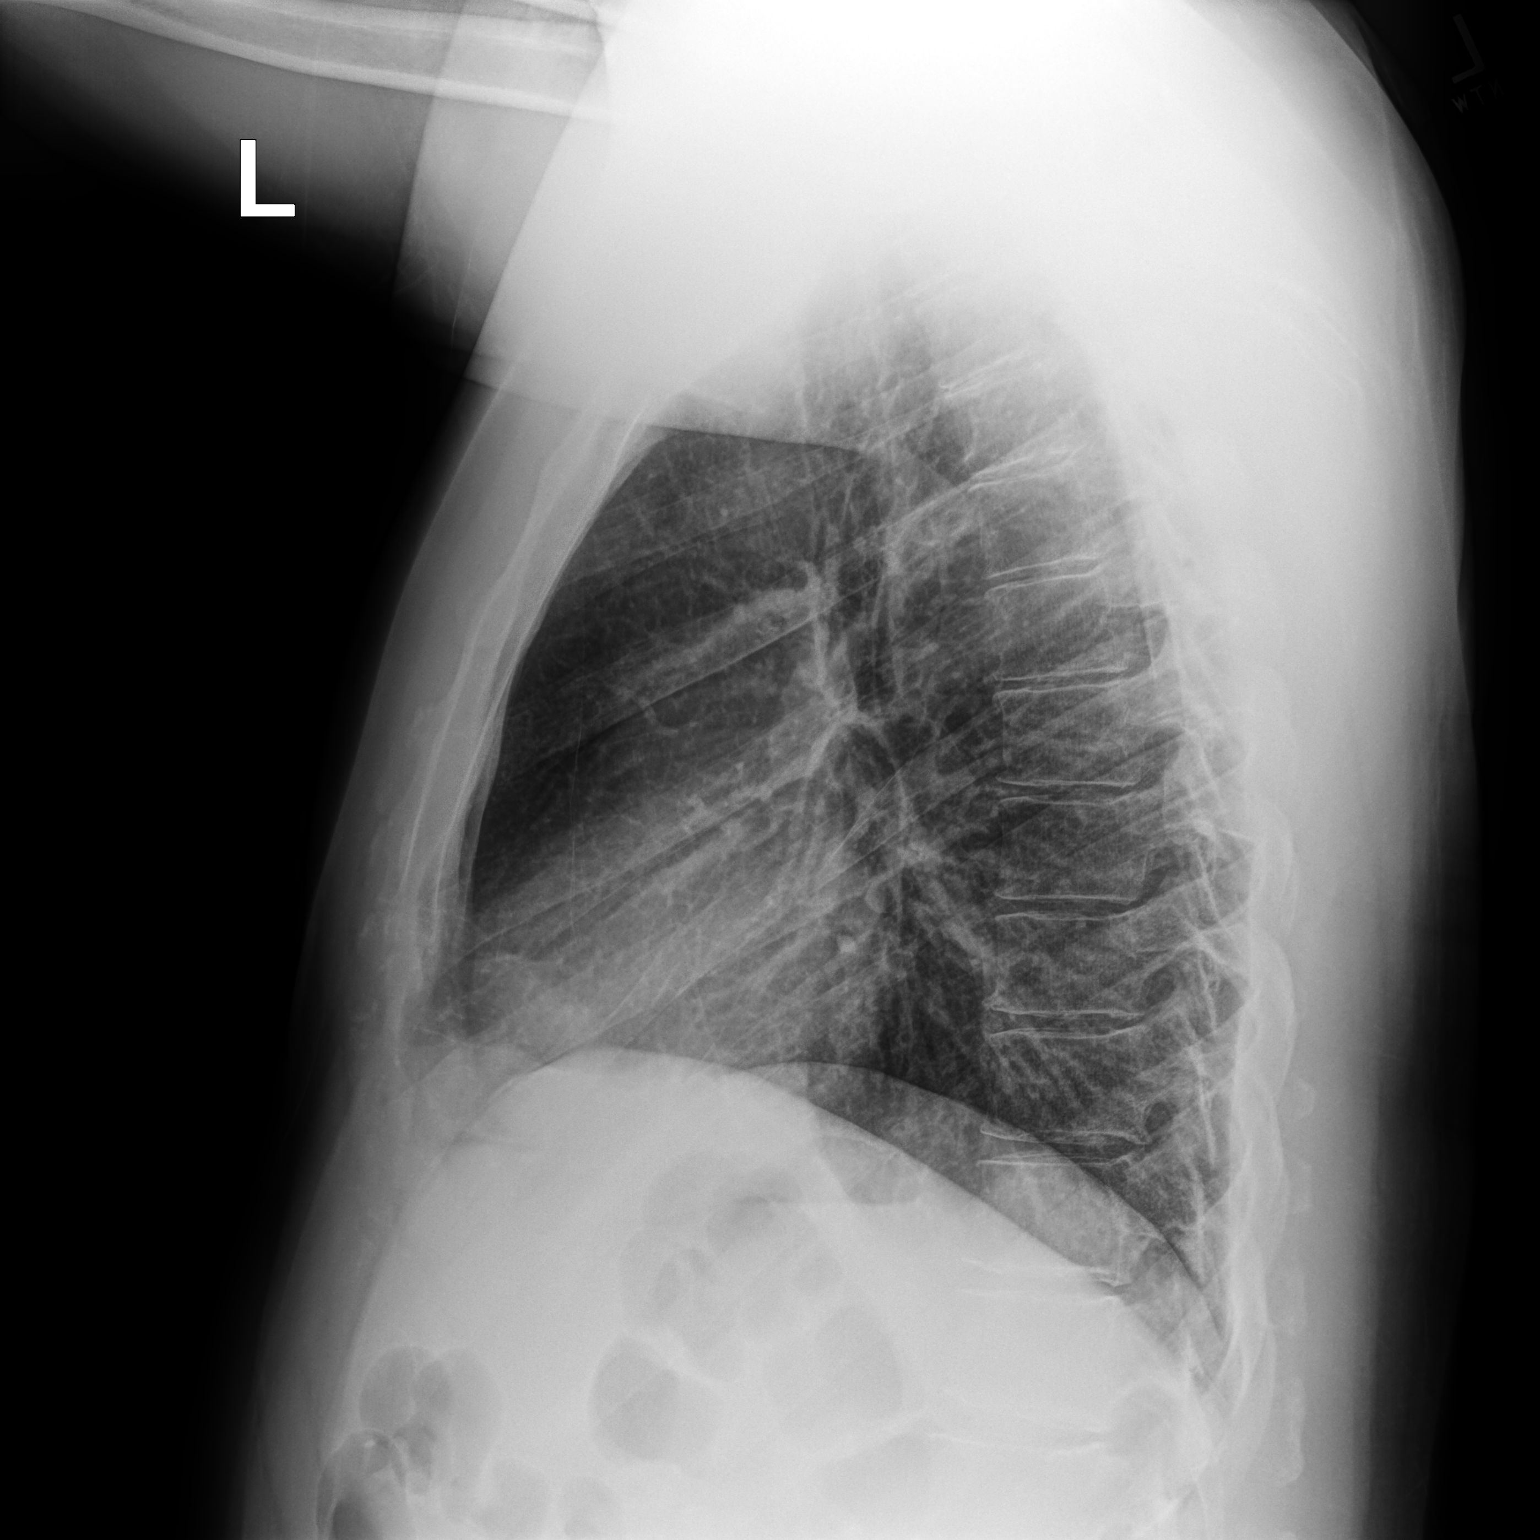

[2 of 2 positions shown; findings below may reference images not displayed]

FINDINGS: The heart size and mediastinal contours are within normal limits.
Both lungs are clear. The visualized skeletal structures are
unremarkable.
IMPRESSION: No active cardiopulmonary disease.

## 2021-04-05 MED ORDER — SERTRALINE HCL 100 MG PO TABS: 1.5000 | ORAL_TABLET | Freq: Every day | ORAL | 0 refills | Status: DC

## 2021-04-05 NOTE — Telephone Encounter (Signed)
..  Medication Refills  Last OV:  Medication:  S[facility] Pharmacy:  [facility] [address] in [place]  Let patient know to contact pharmacy at the end of the day to make sure medication is ready.   Please notify patient to allow 48-72 hours to process.  Encourage patient to contact the pharmacy for refills or they can request refills through [place] out below:   Last refill:  QTY:  Refill Date:    Other Comments:  Patient has a transfer of care appt. With Rich.   Okay for refill?  Please advise.

## 2021-04-05 NOTE — Telephone Encounter (Signed)
Rx has been filled and sent to patient pharmacy.

## 2021-04-26 ENCOUNTER — Ambulatory Visit: Payer: Self-pay | Admitting: Internal Medicine

## 2021-04-28 ENCOUNTER — Other Ambulatory Visit: Payer: Self-pay | Admitting: Registered Nurse

## 2021-04-28 DIAGNOSIS — F5102 Adjustment insomnia: Secondary | ICD-10-CM

## 2021-04-29 NOTE — Telephone Encounter (Signed)
LFD 03/22/21 #20 with no refills LOV 03/11/21 NOV 06/15/21 TOC

## 2021-05-04 ENCOUNTER — Other Ambulatory Visit: Payer: Self-pay

## 2021-05-04 DIAGNOSIS — G2581 Restless legs syndrome: Secondary | ICD-10-CM

## 2021-05-04 MED ORDER — GABAPENTIN 300 MG PO CAPS: 600.0000 mg | ORAL_CAPSULE | Freq: Every day | ORAL | 1 refills | Status: DC

## 2021-05-23 ENCOUNTER — Other Ambulatory Visit: Payer: Self-pay | Admitting: Registered Nurse

## 2021-05-23 DIAGNOSIS — F5102 Adjustment insomnia: Secondary | ICD-10-CM

## 2021-05-23 NOTE — Progress Notes (Signed)
03/02/17-55 year old male never smoker referred courtesy of Dr Raiford Noble; never had sleep study. Pt woke himself up snoring. Wife tells him he snores loudly, witnessed apneas. Admits to daytime tiredness. Bedtime 9:30 PM, short sleep latency, waking 2 or 3 times before up at 6:30 AM. No sleep medications. Occasional hydrocodone for pain. Residual discomfort from shoulder surgery limits sleep positions. 2 cups of coffee in the morning. ENT surgery-tonsils and adenoids. Cautery for epistaxis. Treated for atrial flutter. Not aware of lung disease. Does have seasonal allergic rhinitis. He has had a diagnosis of restless legs but is aware only now of shifting sleep positions and is not on specific therapy.  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 Medical problem list  HTN, GERD, Restless Legs, Hyperlipidemia, Anxiety,  HST 03/29/17- AHI 7.5/ hr, desaturation to 81%, body weight 219 lbs CPAP auto 4-12/ [facility]   Replacement machine ordered 03/19/21 Download- compliance 50%, AHI 4.2/ hr Body weight today-226 lbs Covid vax- He reports taking CPAP off in his sleep.  Does sleep better if he can keep it on.  Taking one half of an alprazolam tablet for sleep with benefit.   ROS-see HPI   + = pos Constitutional:    weight loss, night sweats, fevers, chills, + fatigue, lassitude. HEENT:    headaches, difficulty swallowing, tooth/dental problems, sore throat,       sneezing, itching, ear ache, + nasal congestion, post nasal drip, snoring CV:    chest pain, orthopnea, PND, swelling in lower extremities, anasarca,                                                   dizziness, palpitations Resp:   shortness of breath with exertion or at rest.                productive cough,   non-productive cough, coughing up of blood.              change in color of mucus.  wheezing.   Skin:    rash or lesions. GI:  No-   heartburn, indigestion,  abdominal pain, nausea, vomiting, diarrhea,                 change in bowel habits, loss of appetite GU: dysuria, change in color of urine, no urgency or frequency.   flank pain. MS:   + joint pain, stiffness, decreased range of motion, back pain. Neuro-     nothing unusual Psych:  change in mood or affect.  depression or anxiety.   memory loss.  OBJ- Physical Exam General- Alert, Oriented, Affect-appropriate, Distress- none acute, not obese Skin- rash-none, lesions- none, excoriation- none Lymphadenopathy- none Head- atraumatic            Eyes- Gross vision intact, PERRLA, conjunctivae and secretions clear            Ears- Hearing, canals-normal            Nose- Clear, no-Septal dev, mucus, polyps, erosion, perforation             Throat- Mallampati II , mucosa clear , drainage- none, tonsils- atrophic Neck- flexible , trachea midline, no stridor , thyroid nl, carotid no bruit Chest - symmetrical excursion , unlabored  Heart/CV- RRR at this exam , no murmur , no gallop  , no rub, nl s1 s2                           - JVD- none , edema- none, stasis changes- none, varices- none           Lung- clear to P&A, wheeze- none, cough- none , dullness-none, rub- none           Chest wall-  Abd-  Br/ Gen/ Rectal- Not done, not indicated Extrem- cyanosis- none, clubbing, none, atrophy- none, strength- nl Neuro- grossly intact to observation

## 2021-05-24 ENCOUNTER — Ambulatory Visit: Payer: BC Managed Care – PPO | Admitting: Internal Medicine

## 2021-05-24 ENCOUNTER — Encounter: Payer: Self-pay | Admitting: Internal Medicine

## 2021-05-24 ENCOUNTER — Other Ambulatory Visit: Payer: Self-pay

## 2021-05-24 DIAGNOSIS — G4733 Obstructive sleep apnea (adult) (pediatric): Secondary | ICD-10-CM | POA: Diagnosis not present

## 2021-05-24 DIAGNOSIS — F5102 Adjustment insomnia: Secondary | ICD-10-CM

## 2021-05-24 DIAGNOSIS — F5101 Primary insomnia: Secondary | ICD-10-CM

## 2021-05-24 MED ORDER — ALPRAZOLAM 0.25 MG PO TABS
ORAL_TABLET | ORAL | 2 refills | Status: DC
Start: 2021-05-24 — End: 2021-09-22

## 2021-05-24 NOTE — Telephone Encounter (Signed)
LFD 04/29/21 #20 with no refills LOV 03/11/21 NOV 06/15/21 TOC

## 2021-05-24 NOTE — Patient Instructions (Signed)
Order- DME [facility]- please contact patient about status of replacement CPAP machine ordered in May.    Auto 4-12, mask of choice, humidifier, supplies, AirView/ card

## 2021-05-27 ENCOUNTER — Other Ambulatory Visit: Payer: Self-pay

## 2021-05-27 DIAGNOSIS — E785 Hyperlipidemia, unspecified: Secondary | ICD-10-CM

## 2021-05-27 MED ORDER — ATORVASTATIN CALCIUM 20 MG PO TABS: ORAL_TABLET | ORAL | 0 refills | Status: DC

## 2021-06-06 ENCOUNTER — Other Ambulatory Visit: Payer: Self-pay | Admitting: Registered Nurse

## 2021-06-06 DIAGNOSIS — F419 Anxiety disorder, unspecified: Secondary | ICD-10-CM

## 2021-06-11 ENCOUNTER — Telehealth: Payer: Self-pay

## 2021-06-15 ENCOUNTER — Encounter: Payer: Self-pay | Admitting: Registered Nurse

## 2021-06-15 NOTE — Telephone Encounter (Signed)
Error

## 2021-06-16 ENCOUNTER — Other Ambulatory Visit: Payer: Self-pay | Admitting: Registered Nurse

## 2021-06-16 DIAGNOSIS — F419 Anxiety disorder, unspecified: Secondary | ICD-10-CM

## 2021-07-14 ENCOUNTER — Encounter: Payer: Self-pay | Admitting: Registered Nurse

## 2021-07-14 NOTE — Telephone Encounter (Signed)
He works with [facility] which has been very delayed. If they can provide Luna or other alternative machine, then that is fine. Otherwise see if they can expedite his replacement- how long do they anticipate?

## 2021-07-14 NOTE — Telephone Encounter (Signed)
Order for CPAP was placed back in July 2022. Patient stated that he is ok with getting a Luna device.   Dr.Young, are you ok with [place] changing the order to state he can get a Luna device if it means he can get a CPAP machine quicker?

## 2021-07-16 ENCOUNTER — Other Ambulatory Visit: Payer: Self-pay | Admitting: Registered Nurse

## 2021-07-19 DIAGNOSIS — G4733 Obstructive sleep apnea (adult) (pediatric): Secondary | ICD-10-CM

## 2021-08-31 ENCOUNTER — Other Ambulatory Visit: Payer: Self-pay

## 2021-08-31 ENCOUNTER — Telehealth (INDEPENDENT_AMBULATORY_CARE_PROVIDER_SITE_OTHER): Payer: BC Managed Care – PPO | Admitting: Registered Nurse

## 2021-08-31 ENCOUNTER — Encounter: Payer: Self-pay | Admitting: Registered Nurse

## 2021-08-31 DIAGNOSIS — J019 Acute sinusitis, unspecified: Secondary | ICD-10-CM

## 2021-08-31 DIAGNOSIS — B9689 Other specified bacterial agents as the cause of diseases classified elsewhere: Secondary | ICD-10-CM

## 2021-08-31 MED ORDER — AZITHROMYCIN 250 MG PO TABS: ORAL_TABLET | ORAL | 0 refills | Status: AC

## 2021-08-31 MED ORDER — AZELASTINE HCL 0.1 % NA SOLN: 1.0000 | Freq: Two times a day (BID) | NASAL | 12 refills | Status: DC

## 2021-08-31 NOTE — Patient Instructions (Signed)
° ° ° °  If you have lab work done today you will be contacted with your lab results within the next 2 weeks.  If you have not heard from us then please contact us. The fastest way to get your results is to register for My Chart. ° ° °IF you received an x-ray today, you will receive an invoice from [facility]. Please contact [facility] at [phone] with questions or concerns regarding your invoice.  ° °IF you received labwork today, you will receive an invoice from [facility]. Please contact [facility] at [phone] with questions or concerns regarding your invoice.  ° °Our billing staff will not be able to assist you with questions regarding bills from these companies. ° °You will be contacted with the lab results as soon as they are available. The fastest way to get your results is to activate your My Chart account. Instructions are located on the last page of this paperwork. If you have not heard from us regarding the results in 2 weeks, please contact this office. °  ° ° ° °

## 2021-08-31 NOTE — Progress Notes (Signed)
Telemedicine Encounter- SOAP NOTE Established Patient  This telephone encounter was conducted with the patient's (or proxy's) verbal consent via audio telecommunications: yes/no: Yes Patient was instructed to have this encounter in a suitably private space; and to only have persons present to whom they give permission to participate. In addition, patient identity was confirmed by use of name plus two identifiers (DOB and address).  I discussed the limitations, risks, security and privacy concerns of performing an evaluation and management service by telephone and the availability of in person appointments. I also discussed with the patient that there may be a patient responsible charge related to this service. The patient expressed understanding and agreed to proceed.  I spent a total of 14 minutes talking with the patient or their proxy.  Patient at home Provider in office  Participants: Kathrin Ruddy, NP and [name]  Chief Complaint  Patient presents with   Sinusitis    Patient states he has a sinus infection for about a week. He is experiencing Pressure , nasal congestion , headache . He has took some OTC medication with little to no relief.    Subjective   Francisco Pena is a 55 y.o. established patient. Telephone visit today for sinus infection  HPI Ongoing a little over 1 week. Sinus pressure, frontal headache, nasal congestion. No sick contacts Has not taken home covid test - had covid in august, has been vaccinated and boosted. Denies nvd, shob, wheezing, chest pain, visual changes, loc  OTC remedies without relief - generic cold and flu.  Patient Active Problem List   Diagnosis Date Noted   Anxiety 01/11/2021   GERD (gastroesophageal reflux disease) 02/04/2020   Body mass index (BMI) 28.0-28.9, adult 11/15/2019   Essential (primary) hypertension 11/15/2019   Visit for preventive health examination 06/12/2018   Prostate cancer screening 06/12/2018   Obstructive  sleep apnea 03/02/2017   Palpitations 03/02/2017   Restless leg syndrome 11/14/2016   Displacement of lumbar intervertebral disc without myelopathy 05/19/2016   Generalized anxiety disorder 02/24/2015   Hyperlipidemia LDL goal <130 02/24/2015   Hypertriglyceridemia 02/24/2015    Past Medical History:  Diagnosis Date   Allergy    Anxiety and depression    Atypical mole 06/11/2010   Lower Mid Back (mild to moderate)   Atypical mole 06/11/2010   Right Abdomen (mild)   Heart palpitations    Hyperlipidemia    Prostate infection    Sleep apnea    CPAP     Current Outpatient Medications  Medication Sig Dispense Refill   azelastine (ASTELIN) 0.1 % nasal spray Place 1 spray into both nostrils 2 (two) times daily. Use in each nostril as directed 30 mL 12   azithromycin (ZITHROMAX) 250 MG tablet Take 2 tablets on day 1, then 1 tablet daily on days 2 through 5 6 tablet 0   albuterol (VENTOLIN HFA) 108 (90 Base) MCG/ACT inhaler Inhale 2 puffs into the lungs every 6 (six) hours as needed for wheezing or shortness of breath. 8 g 0   ALPRAZolam (XANAX) 0.25 MG tablet TAKE 1 TABLET BY MOUTH TWICE A DAY AS NEEDED FOR ANXIETY 20 tablet 2   atorvastatin (LIPITOR) 20 MG tablet TAKE 1 TABLET BY MOUTH DAILY AT 6 PM. 90 tablet 0   buPROPion (WELLBUTRIN XL) 150 MG 24 hr tablet TAKE 1 TABLET BY MOUTH EVERY DAY 90 tablet 1   celecoxib (CELEBREX) 200 MG capsule Take 200 mg by mouth daily.     DEXILANT  60 MG capsule Take 1 capsule by mouth every morning.     gabapentin (NEURONTIN) 300 MG capsule Take 2 capsules (600 mg total) by mouth at bedtime. 60 capsule 1   HYDROcodone bit-homatropine (HYCODAN) 5-1.5 MG/5ML syrup Take 5 mLs by mouth at bedtime as needed for cough. 120 mL 0   methocarbamol (ROBAXIN) 500 MG tablet Take 1 tablet (500 mg total) by mouth every 8 (eight) hours as needed for muscle spasms. 30 tablet 0   metoprolol succinate (TOPROL-XL) 25 MG 24 hr tablet TAKE 1/2 TABLET BY MOUTH EVERY DAY 45  tablet 3   Multiple Vitamin (MULTIVITAMIN) tablet Take 1 tablet by mouth daily.     predniSONE (STERAPRED UNI-PAK 21 TAB) 10 MG (21) TBPK tablet Take per package instructions. Do not skip doses. Finish entire supply. 1 each 0   rOPINIRole (REQUIP) 0.5 MG tablet Take 2 tablets (1 mg total) by mouth daily. Further refills will need to come from new provider 180 tablet 0   sertraline (ZOLOFT) 100 MG tablet TAKE 1.5 TABLETS (150MG  TOTAL) BY MOUTH DAILY 135 tablet 1   No current facility-administered medications for this visit.    Allergies  Allergen Reactions   Augmentin [Amoxicillin-Pot Clavulanate] Diarrhea    Social History   Socioeconomic History   Marital status: Married    Spouse name: Not on file   Number of children: 4   Years of education: Not on file   Highest education level: Not on file  Occupational History   Occupation: Teacher  Tobacco Use   Smoking status: Never   Smokeless tobacco: Never  Vaping Use   Vaping Use: Never used  Substance and Sexual Activity   Alcohol use: Yes    Comment: occ beer   Drug use: No   Sexual activity: Yes    Partners: Female  Other Topics Concern   Not on file  Social History Narrative   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   Social Determinants of Health   Financial Resource Strain: Not on file  Food Insecurity: Not on file  Transportation Needs: Not on file  Physical Activity: Not on file  Stress: Not on file  Social Connections: Not on file  Intimate Partner Violence: Not on file    Review of Systems  Constitutional: Negative.  Negative for chills, diaphoresis, fever, malaise/fatigue and weight loss.  HENT:  Positive for congestion and sinus pain. Negative for ear discharge, ear pain, hearing loss, nosebleeds, sore throat and tinnitus.   Eyes: Negative.   Respiratory: Negative.  Negative for stridor.   Cardiovascular: Negative.    Gastrointestinal: Negative.   Genitourinary: Negative.   Musculoskeletal: Negative.   Skin: Negative.   Neurological: Negative.   Endo/Heme/Allergies: Negative.   Psychiatric/Behavioral: Negative.    All other systems reviewed and are negative.  Objective   Vitals as reported by the patient: There were no vitals filed for this visit.  Trayquan was seen today for sinusitis.  Diagnoses and all orders for this visit:  Acute bacterial sinusitis -     azithromycin (ZITHROMAX) 250 MG tablet; Take 2 tablets on day 1, then 1 tablet daily on days 2 through 5 -     azelastine (ASTELIN) 0.1 % nasal spray; Place 1 spray into both nostrils 2 (two) times daily. Use in each nostril as directed   PLAN Given week+ course will tx as bacterial with z pack  and azelastine Return precautions reviewed Nonpharm and OTC relief reviewed Patient encouraged to call clinic with any questions, comments, or concerns.  I discussed the assessment and treatment plan with the patient. The patient was provided an opportunity to ask questions and all were answered. The patient agreed with the plan and demonstrated an understanding of the instructions.   The patient was advised to call back or seek an in-person evaluation if the symptoms worsen or if the condition fails to improve as anticipated.  I provided 15 minutes of non-face-to-face time during this encounter.  [name], NP

## 2021-09-22 ENCOUNTER — Encounter: Payer: Self-pay | Admitting: Registered Nurse

## 2021-09-22 ENCOUNTER — Ambulatory Visit: Payer: BC Managed Care – PPO | Admitting: Registered Nurse

## 2021-09-22 ENCOUNTER — Other Ambulatory Visit: Payer: Self-pay

## 2021-09-22 VITALS — BP 120/63 | HR 60 | Temp 98.4°F | Resp 18 | Ht 74.0 in | Wt 224.4 lb

## 2021-09-22 DIAGNOSIS — F5102 Adjustment insomnia: Secondary | ICD-10-CM

## 2021-09-22 DIAGNOSIS — F419 Anxiety disorder, unspecified: Secondary | ICD-10-CM | POA: Diagnosis not present

## 2021-09-22 DIAGNOSIS — R002 Palpitations: Secondary | ICD-10-CM

## 2021-09-22 DIAGNOSIS — J019 Acute sinusitis, unspecified: Secondary | ICD-10-CM

## 2021-09-22 DIAGNOSIS — Z1329 Encounter for screening for other suspected endocrine disorder: Secondary | ICD-10-CM

## 2021-09-22 DIAGNOSIS — Z130 Encounter for screening for diseases of the blood and blood-forming organs and certain disorders involving the immune mechanism: Secondary | ICD-10-CM

## 2021-09-22 DIAGNOSIS — G2581 Restless legs syndrome: Secondary | ICD-10-CM

## 2021-09-22 DIAGNOSIS — B9689 Other specified bacterial agents as the cause of diseases classified elsewhere: Secondary | ICD-10-CM

## 2021-09-22 DIAGNOSIS — E785 Hyperlipidemia, unspecified: Secondary | ICD-10-CM

## 2021-09-22 DIAGNOSIS — Z13228 Encounter for screening for other metabolic disorders: Secondary | ICD-10-CM

## 2021-09-22 LAB — COMPREHENSIVE METABOLIC PANEL
ALT: 24 U/L (ref 0–53)
AST: 28 U/L (ref 0–37)
Albumin: 4.4 g/dL (ref 3.5–5.2)
Alkaline Phosphatase: 69 U/L (ref 39–117)
BUN: 18 mg/dL (ref 6–23)
CO2: 29 mEq/L (ref 19–32)
Calcium: 9.4 mg/dL (ref 8.4–10.5)
Chloride: 105 mEq/L (ref 96–112)
Creatinine, Ser: 1.15 mg/dL (ref 0.40–1.50)
GFR: 72.76 mL/min (ref >60.00)
Glucose, Bld: 89 mg/dL (ref 70–99)
Potassium: 4.3 mEq/L (ref 3.5–5.1)
Sodium: 141 mEq/L (ref 135–145)
Total Bilirubin: 1.5 mg/dL — ABNORMAL HIGH (ref 0.2–1.2)
Total Protein: 6.5 g/dL (ref 6.0–8.3)

## 2021-09-22 LAB — LIPID PANEL
Cholesterol: 178 mg/dL (ref 0–200)
HDL: 52.3 mg/dL (ref >39.00)
LDL Cholesterol: 94 mg/dL (ref 0–99)
NonHDL: 125.51
Total CHOL/HDL Ratio: 3
Triglycerides: 160 mg/dL — ABNORMAL HIGH (ref 0.0–149.0)
VLDL: 32 mg/dL (ref 0.0–40.0)

## 2021-09-22 LAB — CBC WITH DIFFERENTIAL/PLATELET
Basophils Absolute: 0 10*3/uL (ref 0.0–0.1)
Basophils Relative: 0.9 % (ref 0.0–3.0)
Eosinophils Absolute: 0.1 10*3/uL (ref 0.0–0.7)
Eosinophils Relative: 2.8 % (ref 0.0–5.0)
HCT: 42.8 % (ref 39.0–52.0)
Hemoglobin: 14.2 g/dL (ref 13.0–17.0)
Lymphocytes Relative: 28.7 % (ref 12.0–46.0)
Lymphs Abs: 1.4 10*3/uL (ref 0.7–4.0)
MCHC: 33.2 g/dL (ref 30.0–36.0)
MCV: 86.1 fl (ref 78.0–100.0)
Monocytes Absolute: 0.5 10*3/uL (ref 0.1–1.0)
Monocytes Relative: 10.3 % (ref 3.0–12.0)
Neutro Abs: 2.9 10*3/uL (ref 1.4–7.7)
Neutrophils Relative %: 57.3 % (ref 43.0–77.0)
Platelets: 176 10*3/uL (ref 150.0–400.0)
RBC: 4.97 Mil/uL (ref 4.22–5.81)
RDW: 13.4 % (ref 11.5–15.5)
WBC: 5 10*3/uL (ref 4.0–10.5)

## 2021-09-22 LAB — HEMOGLOBIN A1C: Hgb A1c MFr Bld: 5.5 % (ref 4.6–6.5)

## 2021-09-22 LAB — TSH: TSH: 0.83 u[IU]/mL (ref 0.35–5.50)

## 2021-09-22 MED ORDER — ALPRAZOLAM 0.25 MG PO TABS: ORAL_TABLET | ORAL | 2 refills | Status: DC

## 2021-09-22 MED ORDER — METOPROLOL SUCCINATE ER 25 MG PO TB24: 12.5000 mg | ORAL_TABLET | Freq: Every day | ORAL | 3 refills | Status: DC

## 2021-09-22 MED ORDER — BUPROPION HCL ER (XL) 150 MG PO TB24: 150.0000 mg | ORAL_TABLET | Freq: Every day | ORAL | 1 refills | Status: DC

## 2021-09-22 MED ORDER — ATORVASTATIN CALCIUM 20 MG PO TABS: ORAL_TABLET | ORAL | 0 refills | Status: DC

## 2021-09-22 MED ORDER — SERTRALINE HCL 100 MG PO TABS: ORAL_TABLET | ORAL | 1 refills | Status: DC

## 2021-09-22 MED ORDER — GABAPENTIN 300 MG PO CAPS: 600.0000 mg | ORAL_CAPSULE | Freq: Every day | ORAL | 1 refills | Status: DC

## 2021-09-22 NOTE — Progress Notes (Signed)
Established Patient Office Visit  Subjective:  Patient ID: Francisco Pena, male    DOB: Jul 30, 1966  Age: 55 y.o. MRN: [id number]  CC:  Chief Complaint  Patient presents with   Transitions Of Care    Patient states he is here for a TOC and medication refill    HPI Francisco Pena presents for TOC, med refill  No acute concerns  Histories reviewed and updated with patient.   Hypertension: Patient Currently taking: metoprolol 12.5mg  ER po qd Good effect. No AEs. Denies CV symptoms including: chest pain, shob, doe, headache, visual changes, fatigue, claudication, and dependent edema.   Previous readings and labs: BP Readings from Last 3 Encounters:  09/22/21 120/63  05/24/21 120/80  08/13/20 118/70   Lab Results  Component Value Date   CREATININE 1.18 08/01/2019    HLD Taking atorvastatin 20mg  po qd Good effect, no AE Lab Results  Component Value Date   CHOL 167 04/22/2020   HDL 52.10 04/22/2020   LDLCALC 75 04/22/2020   LDLDIRECT 69.0 05/29/2018   TRIG 198.0 (H) 04/22/2020   CHOLHDL 3 04/22/2020    RLS, Anxiety Taking alprazolam most nights. Takes with gabapentin 600mg . Helps him get to sleep. No AE.   Takes wellbutrin 150mg  24h po qd, sertraline 150mg  po qd Good effect, no AE. Denies hi/si    Past Medical History:  Diagnosis Date   Allergy    Anxiety and depression    Atypical mole 06/11/2010   Lower Mid Back (mild to moderate)   Atypical mole 06/11/2010   Right Abdomen (mild)   Heart palpitations    Hyperlipidemia    Prostate infection    Sleep apnea    CPAP     Past Surgical History:  Procedure Laterality Date   CERVICAL SPINE SURGERY     artificial discs in neck   COLONOSCOPY  at age 28    in [place]-normal exam per pt   SPINE SURGERY Right    disc shaved on right side x2   TONSILLECTOMY      Family History  Problem Relation Age of Onset   Hypertension Mother    Breast cancer Mother    Hypertension Father    Heart disease  Father        CHF    Hypertension Sister    Colon polyps Brother    Colon cancer Neg Hx    Esophageal cancer Neg Hx    Rectal cancer Neg Hx    Stomach cancer Neg Hx     Social History   Socioeconomic History   Marital status: Married    Spouse name: Not on file   Number of children: 4   Years of education: Not on file   Highest education level: Not on file  Occupational History   Occupation: Pharmacist, hospital  Tobacco Use   Smoking status: Never   Smokeless tobacco: Never  Vaping Use   Vaping Use: Never used  Substance and Sexual Activity   Alcohol use: Yes    Comment: occ beer   Drug use: No   Sexual activity: Yes    Partners: Female  Other Topics Concern   Not on file  Social History Narrative   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 [place]   Remarried 2014 - children from both prior marriages spend most of their time at their house.   Social Determinants of Health   Financial Resource Strain: Not on file  Food Insecurity: Not on file  Transportation Needs: Not on file  Physical Activity: Not on file  Stress: Not on file  Social Connections: Not on file  Intimate Partner Violence: Not on file    Outpatient Medications Prior to Visit  Medication Sig Dispense Refill   albuterol (VENTOLIN HFA) 108 (90 Base) MCG/ACT inhaler Inhale 2 puffs into the lungs every 6 (six) hours as needed for wheezing or shortness of breath. 8 g 0   celecoxib (CELEBREX) 200 MG capsule Take 200 mg by mouth daily.     DEXILANT 60 MG capsule Take 1 capsule by mouth every morning.     methocarbamol (ROBAXIN) 500 MG tablet Take 1 tablet (500 mg total) by mouth every 8 (eight) hours as needed for muscle spasms. 30 tablet 0   Multiple Vitamin (MULTIVITAMIN) tablet Take 1 tablet by mouth daily.     ALPRAZolam (XANAX) 0.25 MG tablet TAKE 1 TABLET BY MOUTH TWICE A DAY AS NEEDED FOR ANXIETY 20 tablet 2   atorvastatin (LIPITOR) 20 MG tablet TAKE 1 TABLET BY MOUTH DAILY AT 6 PM. 90 tablet 0   azelastine (ASTELIN) 0.1 % nasal  spray Place 1 spray into both nostrils 2 (two) times daily. Use in each nostril as directed 30 mL 12   buPROPion (WELLBUTRIN XL) 150 MG 24 hr tablet TAKE 1 TABLET BY MOUTH EVERY DAY 90 tablet 1   gabapentin (NEURONTIN) 300 MG capsule Take 2 capsules (600 mg total) by mouth at bedtime. 60 capsule 1   HYDROcodone bit-homatropine (HYCODAN) 5-1.5 MG/5ML syrup Take 5 mLs by mouth at bedtime as needed for cough. 120 mL 0   metoprolol succinate (TOPROL-XL) 25 MG 24 hr tablet TAKE 1/2 TABLET BY MOUTH EVERY DAY 45 tablet 3   predniSONE (STERAPRED UNI-PAK 21 TAB) 10 MG (21) TBPK tablet Take per package instructions. Do not skip doses. Finish entire supply. 1 each 0   rOPINIRole (REQUIP) 0.5 MG tablet Take 2 tablets (1 mg total) by mouth daily. Further refills will need to come from new provider 180 tablet 0   sertraline (ZOLOFT) 100 MG tablet TAKE 1.5 TABLETS (150MG  TOTAL) BY MOUTH DAILY 135 tablet 1   No facility-administered medications prior to visit.    Allergies  Allergen Reactions   Augmentin [Amoxicillin-Pot Clavulanate] Diarrhea    ROS Review of Systems  Constitutional: Negative.   HENT: Negative.    Eyes: Negative.   Respiratory: Negative.    Cardiovascular: Negative.   Gastrointestinal: Negative.   Genitourinary: Negative.   Musculoskeletal: Negative.   Skin: Negative.   Neurological: Negative.   Psychiatric/Behavioral: Negative.    All other systems reviewed and are negative.    Objective:    Physical Exam Constitutional:      General: He is not in acute distress.    Appearance: Normal appearance. He is normal weight. He is not ill-appearing, toxic-appearing or diaphoretic.  Cardiovascular:     Rate and Rhythm: Normal rate and regular rhythm.     Heart sounds: Normal heart sounds. No murmur heard.   No friction rub. No gallop.  Pulmonary:     Effort: Pulmonary effort is normal. No respiratory distress.     Breath sounds: Normal breath sounds. No stridor. No wheezing,  rhonchi or rales.  Chest:     Chest wall: No tenderness.  Neurological:     General: No focal deficit present.     Mental Status: He is alert and oriented to person, place, and time. Mental status is at baseline.  Psychiatric:        Mood and  Affect: Mood normal.        Behavior: Behavior normal.        Thought Content: Thought content normal.        Judgment: Judgment normal.    BP 120/63   Pulse 60   Temp 98.4 F (36.9 C) (Temporal)   Resp 18   Ht 6\' 2"  (1.88 m)   Wt 224 lb 6.4 oz (101.8 kg)   SpO2 99%   BMI 28.81 kg/m  Wt Readings from Last 3 Encounters:  09/22/21 224 lb 6.4 oz (101.8 kg)  05/24/21 226 lb 9.6 oz (102.8 kg)  08/13/20 222 lb 9.6 oz (101 kg)     Health Maintenance Due  Topic Date Due   Pneumococcal Vaccine 48-37 Years old (1 - PCV) Never done   COVID-19 Vaccine (2 - Pfizer risk series) 10/18/2020   Zoster Vaccines- Shingrix (2 of 2) 11/22/2020    There are no preventive care reminders to display for this patient.  Lab Results  Component Value Date   TSH 2.01 08/01/2019   Lab Results  Component Value Date   WBC 8.0 08/01/2019   HGB 15.3 08/01/2019   HCT 44.9 08/01/2019   MCV 86.4 08/01/2019   PLT 196.0 08/01/2019   Lab Results  Component Value Date   NA 142 08/01/2019   K 4.3 08/01/2019   CO2 31 08/01/2019   GLUCOSE 92 08/01/2019   BUN 23 08/01/2019   CREATININE 1.18 08/01/2019   BILITOT 1.6 (H) 08/01/2019   ALKPHOS 80 08/01/2019   AST 15 08/01/2019   ALT 20 08/01/2019   PROT 6.8 08/01/2019   ALBUMIN 4.6 08/01/2019   CALCIUM 9.9 08/01/2019   GFR 65.04 08/01/2019   Lab Results  Component Value Date   CHOL 167 04/22/2020   Lab Results  Component Value Date   HDL 52.10 04/22/2020   Lab Results  Component Value Date   LDLCALC 75 04/22/2020   Lab Results  Component Value Date   TRIG 198.0 (H) 04/22/2020   Lab Results  Component Value Date   CHOLHDL 3 04/22/2020   No results found for: HGBA1C    Assessment & Plan:    Problem List Items Addressed This Visit       Other   Hyperlipidemia LDL goal <130 - Primary   Relevant Medications   metoprolol succinate (TOPROL-XL) 25 MG 24 hr tablet   atorvastatin (LIPITOR) 20 MG tablet   Other Relevant Orders   Lipid panel   Restless leg syndrome   Relevant Medications   gabapentin (NEURONTIN) 300 MG capsule   Palpitations   Relevant Medications   metoprolol succinate (TOPROL-XL) 25 MG 24 hr tablet   Anxiety   Relevant Medications   sertraline (ZOLOFT) 100 MG tablet   buPROPion (WELLBUTRIN XL) 150 MG 24 hr tablet   ALPRAZolam (XANAX) 0.25 MG tablet   Other Visit Diagnoses     Screening for endocrine, metabolic and immunity disorder       Relevant Orders   CBC with Differential/Platelet   Comprehensive metabolic panel   Hemoglobin A1c   TSH   Adjustment insomnia       Relevant Medications   ALPRAZolam (XANAX) 0.25 MG tablet   Acute bacterial sinusitis           Meds ordered this encounter  Medications   sertraline (ZOLOFT) 100 MG tablet    Sig: TAKE 1.5 TABLETS (150MG  TOTAL) BY MOUTH DAILY    Dispense:  135 tablet    Refill:  1    Order Specific Question:   Supervising Provider    Answer:   Carlota Raspberry, JEFFREY R [[id number]]   metoprolol succinate (TOPROL-XL) 25 MG 24 hr tablet    Sig: Take 0.5 tablets (12.5 mg total) by mouth daily.    Dispense:  45 tablet    Refill:  3    Order Specific Question:   Supervising Provider    Answer:   Carlota Raspberry, JEFFREY R [[id number]]   buPROPion (WELLBUTRIN XL) 150 MG 24 hr tablet    Sig: Take 1 tablet (150 mg total) by mouth daily.    Dispense:  90 tablet    Refill:  1    Order Specific Question:   Supervising Provider    Answer:   Carlota Raspberry, JEFFREY R [[id number]]   atorvastatin (LIPITOR) 20 MG tablet    Sig: TAKE 1 TABLET BY MOUTH DAILY AT 6 PM.    Dispense:  90 tablet    Refill:  0    Order Specific Question:   Supervising Provider    Answer:   Carlota Raspberry, JEFFREY R [[id number]]   ALPRAZolam (XANAX) 0.25 MG tablet    Sig: TAKE  1 TABLET BY MOUTH TWICE A DAY AS NEEDED FOR ANXIETY    Dispense:  20 tablet    Refill:  2    Not to exceed 5 additional fills before 10/26/2021    Order Specific Question:   Supervising Provider    Answer:   Carlota Raspberry, JEFFREY R [[id number]]   gabapentin (NEURONTIN) 300 MG capsule    Sig: Take 2 capsules (600 mg total) by mouth at bedtime.    Dispense:  180 capsule    Refill:  1    Order Specific Question:   Supervising Provider    Answer:   Carlota Raspberry, JEFFREY R [[id number]]    Follow-up: Return in about 6 months (around 03/22/2022) for CPE and labs.   PLAN Labs collected. Will follow up with the patient as warranted. Refill meds x 6 mo Return for CPE and labs Patient encouraged to call clinic with any questions, comments, or concerns.  [name], NP

## 2021-09-22 NOTE — Patient Instructions (Addendum)
Mr Careem Yasui to meet you ( in person )  Meds refilled x 6 mo. Let's plan on a physical and labs at that time.  Labs will be back tomorrow or Friday. I'll call with any concerning results, otherwise put notes on MyChart.  Call me if you need anything  Thanks,  Rich     If you have lab work done today you will be contacted with your lab results within the next 2 weeks.  If you have not heard from [place] then please contact us. The fastest way to get your results is to register for My Chart.   IF you received an x-ray today, you will receive an invoice from [facility]. Please contact [facility] at [phone] with questions or concerns regarding your invoice.   IF you received labwork today, you will receive an invoice from [place]. Please contact [facility] at [phone] with questions or concerns regarding your invoice.   Our billing staff will not be able to assist you with questions regarding bills from these companies.  You will be contacted with the lab results as soon as they are available. The fastest way to get your results is to activate your My Chart account. Instructions are located on the last page of this paperwork. If you have not heard from [place] regarding the results in 2 weeks, please contact this office.

## 2021-11-01 DIAGNOSIS — G4700 Insomnia, unspecified: Secondary | ICD-10-CM | POA: Insufficient documentation

## 2021-11-01 NOTE — Assessment & Plan Note (Signed)
Benefits when used but we need to improve compliance.  Discussed comfort issues and sleep hygiene.

## 2021-11-01 NOTE — Assessment & Plan Note (Signed)
He is struggling some in his sleep, pulling his CPAP mask off.  Alprazolam does help and was discussed as part of management. Plan-refill alprazolam

## 2021-12-28 ENCOUNTER — Other Ambulatory Visit: Payer: Self-pay

## 2021-12-28 ENCOUNTER — Encounter: Payer: Self-pay | Admitting: Physician Assistant

## 2021-12-28 ENCOUNTER — Ambulatory Visit: Payer: BC Managed Care – PPO | Admitting: Physician Assistant

## 2021-12-28 DIAGNOSIS — D2261 Melanocytic nevi of right upper limb, including shoulder: Secondary | ICD-10-CM | POA: Diagnosis not present

## 2021-12-28 DIAGNOSIS — D225 Melanocytic nevi of trunk: Secondary | ICD-10-CM | POA: Diagnosis not present

## 2021-12-28 DIAGNOSIS — L905 Scar conditions and fibrosis of skin: Secondary | ICD-10-CM | POA: Diagnosis not present

## 2021-12-28 DIAGNOSIS — Z1283 Encounter for screening for malignant neoplasm of skin: Secondary | ICD-10-CM

## 2021-12-28 DIAGNOSIS — Z86018 Personal history of other benign neoplasm: Secondary | ICD-10-CM

## 2021-12-28 DIAGNOSIS — D485 Neoplasm of uncertain behavior of skin: Secondary | ICD-10-CM

## 2021-12-28 NOTE — Progress Notes (Signed)
° °  Follow-Up Visit   Subjective  Francisco Pena is a 56 y.o. male who presents for the following: Annual Exam (Mid chest x months- little darker. No personal or family history of melanoma or non mole skin cancers. Has had 2 atypical nevi. ).   The following portions of the chart were reviewed this encounter and updated as appropriate:  Tobacco   Allergies   Meds   Problems   Med Hx   Surg Hx   Fam Hx       Objective  Well appearing patient in no apparent distress; mood and affect are within normal limits.  All skin waist up examined.  Right Abdomen (side) - Upper Light brown plaque       Right Upper Arm - Anterior Black macule inside a scar        Assessment & Plan  Neoplasm of uncertain behavior of skin (2) Right Abdomen (side) - Upper  Skin / nail biopsy Type of biopsy: tangential   Informed consent: discussed and consent obtained   Timeout: patient name, date of birth, surgical site, and procedure verified   Anesthesia: the lesion was anesthetized in a standard fashion   Anesthetic:  1% lidocaine w/ epinephrine 1-100,000 local infiltration Instrument used: flexible razor blade   Hemostasis achieved with: aluminum chloride and electrodesiccation   Outcome: patient tolerated procedure well   Post-procedure details: wound care instructions given    Specimen 1 - Surgical pathology Differential Diagnosis: R/O Atypia  Check Margins: Yes  Right Upper Arm - Anterior  Skin / nail biopsy Type of biopsy: tangential   Informed consent: discussed and consent obtained   Timeout: patient name, date of birth, surgical site, and procedure verified   Anesthesia: the lesion was anesthetized in a standard fashion   Anesthetic:  1% lidocaine w/ epinephrine 1-100,000 local infiltration Instrument used: flexible razor blade   Hemostasis achieved with: aluminum chloride and electrodesiccation   Outcome: patient tolerated procedure well   Post-procedure details: wound care  instructions given    Specimen 2 - Surgical pathology Differential Diagnosis: R/O Atypia [id number] Check Margins: yes    I, Judia Arnott, PA-C, have reviewed all documentation's for this visit.  The documentation on 12/28/21 for the exam, diagnosis, procedures and orders are all accurate and complete.

## 2021-12-28 NOTE — Patient Instructions (Signed)

## 2021-12-31 ENCOUNTER — Encounter: Payer: Self-pay | Admitting: Registered Nurse

## 2021-12-31 ENCOUNTER — Telehealth (INDEPENDENT_AMBULATORY_CARE_PROVIDER_SITE_OTHER): Payer: BC Managed Care – PPO | Admitting: Registered Nurse

## 2021-12-31 ENCOUNTER — Other Ambulatory Visit: Payer: Self-pay

## 2021-12-31 DIAGNOSIS — J22 Unspecified acute lower respiratory infection: Secondary | ICD-10-CM | POA: Diagnosis not present

## 2021-12-31 MED ORDER — PREDNISONE 10 MG (21) PO TBPK: ORAL_TABLET | ORAL | 0 refills | Status: DC

## 2021-12-31 MED ORDER — DOXYCYCLINE HYCLATE 100 MG PO TABS: 100.0000 mg | ORAL_TABLET | Freq: Two times a day (BID) | ORAL | 0 refills | Status: DC

## 2021-12-31 NOTE — Progress Notes (Signed)
? ? ?Telemedicine Encounter- SOAP NOTE Established Patient ? ?This telephone encounter was conducted with the patient's (or proxy's) verbal consent via audio telecommunications: yes/no: Yes ?Patient was instructed to have this encounter in a suitably private space; and to only have persons present to whom they give permission to participate. In addition, patient identity was confirmed by use of name plus two identifiers (DOB and address).  I discussed the limitations, risks, security and privacy concerns of performing an evaluation and management service by telephone and the availability of in person appointments. I also discussed with the patient that there may be a patient responsible charge related to this service. The patient expressed understanding and agreed to proceed. ? ?I spent a total of 16 minutes talking with the patient or their proxy. ? ?Patient at home ?Provider in office ? ?Participants: Francisco Ruddy, NP and Francisco Pena ? ?Chief Complaint  ?Patient presents with  ? Nasal Congestion  ?  Patient states for over a week he has been feeling some congestion , coughing up green phelgm. ? ?Patient states he was taking mecinex and other OTC medications   ? ? ?Subjective  ? ?Francisco Pena is a 56 y.o. established patient. Telephone visit today for nasal congestion  ? ?HPI ?Nasal congestion. ?Cough, pnd, productive with green mucus ? ?Oldest daughter sick with bronchitis and sinusitis.  ? ?Negative home covid test ? ?Denies fevers, chills, sweats, nvd, shob, doe ? ?Taking mucinex with minimal relief.  ? ? ?Patient Active Problem List  ? Diagnosis Date Noted  ? Insomnia 11/01/2021  ? Anxiety 01/11/2021  ? GERD (gastroesophageal reflux disease) 02/04/2020  ? Body mass index (BMI) 28.0-28.9, adult 11/15/2019  ? Essential (primary) hypertension 11/15/2019  ? Visit for preventive health examination 06/12/2018  ? Prostate cancer screening 06/12/2018  ? Obstructive sleep apnea 03/02/2017  ? Palpitations 03/02/2017   ? Restless leg syndrome 11/14/2016  ? Displacement of lumbar intervertebral disc without myelopathy 05/19/2016  ? Generalized anxiety disorder 02/24/2015  ? Hyperlipidemia LDL goal <130 02/24/2015  ? Hypertriglyceridemia 02/24/2015  ? ? ?Past Medical History:  ?Diagnosis Date  ? Allergy   ? Anxiety and depression   ? Atypical mole 06/11/2010  ? Lower Mid Back (mild to moderate)  ? Atypical mole 06/11/2010  ? Right Abdomen (mild)  ? Heart palpitations   ? Hyperlipidemia   ? Prostate infection   ? Sleep apnea   ? CPAP   ? ? ?Current Outpatient Medications  ?Medication Sig Dispense Refill  ? albuterol (VENTOLIN HFA) 108 (90 Base) MCG/ACT inhaler Inhale 2 puffs into the lungs every 6 (six) hours as needed for wheezing or shortness of breath. 8 g 0  ? ALPRAZolam (XANAX) 0.25 MG tablet TAKE 1 TABLET BY MOUTH TWICE A DAY AS NEEDED FOR ANXIETY 20 tablet 2  ? atorvastatin (LIPITOR) 20 MG tablet TAKE 1 TABLET BY MOUTH DAILY AT 6 PM. 90 tablet 0  ? DEXILANT 60 MG capsule Take 1 capsule by mouth every morning.    ? doxycycline (VIBRA-TABS) 100 MG tablet Take 1 tablet (100 mg total) by mouth 2 (two) times daily. 20 tablet 0  ? gabapentin (NEURONTIN) 300 MG capsule Take 2 capsules (600 mg total) by mouth at bedtime. 180 capsule 1  ? methocarbamol (ROBAXIN) 500 MG tablet Take 1 tablet (500 mg total) by mouth every 8 (eight) hours as needed for muscle spasms. 30 tablet 0  ? metoprolol succinate (TOPROL-XL) 25 MG 24 hr tablet Take 0.5 tablets (12.5 mg  total) by mouth daily. 45 tablet 3  ? Multiple Vitamin (MULTIVITAMIN) tablet Take 1 tablet by mouth daily.    ? predniSONE (STERAPRED UNI-PAK 21 TAB) 10 MG (21) TBPK tablet Take per package instructions. Do not skip doses. Finish entire supply. 1 each 0  ? sertraline (ZOLOFT) 100 MG tablet TAKE 1.5 TABLETS (150MG  TOTAL) BY MOUTH DAILY 135 tablet 1  ? buPROPion (WELLBUTRIN XL) 150 MG 24 hr tablet Take 1 tablet (150 mg total) by mouth daily. (Patient not taking: Reported on 12/28/2021)  90 tablet 1  ? celecoxib (CELEBREX) 200 MG capsule Take 200 mg by mouth daily. (Patient not taking: Reported on 12/28/2021)    ? ?No current facility-administered medications for this visit.  ? ? ?Allergies  ?Allergen Reactions  ? Augmentin [Amoxicillin-Pot Clavulanate] Diarrhea  ? ? ?Social History  ? ?Socioeconomic History  ? Marital status: Married  ?  Spouse name: Not on file  ? Number of children: 4  ? Years of education: Not on file  ? Highest education level: Not on file  ?Occupational History  ? Occupation: Pharmacist, hospital  ?Tobacco Use  ? Smoking status: Never  ? Smokeless tobacco: Never  ?Vaping Use  ? Vaping Use: Never used  ?Substance and Sexual Activity  ? Alcohol use: Yes  ?  Comment: occ beer  ? Drug use: No  ? Sexual activity: Yes  ?  Partners: Female  ?Other Topics Concern  ? Not on file  ?Social History Narrative  ? 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 Remarried 2014 - children from both prior marriages spend most of their time at their house.  ? ?Social Determinants of Health  ? ?Financial Resource Strain: Not on file  ?Food Insecurity: Not on file  ?Transportation Needs: Not on file  ?Physical Activity: Not on file  ?Stress: Not on file  ?Social Connections: Not on file  ?Intimate Partner Violence: Not on file  ? ? ?ROS ?Per hpi  ? ?Objective  ? ?Vitals as reported by the patient: ?There were no vitals filed for this visit. ? ?Francisco Pena was seen today for nasal congestion. ? ?Diagnoses and all orders for this visit: ? ?Acute respiratory infection ?-     doxycycline (VIBRA-TABS) 100 MG tablet; Take 1 tablet (100 mg total) by mouth 2 (two) times daily. ?-     predniSONE (STERAPRED UNI-PAK 21 TAB) 10 MG (21) TBPK tablet; Take per package instructions. Do not skip doses. Finish entire supply. ? ? ? ?PLAN ?Doxycycline and prednisone as above for infection ?Discussed supportive care with rest, hydration, and OTCs. ?Return if worsening or failing to improve ?Patient encouraged to call clinic with any questions, comments,  or concerns. ? ?I discussed the assessment and treatment plan with the patient. The patient was provided an opportunity to ask questions and all were answered. The patient agreed with the plan and demonstrated an understanding of the instructions. ?  ?The patient was advised to call back or seek an in-person evaluation if the symptoms worsen or if the condition fails to improve as anticipated. ? ?I provided 16 minutes of non-face-to-face time during this encounter. ? ?[name], NP ?

## 2021-12-31 NOTE — Patient Instructions (Signed)
° ° ° °  If you have lab work done today you will be contacted with your lab results within the next 2 weeks.  If you have not heard from us then please contact us. The fastest way to get your results is to register for My Chart. ° ° °IF you received an x-ray today, you will receive an invoice from [facility]. Please contact [facility] at [phone] with questions or concerns regarding your invoice.  ° °IF you received labwork today, you will receive an invoice from [facility]. Please contact [facility] at [phone] with questions or concerns regarding your invoice.  ° °Our billing staff will not be able to assist you with questions regarding bills from these companies. ° °You will be contacted with the lab results as soon as they are available. The fastest way to get your results is to activate your My Chart account. Instructions are located on the last page of this paperwork. If you have not heard from us regarding the results in 2 weeks, please contact this office. °  ° ° ° °

## 2022-01-04 ENCOUNTER — Telehealth: Payer: Self-pay | Admitting: Registered Nurse

## 2022-01-04 ENCOUNTER — Encounter: Payer: Self-pay | Admitting: Registered Nurse

## 2022-01-04 NOTE — Telephone Encounter (Signed)
Pt called in stating that he saw Francisco Pena last week and he isn't much better. He is still having uri and coughing up green mucus. He wanted to know what else he can do ? ?Please advise pt uses [facility] in [place]  ?

## 2022-01-04 NOTE — Telephone Encounter (Signed)
Should give it another few days on the doxycycline before reassessing ? ?Thanks, ? ?Rich

## 2022-01-04 NOTE — Telephone Encounter (Signed)
Pt still not feeling well  ?

## 2022-01-05 ENCOUNTER — Encounter: Payer: Self-pay | Admitting: Registered Nurse

## 2022-01-05 ENCOUNTER — Ambulatory Visit (INDEPENDENT_AMBULATORY_CARE_PROVIDER_SITE_OTHER): Payer: BC Managed Care – PPO | Admitting: Registered Nurse

## 2022-01-05 VITALS — BP 132/74 | HR 66 | Temp 97.8°F | Resp 18 | Ht 74.0 in | Wt 223.0 lb

## 2022-01-05 DIAGNOSIS — H109 Unspecified conjunctivitis: Secondary | ICD-10-CM

## 2022-01-05 DIAGNOSIS — J22 Unspecified acute lower respiratory infection: Secondary | ICD-10-CM

## 2022-01-05 DIAGNOSIS — J302 Other seasonal allergic rhinitis: Secondary | ICD-10-CM

## 2022-01-05 MED ORDER — MONTELUKAST SODIUM 10 MG PO TABS: 10.0000 mg | ORAL_TABLET | Freq: Every day | ORAL | 3 refills | Status: DC

## 2022-01-05 MED ORDER — AZELASTINE HCL 0.1 % NA SOLN: 1.0000 | Freq: Two times a day (BID) | NASAL | 12 refills | Status: DC

## 2022-01-05 MED ORDER — OFLOXACIN 0.3 % OP SOLN: 1.0000 [drp] | Freq: Four times a day (QID) | OPHTHALMIC | 0 refills | Status: DC

## 2022-01-05 MED ORDER — CETIRIZINE HCL 10 MG PO TABS: 10.0000 mg | ORAL_TABLET | Freq: Every day | ORAL | 11 refills | Status: DC

## 2022-01-05 MED ORDER — METHYLPREDNISOLONE ACETATE 80 MG/ML IJ SUSP
80.0000 mg | Freq: Once | INTRAMUSCULAR | Status: AC
  Administered 2022-01-05: 80 mg via INTRAMUSCULAR

## 2022-01-05 MED ORDER — AZITHROMYCIN 250 MG PO TABS: ORAL_TABLET | ORAL | 0 refills | Status: AC

## 2022-01-05 NOTE — Telephone Encounter (Signed)
Patient has an appointment today

## 2022-01-05 NOTE — Progress Notes (Signed)
Established Patient Office Visit  Subjective:  Patient ID: Francisco Pena, male    DOB: 03/04/1966  Age: 56 y.o. MRN: [id number]  CC:  Chief Complaint  Patient presents with   Conjunctivitis    Patient states he is here because he is still not better and woke up with double pink eye.    HPI Francisco Pena presents for follow up, conjunctivitis  Seen via [facility] last Friday by me for acute respiratory infection. He had been experiencing cough, nasal congestion, pnd, production of green mucus x 1-2 weeks. His daughter had had bronchitis and sinusitis.  He had negative home covid test.   He comes in today on day 5 of doxycycline, prednisone. He notes upon waking this morning he had conjunctivitis in both eyes with crusting on each.   Past Medical History:  Diagnosis Date   Allergy    Anxiety and depression    Atypical mole 06/11/2010   Lower Mid Back (mild to moderate)   Atypical mole 06/11/2010   Right Abdomen (mild)   Heart palpitations    Hyperlipidemia    Prostate infection    Sleep apnea    CPAP     Past Surgical History:  Procedure Laterality Date   CERVICAL SPINE SURGERY     artificial discs in neck   COLONOSCOPY  at age 51    in [place]-normal exam per pt   SPINE SURGERY Right    disc shaved on right side x2   TONSILLECTOMY      Family History  Problem Relation Age of Onset   Hypertension Mother    Breast cancer Mother    Hypertension Father    Heart disease Father        CHF    Hypertension Sister    Colon polyps Brother    Colon cancer Neg Hx    Esophageal cancer Neg Hx    Rectal cancer Neg Hx    Stomach cancer Neg Hx     Social History   Socioeconomic History   Marital status: Married    Spouse name: Not on file   Number of children: 4   Years of education: Not on file   Highest education level: Not on file  Occupational History   Occupation: Pharmacist, hospital  Tobacco Use   Smoking status: Never   Smokeless tobacco: Never  Vaping Use    Vaping Use: Never used  Substance and Sexual Activity   Alcohol use: Yes    Comment: occ beer   Drug use: No   Sexual activity: Yes    Partners: Female  Other Topics Concern   Not on file  Social History Narrative   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 [place]   Remarried 2014 - children from both prior marriages spend most of their time at their house.   Social Determinants of Health   Financial Resource Strain: Not on file  Food Insecurity: Not on file  Transportation Needs: Not on file  Physical Activity: Not on file  Stress: Not on file  Social Connections: Not on file  Intimate Partner Violence: Not on file    Outpatient Medications Prior to Visit  Medication Sig Dispense Refill   albuterol (VENTOLIN HFA) 108 (90 Base) MCG/ACT inhaler Inhale 2 puffs into the lungs every 6 (six) hours as needed for wheezing or shortness of breath. 8 g 0   ALPRAZolam (XANAX) 0.25 MG tablet TAKE 1 TABLET BY MOUTH TWICE A DAY AS NEEDED FOR ANXIETY 20 tablet 2  atorvastatin (LIPITOR) 20 MG tablet TAKE 1 TABLET BY MOUTH DAILY AT 6 PM. 90 tablet 0   DEXILANT 60 MG capsule Take 1 capsule by mouth every morning.     doxycycline (VIBRA-TABS) 100 MG tablet Take 1 tablet (100 mg total) by mouth 2 (two) times daily. 20 tablet 0   gabapentin (NEURONTIN) 300 MG capsule Take 2 capsules (600 mg total) by mouth at bedtime. 180 capsule 1   methocarbamol (ROBAXIN) 500 MG tablet Take 1 tablet (500 mg total) by mouth every 8 (eight) hours as needed for muscle spasms. 30 tablet 0   metoprolol succinate (TOPROL-XL) 25 MG 24 hr tablet Take 0.5 tablets (12.5 mg total) by mouth daily. 45 tablet 3   Multiple Vitamin (MULTIVITAMIN) tablet Take 1 tablet by mouth daily.     predniSONE (STERAPRED UNI-PAK 21 TAB) 10 MG (21) TBPK tablet Take per package instructions. Do not skip doses. Finish entire supply. 1 each 0   sertraline (ZOLOFT) 100 MG tablet TAKE 1.5 TABLETS ('150MG'$  TOTAL) BY MOUTH DAILY 135 tablet 1   buPROPion (WELLBUTRIN XL) 150  MG 24 hr tablet Take 1 tablet (150 mg total) by mouth daily. (Patient not taking: Reported on 12/28/2021) 90 tablet 1   celecoxib (CELEBREX) 200 MG capsule Take 200 mg by mouth daily. (Patient not taking: Reported on 12/28/2021)     No facility-administered medications prior to visit.    Allergies  Allergen Reactions   Augmentin [Amoxicillin-Pot Clavulanate] Diarrhea    ROS Review of Systems Per hpi     Objective:    Physical Exam Constitutional:      General: He is not in acute distress.    Appearance: Normal appearance. He is normal weight. He is not ill-appearing, toxic-appearing or diaphoretic.  HENT:     Right Ear: Tympanic membrane, ear canal and external ear normal. There is no impacted cerumen.     Left Ear: Tympanic membrane, ear canal and external ear normal. There is no impacted cerumen.  Cardiovascular:     Rate and Rhythm: Normal rate and regular rhythm.     Heart sounds: Normal heart sounds. No murmur heard.   No friction rub. No gallop.  Pulmonary:     Effort: Pulmonary effort is normal. No respiratory distress.     Breath sounds: Normal breath sounds. No stridor. No wheezing, rhonchi or rales.  Chest:     Chest wall: No tenderness.  Neurological:     General: No focal deficit present.     Mental Status: He is alert and oriented to person, place, and time. Mental status is at baseline.  Psychiatric:        Mood and Affect: Mood normal.        Behavior: Behavior normal.        Thought Content: Thought content normal.        Judgment: Judgment normal.    BP 132/74    Pulse 66    Temp 97.8 F (36.6 C) (Temporal)    Resp 18    Ht '6\' 2"'$  (1.88 m)    Wt 223 lb (101.2 kg)    SpO2 99%    BMI 28.63 kg/m  Wt Readings from Last 3 Encounters:  01/05/22 223 lb (101.2 kg)  09/22/21 224 lb 6.4 oz (101.8 kg)  05/24/21 226 lb 9.6 oz (102.8 kg)     Health Maintenance Due  Topic Date Due   COVID-19 Vaccine (2 - Pfizer risk series) 10/18/2020   Zoster Vaccines-  Shingrix (2  of 2) 11/22/2020    There are no preventive care reminders to display for this patient.  Lab Results  Component Value Date   TSH 0.83 09/22/2021   Lab Results  Component Value Date   WBC 5.0 09/22/2021   HGB 14.2 09/22/2021   HCT 42.8 09/22/2021   MCV 86.1 09/22/2021   PLT 176.0 09/22/2021   Lab Results  Component Value Date   NA 141 09/22/2021   K 4.3 09/22/2021   CO2 29 09/22/2021   GLUCOSE 89 09/22/2021   BUN 18 09/22/2021   CREATININE 1.15 09/22/2021   BILITOT 1.5 (H) 09/22/2021   ALKPHOS 69 09/22/2021   AST 28 09/22/2021   ALT 24 09/22/2021   PROT 6.5 09/22/2021   ALBUMIN 4.4 09/22/2021   CALCIUM 9.4 09/22/2021   GFR 72.76 09/22/2021   Lab Results  Component Value Date   CHOL 178 09/22/2021   Lab Results  Component Value Date   HDL 52.30 09/22/2021   Lab Results  Component Value Date   LDLCALC 94 09/22/2021   Lab Results  Component Value Date   TRIG 160.0 (H) 09/22/2021   Lab Results  Component Value Date   CHOLHDL 3 09/22/2021   Lab Results  Component Value Date   HGBA1C 5.5 09/22/2021      Assessment & Plan:   Problem List Items Addressed This Visit   None Visit Diagnoses     Acute respiratory infection    -  Primary   Relevant Medications   azithromycin (ZITHROMAX) 250 MG tablet   methylPREDNISolone acetate (DEPO-MEDROL) injection 80 mg   cetirizine (ZYRTEC) 10 MG tablet   montelukast (SINGULAIR) 10 MG tablet   azelastine (ASTELIN) 0.1 % nasal spray   Seasonal allergies       Relevant Medications   azithromycin (ZITHROMAX) 250 MG tablet   methylPREDNISolone acetate (DEPO-MEDROL) injection 80 mg   cetirizine (ZYRTEC) 10 MG tablet   montelukast (SINGULAIR) 10 MG tablet   azelastine (ASTELIN) 0.1 % nasal spray   Bacterial conjunctivitis       Relevant Medications   azithromycin (ZITHROMAX) 250 MG tablet   ofloxacin (OCUFLOX) 0.3 % ophthalmic solution       Meds ordered this encounter  Medications   azithromycin  (ZITHROMAX) 250 MG tablet    Sig: Take 2 tablets on day 1, then 1 tablet daily on days 2 through 5    Dispense:  6 tablet    Refill:  0    Order Specific Question:   Supervising Provider    Answer:   Carlota Raspberry, JEFFREY R [[id number]]   methylPREDNISolone acetate (DEPO-MEDROL) injection 80 mg   ofloxacin (OCUFLOX) 0.3 % ophthalmic solution    Sig: Place 1 drop into both eyes 4 (four) times daily.    Dispense:  5 mL    Refill:  0    Order Specific Question:   Supervising Provider    Answer:   Carlota Raspberry, JEFFREY R [[id number]]   cetirizine (ZYRTEC) 10 MG tablet    Sig: Take 1 tablet (10 mg total) by mouth daily.    Dispense:  30 tablet    Refill:  11    Order Specific Question:   Supervising Provider    Answer:   Carlota Raspberry, JEFFREY R [[id number]]   montelukast (SINGULAIR) 10 MG tablet    Sig: Take 1 tablet (10 mg total) by mouth at bedtime.    Dispense:  30 tablet    Refill:  3    Order Specific Question:  Supervising Provider    Answer:   Carlota Raspberry, JEFFREY R [[id number]]   azelastine (ASTELIN) 0.1 % nasal spray    Sig: Place 1 spray into both nostrils 2 (two) times daily. Use in each nostril as directed    Dispense:  30 mL    Refill:  12    Order Specific Question:   Supervising Provider    Answer:   Carlota Raspberry, JEFFREY R [[id number]]    Follow-up: Return if symptoms worsen or fail to improve.   PLAN Depo medrol injection given Stop doxy, start z pack Start cetirizine daily. Montelukast nightly. Discussed risks Sending azelastine nasal spray Patient encouraged to call clinic with any questions, comments, or concerns.  [name], NP

## 2022-01-05 NOTE — Patient Instructions (Addendum)
Francisco Pena - ? ?Hate to see you again so soon, but we'll throw everything at it ? ?Ditch the doxy and prednisone. Start z pack and will give steroid shot today.  ?Use eye drops for at least next 3-4 days until eyes resolved. ?Start cetirizine daily in AM, montelukast in PM. These are allergy meds. Take as long as you need. Montelukast may cause irritability and mood changes - keep an eye on this. ?Can use flonase in AM, azelastine in PM daily as needed ?Recommend daily saline rinse of sinuses ? ?Call if no improvement by Friday ? ?Thanks, ? ?Rich  ? ? ? ?If you have lab work done today you will be contacted with your lab results within the next 2 weeks.  If you have not heard from [place] then please contact us. The fastest way to get your results is to register for My Chart. ? ? ?IF you received an x-ray today, you will receive an invoice from [facility]. Please contact [facility] at [phone] with questions or concerns regarding your invoice.  ? ?IF you received labwork today, you will receive an invoice from [facility]. Please contact [facility] at [phone] with questions or concerns regarding your invoice.  ? ?Our billing staff will not be able to assist you with questions regarding bills from these companies. ? ?You will be contacted with the lab results as soon as they are available. The fastest way to get your results is to activate your My Chart account. Instructions are located on the last page of this paperwork. If you have not heard from [place] regarding the results in 2 weeks, please contact this office. ?  ?  ?

## 2022-01-09 ENCOUNTER — Other Ambulatory Visit: Payer: Self-pay | Admitting: Registered Nurse

## 2022-01-09 DIAGNOSIS — E785 Hyperlipidemia, unspecified: Secondary | ICD-10-CM

## 2022-03-10 ENCOUNTER — Other Ambulatory Visit: Payer: Self-pay

## 2022-03-10 ENCOUNTER — Ambulatory Visit (INDEPENDENT_AMBULATORY_CARE_PROVIDER_SITE_OTHER): Payer: BC Managed Care – PPO | Admitting: Registered Nurse

## 2022-03-10 VITALS — BP 132/76 | HR 70 | Temp 98.1°F | Resp 18 | Ht 74.0 in | Wt 223.2 lb

## 2022-03-10 DIAGNOSIS — E785 Hyperlipidemia, unspecified: Secondary | ICD-10-CM

## 2022-03-10 DIAGNOSIS — Z125 Encounter for screening for malignant neoplasm of prostate: Secondary | ICD-10-CM | POA: Diagnosis not present

## 2022-03-10 DIAGNOSIS — R002 Palpitations: Secondary | ICD-10-CM

## 2022-03-10 DIAGNOSIS — F5102 Adjustment insomnia: Secondary | ICD-10-CM

## 2022-03-10 DIAGNOSIS — G2581 Restless legs syndrome: Secondary | ICD-10-CM

## 2022-03-10 DIAGNOSIS — J302 Other seasonal allergic rhinitis: Secondary | ICD-10-CM | POA: Diagnosis not present

## 2022-03-10 DIAGNOSIS — J22 Unspecified acute lower respiratory infection: Secondary | ICD-10-CM | POA: Diagnosis not present

## 2022-03-10 DIAGNOSIS — F419 Anxiety disorder, unspecified: Secondary | ICD-10-CM

## 2022-03-10 LAB — PSA: PSA: 0.93 ng/mL (ref 0.10–4.00)

## 2022-03-10 MED ORDER — GABAPENTIN 300 MG PO CAPS: 600.0000 mg | ORAL_CAPSULE | Freq: Every day | ORAL | 1 refills | Status: DC

## 2022-03-10 MED ORDER — ALPRAZOLAM 0.25 MG PO TABS: ORAL_TABLET | ORAL | 2 refills | Status: DC

## 2022-03-10 MED ORDER — MONTELUKAST SODIUM 10 MG PO TABS: 10.0000 mg | ORAL_TABLET | Freq: Every day | ORAL | 3 refills | Status: DC

## 2022-03-10 MED ORDER — SERTRALINE HCL 100 MG PO TABS: ORAL_TABLET | ORAL | 1 refills | Status: DC

## 2022-03-10 MED ORDER — METOPROLOL SUCCINATE ER 25 MG PO TB24: 12.5000 mg | ORAL_TABLET | Freq: Every day | ORAL | 3 refills | Status: DC

## 2022-03-10 MED ORDER — ATORVASTATIN CALCIUM 20 MG PO TABS: 20.0000 mg | ORAL_TABLET | Freq: Every day | ORAL | 1 refills | Status: DC

## 2022-03-10 NOTE — Progress Notes (Signed)
? ?Established Patient Office Visit ? ?Subjective:  ?Patient ID: Francisco Pena, male    DOB: [birth year]  Age: 56 y.o. MRN: [id number] ? ?CC:  ?Chief Complaint  ?Patient presents with  ? Medication Refill  ?  All meds  ? ? ?HPI ?Francisco Pena presents for med refills, employment paperwork ?Declines CPE today. Declines labs. Reviewed risks, benefits, and side effects, pt voices understanding. ? ?Anxiety ?Sertraline '100mg'$  po qd. Good effect, no AE. Hopes to continue. ?Does occ use alprazolam for sleep. Per pdmp, last rx was some time ago. Would like refill in case. ? ?RLS ?Gabapentin '600mg'$  po qhs. Good effect, no AE. Hopes to continue. ? ?Palpitations ?On metoprolol '25mg'$  XL po qd. Good effect. No AE. Denies symptoms of hypotension or bradycardia.  ? ?Seasonal allergies ?Worse during this spring. ?Takes zyrtec, astelin nasal spray, montelukast.  ?Good regimen for him. Would like to continue ? ?Outpatient Medications Prior to Visit  ?Medication Sig Dispense Refill  ? albuterol (VENTOLIN HFA) 108 (90 Base) MCG/ACT inhaler Inhale 2 puffs into the lungs every 6 (six) hours as needed for wheezing or shortness of breath. 8 g 0  ? azelastine (ASTELIN) 0.1 % nasal spray Place 1 spray into both nostrils 2 (two) times daily. Use in each nostril as directed 30 mL 12  ? cetirizine (ZYRTEC) 10 MG tablet Take 1 tablet (10 mg total) by mouth daily. 30 tablet 11  ? DEXILANT 60 MG capsule Take 1 capsule by mouth every morning.    ? methocarbamol (ROBAXIN) 500 MG tablet Take 1 tablet (500 mg total) by mouth every 8 (eight) hours as needed for muscle spasms. 30 tablet 0  ? Multiple Vitamin (MULTIVITAMIN) tablet Take 1 tablet by mouth daily.    ? ALPRAZolam (XANAX) 0.25 MG tablet TAKE 1 TABLET BY MOUTH TWICE A DAY AS NEEDED FOR ANXIETY 20 tablet 2  ? atorvastatin (LIPITOR) 20 MG tablet TAKE 1 TABLET BY MOUTH DAILY AT 6 PM. 90 tablet 0  ? doxycycline (VIBRA-TABS) 100 MG tablet Take 1 tablet (100 mg total) by mouth 2 (two) times daily. 20  tablet 0  ? gabapentin (NEURONTIN) 300 MG capsule Take 2 capsules (600 mg total) by mouth at bedtime. 180 capsule 1  ? metoprolol succinate (TOPROL-XL) 25 MG 24 hr tablet Take 0.5 tablets (12.5 mg total) by mouth daily. 45 tablet 3  ? montelukast (SINGULAIR) 10 MG tablet Take 1 tablet (10 mg total) by mouth at bedtime. 30 tablet 3  ? ofloxacin (OCUFLOX) 0.3 % ophthalmic solution Place 1 drop into both eyes 4 (four) times daily. 5 mL 0  ? predniSONE (STERAPRED UNI-PAK 21 TAB) 10 MG (21) TBPK tablet Take per package instructions. Do not skip doses. Finish entire supply. 1 each 0  ? sertraline (ZOLOFT) 100 MG tablet TAKE 1.5 TABLETS ('150MG'$  TOTAL) BY MOUTH DAILY 135 tablet 1  ? buPROPion (WELLBUTRIN XL) 150 MG 24 hr tablet Take 1 tablet (150 mg total) by mouth daily. (Patient not taking: Reported on 03/10/2022) 90 tablet 1  ? celecoxib (CELEBREX) 200 MG capsule Take 200 mg by mouth daily. (Patient not taking: Reported on 12/28/2021)    ? ?No facility-administered medications prior to visit.  ? ? ?Review of Systems  ?Constitutional: Negative.   ?HENT: Negative.    ?Eyes: Negative.   ?Respiratory: Negative.    ?Cardiovascular: Negative.   ?Gastrointestinal: Negative.   ?Genitourinary: Negative.   ?Musculoskeletal: Negative.   ?Skin: Negative.   ?Neurological: Negative.   ?Psychiatric/Behavioral: Negative.    ?  All other systems reviewed and are negative. ? ?  ?Objective:  ?  ? ?BP 132/76   Pulse 70   Temp 98.1 ?F (36.7 ?C) (Temporal)   Resp 18   Ht '6\' 2"'$  (1.88 m)   Wt 223 lb 3.2 oz (101.2 kg)   SpO2 97%   BMI 28.66 kg/m?  ? ?Wt Readings from Last 3 Encounters:  ?03/10/22 223 lb 3.2 oz (101.2 kg)  ?01/05/22 223 lb (101.2 kg)  ?09/22/21 224 lb 6.4 oz (101.8 kg)  ? ?Physical Exam ?Constitutional:   ?   General: He is not in acute distress. ?   Appearance: Normal appearance. He is normal weight. He is not ill-appearing, toxic-appearing or diaphoretic.  ?Cardiovascular:  ?   Rate and Rhythm: Normal rate and regular rhythm.   ?   Heart sounds: Normal heart sounds. No murmur heard. ?  No friction rub. No gallop.  ?Pulmonary:  ?   Effort: Pulmonary effort is normal. No respiratory distress.  ?   Breath sounds: Normal breath sounds. No stridor. No wheezing, rhonchi or rales.  ?Chest:  ?   Chest wall: No tenderness.  ?Neurological:  ?   General: No focal deficit present.  ?   Mental Status: He is alert and oriented to person, place, and time. Mental status is at baseline.  ?Psychiatric:     ?   Mood and Affect: Mood normal.     ?   Behavior: Behavior normal.     ?   Thought Content: Thought content normal.     ?   Judgment: Judgment normal.  ? ? ?Results for orders placed or performed in visit on 03/10/22  ?PSA  ?Result Value Ref Range  ? PSA 0.93 0.10 - 4.00 ng/mL  ? ? ? ? ?The 10-year ASCVD risk score (Arnett DK, et al., 2019) is: 9.4% ? ?  ?Assessment & Plan:  ? ?Problem List Items Addressed This Visit   ? ?  ? Other  ? Hyperlipidemia LDL goal <130  ? Relevant Medications  ? metoprolol succinate (TOPROL-XL) 25 MG 24 hr tablet  ? atorvastatin (LIPITOR) 20 MG tablet  ? Restless leg syndrome  ? Relevant Medications  ? gabapentin (NEURONTIN) 300 MG capsule  ? Palpitations  ? Relevant Medications  ? metoprolol succinate (TOPROL-XL) 25 MG 24 hr tablet  ? Anxiety  ? Relevant Medications  ? ALPRAZolam (XANAX) 0.25 MG tablet  ? sertraline (ZOLOFT) 100 MG tablet  ? Insomnia  ? Relevant Medications  ? ALPRAZolam (XANAX) 0.25 MG tablet  ? ?Other Visit Diagnoses   ? ? Screening PSA (prostate specific antigen)    -  Primary  ? Relevant Orders  ? PSA (Completed)  ? Acute respiratory infection      ? Relevant Medications  ? montelukast (SINGULAIR) 10 MG tablet  ? Seasonal allergies      ? Relevant Medications  ? montelukast (SINGULAIR) 10 MG tablet  ? ?  ? ? ?Meds ordered this encounter  ?Medications  ? montelukast (SINGULAIR) 10 MG tablet  ?  Sig: Take 1 tablet (10 mg total) by mouth at bedtime.  ?  Dispense:  30 tablet  ?  Refill:  3  ?  Order  Specific Question:   Supervising Provider  ?  Answer:   Carlota Raspberry, JEFFREY R [[id number]]  ? metoprolol succinate (TOPROL-XL) 25 MG 24 hr tablet  ?  Sig: Take 0.5 tablets (12.5 mg total) by mouth daily.  ?  Dispense:  45 tablet  ?  Refill:  3  ?  Order Specific Question:   Supervising Provider  ?  Answer:   Carlota Raspberry, JEFFREY R [[id number]]  ? gabapentin (NEURONTIN) 300 MG capsule  ?  Sig: Take 2 capsules (600 mg total) by mouth at bedtime.  ?  Dispense:  180 capsule  ?  Refill:  1  ?  Order Specific Question:   Supervising Provider  ?  Answer:   Carlota Raspberry, JEFFREY R [[id number]]  ? ALPRAZolam (XANAX) 0.25 MG tablet  ?  Sig: TAKE 1 TABLET BY MOUTH TWICE A DAY AS NEEDED FOR ANXIETY  ?  Dispense:  20 tablet  ?  Refill:  2  ?  Not to exceed 5 additional fills before 10/26/2021  ?  Order Specific Question:   Supervising Provider  ?  Answer:   Carlota Raspberry, JEFFREY R [[id number]]  ? sertraline (ZOLOFT) 100 MG tablet  ?  Sig: TAKE 1.5 TABLETS ('150MG'$  TOTAL) BY MOUTH DAILY  ?  Dispense:  135 tablet  ?  Refill:  1  ?  Order Specific Question:   Supervising Provider  ?  Answer:   Carlota Raspberry, JEFFREY R [[id number]]  ? atorvastatin (LIPITOR) 20 MG tablet  ?  Sig: Take 1 tablet (20 mg total) by mouth daily.  ?  Dispense:  90 tablet  ?  Refill:  1  ?  Order Specific Question:   Supervising Provider  ?  Answer:   Carlota Raspberry, JEFFREY R [[id number]]  ? ? ?No follow-ups on file.  ? ?PLAN ?Refill meds as above ?Advise he return for CPE and labs at appropriate interval. ?Patient encouraged to call clinic with any questions, comments, or concerns. ? ? ?[name], NP ?

## 2022-03-18 ENCOUNTER — Other Ambulatory Visit: Payer: Self-pay | Admitting: Registered Nurse

## 2022-03-18 DIAGNOSIS — F419 Anxiety disorder, unspecified: Secondary | ICD-10-CM

## 2022-03-22 ENCOUNTER — Ambulatory Visit: Payer: BC Managed Care – PPO | Admitting: Registered Nurse

## 2022-03-23 ENCOUNTER — Encounter: Payer: Self-pay | Admitting: Registered Nurse

## 2022-03-23 NOTE — Telephone Encounter (Signed)
MEDICATION:ALPRAZolam (XANAX) 0.25 MG tablet  PHARMACY:[id number] - [address]  Comments: Patient is requesting .50 MG as all [facility] are out of .25.  **Let patient know to contact pharmacy at the end of the day to make sure medication is ready. **  ** Please notify patient to allow 48-72 hours to process**  **Encourage patient to contact the pharmacy for refills or they can [facility] refills [facility]*

## 2022-03-24 ENCOUNTER — Other Ambulatory Visit: Payer: Self-pay | Admitting: Registered Nurse

## 2022-03-24 DIAGNOSIS — F5102 Adjustment insomnia: Secondary | ICD-10-CM

## 2022-03-24 DIAGNOSIS — F419 Anxiety disorder, unspecified: Secondary | ICD-10-CM

## 2022-03-24 MED ORDER — ALPRAZOLAM 0.5 MG PO TABS: 0.2500 mg | ORAL_TABLET | Freq: Two times a day (BID) | ORAL | 0 refills | Status: DC | PRN

## 2022-03-24 NOTE — Telephone Encounter (Signed)
Please advise patient message below.

## 2022-04-13 ENCOUNTER — Encounter: Payer: Self-pay | Admitting: Primary Care

## 2022-04-13 ENCOUNTER — Ambulatory Visit (INDEPENDENT_AMBULATORY_CARE_PROVIDER_SITE_OTHER): Payer: BC Managed Care – PPO | Admitting: Primary Care

## 2022-04-13 VITALS — BP 120/76 | HR 79 | Temp 98.1°F | Ht 74.0 in | Wt 224.2 lb

## 2022-04-13 DIAGNOSIS — G4733 Obstructive sleep apnea (adult) (pediatric): Secondary | ICD-10-CM | POA: Diagnosis not present

## 2022-04-13 NOTE — Patient Instructions (Addendum)
Recommendations - Continue to wear CPAP aim for 4 to 6 hours or more every night - Do not drive experiencing excessive daytime sleepiness or fatigue - Lowering max pressure setting to help to help with airleaks and comfort  - Notify our office if you have not heard from applicator about getting a new machine in the next 4 weeks  Orders: - New CPAP machine pressure 5-12cm h20, change DME to [facility] (HST 03/29/17)  Follow-up: - 1 year with Dr. Annamaria Boots or sooner if needed    CPAP and BIPAP Information CPAP and BIPAP are methods that use air pressure to keep your airways open and to help you breathe well. CPAP and BIPAP use different amounts of pressure. Your health care provider will tell you whether CPAP or BIPAP would be more helpful for you. CPAP stands for "continuous positive airway pressure." With CPAP, the amount of pressure stays the same while you breathe in (inhale) and out (exhale). BIPAP stands for "bi-level positive airway pressure." With BIPAP, the amount of pressure will be higher when you inhale and lower when you exhale. This allows you to take larger breaths. CPAP or BIPAP may be used in the hospital, or your health care provider may want you to use it at home. You may need to have a sleep study before your health care provider can order a machine for you to use at home. What are the advantages? CPAP or BIPAP can be helpful if you have: Sleep apnea. Chronic obstructive pulmonary disease (COPD). Heart failure. Medical conditions that cause muscle weakness, including muscular dystrophy or amyotrophic lateral sclerosis (ALS). Other problems that cause breathing to be shallow, weak, abnormal, or difficult. CPAP and BIPAP are most commonly used for obstructive sleep apnea (OSA) to keep the airways from collapsing when the muscles relax during sleep. What are the risks? Generally, this is a safe treatment. However, problems may occur, including: Irritated skin or skin sores if the  mask does not fit properly. Dry or stuffy nose or nosebleeds. Dry mouth. Feeling gassy or bloated. Sinus or lung infection if the equipment is not cleaned properly. When should CPAP or BIPAP be used? In most cases, the mask only needs to be worn during sleep. Generally, the mask needs to be worn throughout the night and during any daytime naps. People with certain medical conditions may also need to wear the mask at other times, such as when they are awake. Follow instructions from your health care provider about when to use the machine. What happens during CPAP or BIPAP?  Both CPAP and BIPAP are provided by a small machine with a flexible plastic tube that attaches to a plastic mask that you wear. Air is blown through the mask into your nose or mouth. The amount of pressure that is used to blow the air can be adjusted on the machine. Your health care provider will set the pressure setting and help you find the best mask for you. Tips for using the mask Because the mask needs to be snug, some people feel trapped or closed-in (claustrophobic) when first using the mask. If you feel this way, you may need to get used to the mask. One way to do this is to hold the mask loosely over your nose or mouth and then gradually apply the mask more snugly. You can also gradually increase the amount of time that you use the mask. Masks are available in various types and sizes. If your mask does not fit well, talk  with your health care provider about getting a different one. Some common types of masks include: Full face masks, which fit over the mouth and nose. Nasal masks, which fit over the nose. Nasal pillow or prong masks, which fit into the nostrils. If you are using a mask that fits over your nose and you tend to breathe through your mouth, a chin strap may be applied to help keep your mouth closed. Use a skin barrier to protect your skin as told by your health care provider. Some CPAP and BIPAP machines  have alarms that may sound if the mask comes off or develops a leak. If you have trouble with the mask, it is very important that you talk with your health care provider about finding a way to make the mask easier to tolerate. Do not stop using the mask. There could be a negative impact on your health if you stop using the mask. Tips for using the machine Place your CPAP or BIPAP machine on a secure table or stand near an electrical outlet. Know where the on/off switch is on the machine. Follow instructions from your health care provider about how to set the pressure on your machine and when you should use it. Do not eat or drink while the CPAP or BIPAP machine is on. Food or fluids could get pushed into your lungs by the pressure of the CPAP or BIPAP. For home use, CPAP and BIPAP machines can be rented or purchased through home health care companies. Many different brands of machines are available. Renting a machine before purchasing may help you find out which particular machine works well for you. Your health insurance company may also decide which machine you may get. Keep the CPAP or BIPAP machine and attachments clean. Ask your health care provider for specific instructions. Check the humidifier if you have a dry stuffy nose or nosebleeds. Make sure it is working correctly. Follow these instructions at home: Take over-the-counter and prescription medicines only as told by your health care provider. Ask if you can take sinus medicine if your sinuses are blocked. Do not use any products that contain nicotine or tobacco. These products include cigarettes, chewing tobacco, and vaping devices, such as e-cigarettes. If you need help quitting, ask your health care provider. Keep all follow-up visits. This is important. Contact a health care provider if: You have redness or pressure sores on your head, face, mouth, or nose from the mask or head gear. You have trouble using the CPAP or BIPAP  machine. You cannot tolerate wearing the CPAP or BIPAP mask. Someone tells you that you snore even when wearing your CPAP or BIPAP. Get help right away if: You have trouble breathing. You feel confused. Summary CPAP and BIPAP are methods that use air pressure to keep your airways open and to help you breathe well. If you have trouble with the mask, it is very important that you talk with your health care provider about finding a way to make the mask easier to tolerate. Do not stop using the mask. There could be a negative impact to your health if you stop using the mask. Follow instructions from your health care provider about when to use the machine. This information is not intended to replace advice given to you by your health care provider. Make sure you discuss any questions you have with your health care provider. Document Revised: 05/26/2021 Document Reviewed: 09/25/2020 Elsevier Patient Education  [place].

## 2022-04-13 NOTE — Assessment & Plan Note (Signed)
-   Patient is 97% compliant with CPAP use, average 4 hours 50 minutes. Current pressure settings 5 to 15 cm H2O; residual AHI 4.1/hr.  He reports benefit from use.  We will place an order for patient to receive new CPAP machine, he would like to change DME companies to [place]. Changing pressure to 5-12cm h20 d/t airleaks and comfort. Follow-up in 1 year or sooner if needed.

## 2022-04-13 NOTE — Progress Notes (Signed)
$'@Patient'g$  ID: Francisco Pena, male    DOB: Sep 18, 1966, 56 y.o.   MRN: [id number]  Chief Complaint  Patient presents with   Follow-up    Requests new CPAP machine.     Referring provider: [name], NP  HPI: 56 year old never smoked.  Past medical history significant for obstructive sleep apnea.  Patient of Dr. Annamaria Boots, last seen in office on 05/24/2021.  04/13/2022 Patient presents today for follow-up OSA. Requesting new CPAP machine, he would like to change DME company from [facility] to different one.   Currently not taking anything for sleep. He has been on Xanax in the past to help with sleep.  He tells me he feels more rested and is sleeping better since starting CPAP therapy. No issues with mask or pressure setting. He uses a nasal mask. No significant daytime sleepiness.   Airview download 03/13/2022 - 04/11/2022 Usage 29/30 days (97%); 20 days (67%) greater than 4 hours Average usage 4 hours 50 minutes Pressure 5 to 15 cm H2O (7.9 cm H2O-5%) Air leaks 38.4 L/min (95 percentile) AHI 4.1  Allergies  Allergen Reactions   Augmentin [Amoxicillin-Pot Clavulanate] Diarrhea    Immunization History  Administered Date(s) Administered   Influenza Inj Mdck Quad Pf 09/28/2018, 08/02/2019   Influenza,inj,Quad PF,6+ Mos 08/01/2014, 07/26/2017, 07/25/2018, 08/02/2019, 09/27/2020   Influenza,inj,quad, With Preservative 08/04/2016   Influenza-Unspecified 07/31/2021   PFIZER(Purple Top)SARS-COV-2 Vaccination 09/27/2020   Tdap 11/05/2015   Zoster Recombinat (Shingrix) 09/27/2020    Past Medical History:  Diagnosis Date   Allergy    Anxiety and depression    Atypical mole 06/11/2010   Lower Mid Back (mild to moderate)   Atypical mole 06/11/2010   Right Abdomen (mild)   Heart palpitations    Hyperlipidemia    Prostate infection    Sleep apnea    CPAP     Tobacco History: Social History   Tobacco Use  Smoking Status Never  Smokeless Tobacco Never   Counseling given: Not  Answered   Outpatient Medications Prior to Visit  Medication Sig Dispense Refill   albuterol (VENTOLIN HFA) 108 (90 Base) MCG/ACT inhaler Inhale 2 puffs into the lungs every 6 (six) hours as needed for wheezing or shortness of breath. 8 g 0   atorvastatin (LIPITOR) 20 MG tablet Take 1 tablet (20 mg total) by mouth daily. 90 tablet 1   cetirizine (ZYRTEC) 10 MG tablet Take 1 tablet (10 mg total) by mouth daily. (Patient taking differently: Take 10 mg by mouth daily as needed.) 30 tablet 11   gabapentin (NEURONTIN) 300 MG capsule Take 2 capsules (600 mg total) by mouth at bedtime. 180 capsule 1   metoprolol succinate (TOPROL-XL) 25 MG 24 hr tablet Take 0.5 tablets (12.5 mg total) by mouth daily. 45 tablet 3   Multiple Vitamin (MULTIVITAMIN) tablet Take 1 tablet by mouth daily.     sertraline (ZOLOFT) 100 MG tablet TAKE 1.5 TABLETS ('150MG'$  TOTAL) BY MOUTH DAILY 135 tablet 1   valACYclovir (VALTREX) 500 MG tablet As needed     ALPRAZolam (XANAX) 0.5 MG tablet Take 0.5 tablets (0.25 mg total) by mouth 2 (two) times daily as needed for anxiety. 20 tablet 0   azelastine (ASTELIN) 0.1 % nasal spray Place 1 spray into both nostrils 2 (two) times daily. Use in each nostril as directed 30 mL 12   DEXILANT 60 MG capsule Take 1 capsule by mouth every morning.     methocarbamol (ROBAXIN) 500 MG tablet Take 1 tablet (500  mg total) by mouth every 8 (eight) hours as needed for muscle spasms. 30 tablet 0   montelukast (SINGULAIR) 10 MG tablet Take 1 tablet (10 mg total) by mouth at bedtime. 30 tablet 3   No facility-administered medications prior to visit.   Review of Systems  Review of Systems  Constitutional: Negative.   HENT: Negative.    Respiratory: Negative.    Cardiovascular: Negative.    Physical Exam  BP 120/76 (BP Location: Right Arm)   Pulse 79   Temp 98.1 F (36.7 C) (Oral)   Ht '6\' 2"'$  (1.88 m)   Wt 224 lb 3.2 oz (101.7 kg)   SpO2 97% Comment: on RA  BMI 28.79 kg/m  Physical  Exam Constitutional:      Appearance: Normal appearance.  HENT:     Head: Normocephalic and atraumatic.     Mouth/Throat:     Mouth: Mucous membranes are moist.     Pharynx: Oropharynx is clear.  Cardiovascular:     Rate and Rhythm: Normal rate and regular rhythm.  Pulmonary:     Effort: Pulmonary effort is normal.     Breath sounds: Normal breath sounds. No wheezing, rhonchi or rales.  Skin:    General: Skin is warm and dry.  Neurological:     General: No focal deficit present.     Mental Status: He is alert and oriented to person, place, and time. Mental status is at baseline.  Psychiatric:        Mood and Affect: Mood normal.        Behavior: Behavior normal.        Thought Content: Thought content normal.        Judgment: Judgment normal.      Lab Results:  CBC    Component Value Date/Time   WBC 5.0 09/22/2021 1038   RBC 4.97 09/22/2021 1038   HGB 14.2 09/22/2021 1038   HCT 42.8 09/22/2021 1038   PLT 176.0 09/22/2021 1038   MCV 86.1 09/22/2021 1038   MCH 29.3 08/17/2018 1632   MCHC 33.2 09/22/2021 1038   RDW 13.4 09/22/2021 1038   LYMPHSABS 1.4 09/22/2021 1038   MONOABS 0.5 09/22/2021 1038   EOSABS 0.1 09/22/2021 1038   BASOSABS 0.0 09/22/2021 1038    BMET    Component Value Date/Time   NA 141 09/22/2021 1038   K 4.3 09/22/2021 1038   CL 105 09/22/2021 1038   CO2 29 09/22/2021 1038   GLUCOSE 89 09/22/2021 1038   BUN 18 09/22/2021 1038   CREATININE 1.15 09/22/2021 1038   CREATININE 1.30 08/17/2018 1632   CALCIUM 9.4 09/22/2021 1038   GFRNONAA 71 (L) 02/22/2013 0001   GFRAA 82 (L) 02/22/2013 0001    BNP No results found for: "BNP"  ProBNP No results found for: "PROBNP"  Imaging: No results found.   Assessment & Plan:   Obstructive sleep apnea - Patient is 97% compliant with CPAP use, average 4 hours 50 minutes. Current pressure settings 5 to 15 cm H2O; residual AHI 4.1/hr.  He reports benefit from use.  We will place an order for patient to  receive new CPAP machine, he would like to change DME companies to [place]. Changing pressure to 5-12cm h20 d/t airleaks and comfort. Follow-up in 1 year or sooner if needed.    Martyn Ehrich, NP 04/13/2022

## 2022-04-26 ENCOUNTER — Encounter: Payer: Self-pay | Admitting: Registered Nurse

## 2022-04-27 ENCOUNTER — Other Ambulatory Visit: Payer: Self-pay | Admitting: Registered Nurse

## 2022-04-27 DIAGNOSIS — G479 Sleep disorder, unspecified: Secondary | ICD-10-CM

## 2022-04-27 MED ORDER — TRAZODONE HCL 50 MG PO TABS: 25.0000 mg | ORAL_TABLET | Freq: Every evening | ORAL | 3 refills | Status: DC | PRN

## 2022-05-11 ENCOUNTER — Telehealth: Payer: Self-pay | Admitting: Internal Medicine

## 2022-05-11 NOTE — Telephone Encounter (Signed)
Called and spoke with [facility] and they are going to pull the order to see what the issues are and will contact patient in regards to this cpap order. Spoke to a woman named Museum/gallery conservator, if I heard her correctly. Nothing further needed

## 2022-05-21 ENCOUNTER — Emergency Department (HOSPITAL_BASED_OUTPATIENT_CLINIC_OR_DEPARTMENT_OTHER): Payer: BC Managed Care – PPO

## 2022-05-21 ENCOUNTER — Other Ambulatory Visit: Payer: Self-pay

## 2022-05-21 ENCOUNTER — Encounter (HOSPITAL_BASED_OUTPATIENT_CLINIC_OR_DEPARTMENT_OTHER): Payer: Self-pay | Admitting: Emergency Medicine

## 2022-05-21 ENCOUNTER — Observation Stay (HOSPITAL_BASED_OUTPATIENT_CLINIC_OR_DEPARTMENT_OTHER)
Admission: EM | Admit: 2022-05-21 | Discharge: 2022-05-25 | Disposition: A | Discharge status: Home / Self Care | Payer: BC Managed Care – PPO | Attending: Emergency Medicine | Admitting: Emergency Medicine

## 2022-05-21 DIAGNOSIS — K219 Gastro-esophageal reflux disease without esophagitis: Secondary | ICD-10-CM | POA: Diagnosis present

## 2022-05-21 DIAGNOSIS — E785 Hyperlipidemia, unspecified: Secondary | ICD-10-CM | POA: Diagnosis present

## 2022-05-21 DIAGNOSIS — Z8249 Family history of ischemic heart disease and other diseases of the circulatory system: Secondary | ICD-10-CM

## 2022-05-21 DIAGNOSIS — G4733 Obstructive sleep apnea (adult) (pediatric): Secondary | ICD-10-CM | POA: Diagnosis not present

## 2022-05-21 DIAGNOSIS — Z803 Family history of malignant neoplasm of breast: Secondary | ICD-10-CM | POA: Diagnosis not present

## 2022-05-21 DIAGNOSIS — Z79899 Other long term (current) drug therapy: Secondary | ICD-10-CM | POA: Diagnosis not present

## 2022-05-21 DIAGNOSIS — K56609 Unspecified intestinal obstruction, unspecified as to partial versus complete obstruction: Principal | ICD-10-CM | POA: Diagnosis present

## 2022-05-21 DIAGNOSIS — K5669 Other partial intestinal obstruction: Principal | ICD-10-CM | POA: Diagnosis present

## 2022-05-21 DIAGNOSIS — R109 Unspecified abdominal pain: Secondary | ICD-10-CM | POA: Diagnosis not present

## 2022-05-21 DIAGNOSIS — F419 Anxiety disorder, unspecified: Secondary | ICD-10-CM | POA: Diagnosis present

## 2022-05-21 DIAGNOSIS — I10 Essential (primary) hypertension: Secondary | ICD-10-CM | POA: Diagnosis not present

## 2022-05-21 DIAGNOSIS — K529 Noninfective gastroenteritis and colitis, unspecified: Secondary | ICD-10-CM | POA: Diagnosis present

## 2022-05-21 DIAGNOSIS — R1013 Epigastric pain: Secondary | ICD-10-CM | POA: Diagnosis present

## 2022-05-21 DIAGNOSIS — F39 Unspecified mood [affective] disorder: Secondary | ICD-10-CM | POA: Diagnosis present

## 2022-05-21 LAB — COMPREHENSIVE METABOLIC PANEL
ALT: 29 U/L (ref 0–44)
AST: 24 U/L (ref 15–41)
Albumin: 4.8 g/dL (ref 3.5–5.0)
Alkaline Phosphatase: 62 U/L (ref 38–126)
Anion gap: 11 (ref 5–15)
BUN: 17 mg/dL (ref 6–20)
CO2: 27 mmol/L (ref 22–32)
Calcium: 10 mg/dL (ref 8.9–10.3)
Chloride: 102 mmol/L (ref 98–111)
Creatinine, Ser: 1.1 mg/dL (ref 0.61–1.24)
GFR, Estimated: >60 mL/min (ref >60)
Glucose, Bld: 116 mg/dL — ABNORMAL HIGH (ref 70–99)
Potassium: 3.9 mmol/L (ref 3.5–5.1)
Sodium: 140 mmol/L (ref 135–145)
Total Bilirubin: 1.8 mg/dL — ABNORMAL HIGH (ref 0.3–1.2)
Total Protein: 7.1 g/dL (ref 6.5–8.1)

## 2022-05-21 LAB — URINALYSIS, ROUTINE W REFLEX MICROSCOPIC
Bilirubin Urine: NEGATIVE
Glucose, UA: NEGATIVE mg/dL
Hgb urine dipstick: NEGATIVE
Ketones, ur: NEGATIVE mg/dL
Leukocytes,Ua: NEGATIVE
Nitrite: NEGATIVE
Protein, ur: 30 mg/dL — AB
Specific Gravity, Urine: 1.021 (ref 1.005–1.030)
pH: 8.5 — ABNORMAL HIGH (ref 5.0–8.0)

## 2022-05-21 LAB — CBC WITH DIFFERENTIAL/PLATELET
Abs Immature Granulocytes: 0.02 10*3/uL (ref 0.00–0.07)
Basophils Absolute: 0 10*3/uL (ref 0.0–0.1)
Basophils Relative: 0 %
Eosinophils Absolute: 0.1 10*3/uL (ref 0.0–0.5)
Eosinophils Relative: 1 %
HCT: 46.4 % (ref 39.0–52.0)
Hemoglobin: 16.1 g/dL (ref 13.0–17.0)
Immature Granulocytes: 0 %
Lymphocytes Relative: 15 %
Lymphs Abs: 1.5 10*3/uL (ref 0.7–4.0)
MCH: 29.7 pg (ref 26.0–34.0)
MCHC: 34.7 g/dL (ref 30.0–36.0)
MCV: 85.5 fL (ref 80.0–100.0)
Monocytes Absolute: 0.7 10*3/uL (ref 0.1–1.0)
Monocytes Relative: 7 %
Neutro Abs: 8 10*3/uL — ABNORMAL HIGH (ref 1.7–7.7)
Neutrophils Relative %: 77 %
Platelets: 201 10*3/uL (ref 150–400)
RBC: 5.43 MIL/uL (ref 4.22–5.81)
RDW: 12.5 % (ref 11.5–15.5)
WBC: 10.4 10*3/uL (ref 4.0–10.5)
nRBC: 0 % (ref 0.0–0.2)

## 2022-05-21 LAB — LIPASE, BLOOD: Lipase: 13 U/L (ref 11–51)

## 2022-05-21 MED ORDER — LACTATED RINGERS IV SOLN: INTRAVENOUS | Status: DC

## 2022-05-21 MED ORDER — GABAPENTIN 300 MG PO CAPS
600.0000 mg | ORAL_CAPSULE | Freq: Every day | ORAL | Status: DC
  Administered 2022-05-21 – 2022-05-24 (×4): 600 mg via ORAL
  Filled 2022-05-21 (×4): qty 2

## 2022-05-21 MED ORDER — METOPROLOL SUCCINATE ER 25 MG PO TB24
12.5000 mg | ORAL_TABLET | Freq: Every day | ORAL | Status: DC
  Administered 2022-05-21 – 2022-05-24 (×4): 12.5 mg via ORAL
  Filled 2022-05-21 (×4): qty 1

## 2022-05-21 MED ORDER — ACETAMINOPHEN 325 MG PO TABS
650.0000 mg | ORAL_TABLET | Freq: Four times a day (QID) | ORAL | Status: DC | PRN
  Administered 2022-05-22 – 2022-05-24 (×3): 650 mg via ORAL
  Filled 2022-05-21 (×3): qty 2

## 2022-05-21 MED ORDER — PANTOPRAZOLE SODIUM 40 MG IV SOLR
40.0000 mg | Freq: Once | INTRAVENOUS | Status: AC
  Administered 2022-05-21: 40 mg via INTRAVENOUS
  Filled 2022-05-21: qty 10

## 2022-05-21 MED ORDER — BENZOCAINE 20 % MT AERO
INHALATION_SPRAY | Freq: Once | OROMUCOSAL | Status: DC
  Filled 2022-05-21: qty 57

## 2022-05-21 MED ORDER — ADULT MULTIVITAMIN W/MINERALS CH
1.0000 | ORAL_TABLET | Freq: Every day | ORAL | Status: DC
  Administered 2022-05-22 – 2022-05-25 (×4): 1 via ORAL
  Filled 2022-05-21 (×4): qty 1

## 2022-05-21 MED ORDER — ONDANSETRON HCL 4 MG/2ML IJ SOLN
INTRAMUSCULAR | Status: AC
  Administered 2022-05-21: 4 mg
  Filled 2022-05-21: qty 2

## 2022-05-21 MED ORDER — ATORVASTATIN CALCIUM 20 MG PO TABS
20.0000 mg | ORAL_TABLET | Freq: Every day | ORAL | Status: DC
  Administered 2022-05-22 – 2022-05-25 (×4): 20 mg via ORAL
  Filled 2022-05-21 (×4): qty 1

## 2022-05-21 MED ORDER — ONDANSETRON HCL 4 MG/2ML IJ SOLN
4.0000 mg | Freq: Once | INTRAMUSCULAR | Status: DC
  Filled 2022-05-21: qty 2

## 2022-05-21 MED ORDER — ACETAMINOPHEN 650 MG RE SUPP: 650.0000 mg | Freq: Four times a day (QID) | RECTAL | Status: DC | PRN

## 2022-05-21 MED ORDER — ENOXAPARIN SODIUM 40 MG/0.4ML IJ SOSY
40.0000 mg | PREFILLED_SYRINGE | INTRAMUSCULAR | Status: DC
Start: 2022-05-22 — End: 2022-05-25
  Administered 2022-05-22 – 2022-05-24 (×3): 40 mg via SUBCUTANEOUS
  Filled 2022-05-21 (×4): qty 0.4

## 2022-05-21 MED ORDER — ONDANSETRON HCL 4 MG/2ML IJ SOLN
4.0000 mg | Freq: Four times a day (QID) | INTRAMUSCULAR | Status: DC | PRN
  Administered 2022-05-21 – 2022-05-24 (×4): 4 mg via INTRAVENOUS
  Filled 2022-05-21 (×4): qty 2

## 2022-05-21 MED ORDER — SERTRALINE HCL 50 MG PO TABS
150.0000 mg | ORAL_TABLET | Freq: Every day | ORAL | Status: DC
  Administered 2022-05-22 – 2022-05-25 (×4): 150 mg via ORAL
  Filled 2022-05-21 (×4): qty 1

## 2022-05-21 MED ORDER — IOHEXOL 300 MG/ML  SOLN
100.0000 mL | Freq: Once | INTRAMUSCULAR | Status: AC | PRN
  Administered 2022-05-21: 100 mL via INTRAVENOUS

## 2022-05-21 MED ORDER — LACTATED RINGERS IV BOLUS
1000.0000 mL | Freq: Once | INTRAVENOUS | Status: AC
  Administered 2022-05-21: 1000 mL via INTRAVENOUS

## 2022-05-21 MED ORDER — ONDANSETRON HCL 4 MG PO TABS: 4.0000 mg | ORAL_TABLET | Freq: Four times a day (QID) | ORAL | Status: DC | PRN

## 2022-05-21 MED ORDER — LORAZEPAM 2 MG/ML IJ SOLN
1.0000 mg | Freq: Once | INTRAMUSCULAR | Status: AC
  Administered 2022-05-21: 1 mg via INTRAVENOUS
  Filled 2022-05-21: qty 1

## 2022-05-21 MED ORDER — TRAZODONE HCL 50 MG PO TABS
25.0000 mg | ORAL_TABLET | Freq: Every evening | ORAL | Status: DC | PRN
  Administered 2022-05-21: 25 mg via ORAL
  Filled 2022-05-21: qty 1

## 2022-05-21 NOTE — Assessment & Plan Note (Signed)
Cont home BP meds when med rec complete.

## 2022-05-21 NOTE — ED Provider Notes (Signed)
[facility] EMERGENCY DEPT Provider Note   CSN: [id number] Arrival date & time: 05/21/22  1618     History  Chief Complaint  Patient presents with   Abdominal Pain    Francisco Pena is a 56 y.o. male.  Patient is a 56 year old male who presents with abdominal pain and nausea.  He said he started feeling bad yesterday with a headache and nausea.  He has had progressive pain in his abdomen.  He said it started lower and now its more in the top part of his abdomen.  He denies any vomiting.  He has had some loose stools.  No fevers.  No urinary symptoms.  He does have a history of GERD and is followed by Dr. Benson Norway.       Home Medications Prior to Admission medications   Medication Sig Start Date End Date Taking? Authorizing Provider  albuterol (VENTOLIN HFA) 108 (90 Base) MCG/ACT inhaler Inhale 2 puffs into the lungs every 6 (six) hours as needed for wheezing or shortness of breath. 03/11/21   [name], NP  atorvastatin (LIPITOR) 20 MG tablet Take 1 tablet (20 mg total) by mouth daily. 03/10/22   [name], NP  cetirizine (ZYRTEC) 10 MG tablet Take 1 tablet (10 mg total) by mouth daily. Patient taking differently: Take 10 mg by mouth daily as needed. 01/05/22   [name], NP  gabapentin (NEURONTIN) 300 MG capsule Take 2 capsules (600 mg total) by mouth at bedtime. 03/10/22   [name], NP  metoprolol succinate (TOPROL-XL) 25 MG 24 hr tablet Take 0.5 tablets (12.5 mg total) by mouth daily. 03/10/22   [name], NP  Multiple Vitamin (MULTIVITAMIN) tablet Take 1 tablet by mouth daily.    [provider]  sertraline (ZOLOFT) 100 MG tablet TAKE 1.5 TABLETS ('150MG'$  TOTAL) BY MOUTH DAILY 03/10/22   [name], NP  traZODone (DESYREL) 50 MG tablet Take 0.5-1 tablets (25-50 mg total) by mouth at bedtime as needed for sleep. 04/27/22   [name], NP  valACYclovir (VALTREX) 500 MG tablet As needed    [provider]      Allergies     Augmentin [amoxicillin-pot clavulanate]    Review of Systems   Review of Systems  Constitutional:  Negative for chills, diaphoresis, fatigue and fever.  HENT:  Negative for congestion, rhinorrhea and sneezing.   Eyes: Negative.   Respiratory:  Negative for cough, chest tightness and shortness of breath.   Cardiovascular:  Negative for chest pain and leg swelling.  Gastrointestinal:  Positive for abdominal pain, diarrhea and nausea. Negative for blood in stool and vomiting.  Genitourinary:  Negative for difficulty urinating, flank pain, frequency and hematuria.  Musculoskeletal:  Negative for arthralgias and back pain.  Skin:  Negative for rash.  Neurological:  Positive for headaches. Negative for dizziness, speech difficulty, weakness and numbness.    Physical Exam Updated Vital Signs BP 136/76   Pulse 63   Temp (!) 97.4 F (36.3 C)   Resp 18   Ht '6\' 2"'$  (1.88 m)   Wt 99.8 kg   SpO2 97%   BMI 28.25 kg/m  Physical Exam Constitutional:      Appearance: He is well-developed.  HENT:     Head: Normocephalic and atraumatic.  Eyes:     Pupils: Pupils are equal, round, and reactive to light.  Cardiovascular:     Rate and Rhythm: Normal rate and regular rhythm.     Heart sounds: Normal heart sounds.  Pulmonary:  Effort: Pulmonary effort is normal. No respiratory distress.     Breath sounds: Normal breath sounds. No wheezing or rales.  Chest:     Chest wall: No tenderness.  Abdominal:     General: Bowel sounds are normal.     Palpations: Abdomen is soft.     Tenderness: There is abdominal tenderness in the epigastric area and periumbilical area. There is no guarding or rebound.  Musculoskeletal:        General: Normal range of motion.     Cervical back: Normal range of motion and neck supple.  Lymphadenopathy:     Cervical: No cervical adenopathy.  Skin:    General: Skin is warm and dry.     Findings: No rash.  Neurological:     Mental Status: He is alert and  oriented to person, place, and time.     ED Results / Procedures / Treatments   Labs (all labs ordered are listed, but only abnormal results are displayed) Labs Reviewed  COMPREHENSIVE METABOLIC PANEL - Abnormal; Notable for the following components:      Result Value   Glucose, Bld 116 (*)    Total Bilirubin 1.8 (*)    All other components within normal limits  CBC WITH DIFFERENTIAL/PLATELET - Abnormal; Notable for the following components:   Neutro Abs 8.0 (*)    All other components within normal limits  URINALYSIS, ROUTINE W REFLEX MICROSCOPIC - Abnormal; Notable for the following components:   pH 8.5 (*)    Protein, ur 30 (*)    All other components within normal limits  LIPASE, BLOOD    EKG None  Radiology CT Abdomen Pelvis W Contrast  Result Date: 05/21/2022 CLINICAL DATA:  Abdominal pain, acute, nonlocalized. Abdominal pain that started last night; n/v/d , hx of abd problems EXAM: CT ABDOMEN AND PELVIS WITH CONTRAST TECHNIQUE: Multidetector CT imaging of the abdomen and pelvis was performed using the standard protocol following bolus administration of intravenous contrast. RADIATION DOSE REDUCTION: This exam was performed according to the departmental dose-optimization program which includes automated exposure control, adjustment of the mA and/or kV according to patient size and/or use of iterative reconstruction technique. CONTRAST:  151m OMNIPAQUE IOHEXOL 300 MG/ML  SOLN COMPARISON:  CT abdomen pelvis 02/22/2013 FINDINGS: Lower chest: No acute abnormality. Hepatobiliary: No focal liver abnormality. No gallstones, gallbladder wall thickening, or pericholecystic fluid. No biliary dilatation. Pancreas: No focal lesion. Normal pancreatic contour. No surrounding inflammatory changes. No main pancreatic ductal dilatation. Spleen: Normal in size without focal abnormality. Adrenals/Urinary Tract: No adrenal nodule bilaterally. Bilateral kidneys enhance symmetrically. Subcentimeter  hypodensity too small to characterize. No hydronephrosis. No hydroureter. The urinary bladder is unremarkable. On delayed imaging, there is no urothelial wall thickening and there are no filling defects in the opacified portions of the bilateral collecting systems or ureters. Stomach/Bowel: Stomach is within normal limits. Several loops of small bowel within the mid anterior abdomen are noted to be distended with fluid as well as fecalized material. Query developing transition point within the right mid abdomen (6:64). Associated slight congestion of the mesenteric vasculature with trace free fluid. No evidence of large bowel wall thickening or dilatation. Appendix appears normal. Vascular/Lymphatic: No abdominal aorta or iliac aneurysm. Mild atherosclerotic plaque of the aorta and its branches. No abdominal, pelvic, or inguinal lymphadenopathy. Reproductive: Prostate is unremarkable. Other: Trace free fluid within the mid abdomen. Trace free fluid within the pelvis. No intraperitoneal free gas. No organized fluid collection. Musculoskeletal: No abdominal wall hernia or abnormality.  No suspicious lytic or blastic osseous lesions. No acute displaced fracture. Multilevel degenerative changes of the spine. IMPRESSION: 1. Findings suggestive of a partial or early small-bowel obstruction. Associated slight congestion of the mesenteric vasculature and trace free fluid. Likely developing transition point within the right mid abdomen. No pneumatosis or bowel perforation. 2.  Aortic Atherosclerosis (ICD10-I70.0). Electronically Signed   By: Iven Finn M.D.   On: 05/21/2022 19:40    Procedures Procedures    Medications Ordered in ED Medications  ondansetron (ZOFRAN) injection 4 mg (4 mg Intravenous Not Given 05/21/22 1710)  LORazepam (ATIVAN) injection 1 mg (has no administration in time range)  ondansetron (ZOFRAN) 4 MG/2ML injection (4 mg  Given 05/21/22 1638)  lactated ringers bolus 1,000 mL (1,000 mLs  Intravenous New Bag/Given 05/21/22 1847)  pantoprazole (PROTONIX) injection 40 mg (40 mg Intravenous Given 05/21/22 1845)  iohexol (OMNIPAQUE) 300 MG/ML solution 100 mL (100 mLs Intravenous Contrast Given 05/21/22 1908)    ED Course/ Medical Decision Making/ A&P                           Medical Decision Making Amount and/or Complexity of Data Reviewed Labs: ordered. Radiology: ordered.  Risk Prescription drug management. Decision regarding hospitalization.   Patient is a 56 year old male who presents with abdominal discomfort associated with nausea.  He has a little bit of abdominal bloating.  His abdomen is tender more on the right side but also kind of in the epigastric and periumbilic all area.  His labs are nonconcerning.  No evidence of pancreatitis.  His LFTs are normal.  He is not specifically tender over his appendix.  CT scan was performed which shows evidence of a early/partial small bowel obstruction with a focal transition point.  He does not have any prior history of abdominal surgeries.  I spoke with Dr. Barry Dienes who will follow the patient along as a consult.  I spoke with Dr. Myna Hidalgo who will admit the patient.  He had an NG tube placed.  He was given IV fluids as well as antiemetics and medication for pain.  Will be admitted for further treatment.  Final Clinical Impression(s) / ED Diagnoses Final diagnoses:  SBO (small bowel obstruction) [facility])    Rx / DC Orders ED Discharge Orders     None         Malvin Johns, MD 05/21/22 2046

## 2022-05-21 NOTE — Assessment & Plan Note (Addendum)
Pt with early SBO vs PSBO on CT scan. Particularly concerning as pt has no h/o abdominal surgeries. Also concerned because for some reason, GI did a colonoscopy on patient 10 years earlier than usual. 1. Gen surg consult 2. NPO 3. IVF 4. NGT

## 2022-05-21 NOTE — ED Notes (Signed)
Attempted NG x 2, left nare had resistance while attempting to insert, pt nose began to bleed. Sts he had past nasal surgery on that side. Attempted right nare and NG inserted without difficulty but pt pulled tube out. IP RN notified and aware.

## 2022-05-21 NOTE — ED Triage Notes (Signed)
Pt here with c/o abd pain that started last night some n/v/d , hx of abd problems

## 2022-05-22 DIAGNOSIS — K56609 Unspecified intestinal obstruction, unspecified as to partial versus complete obstruction: Secondary | ICD-10-CM | POA: Diagnosis not present

## 2022-05-22 DIAGNOSIS — I10 Essential (primary) hypertension: Secondary | ICD-10-CM

## 2022-05-22 LAB — CBC
HCT: 41.1 % (ref 39.0–52.0)
Hemoglobin: 13.9 g/dL (ref 13.0–17.0)
MCH: 30 pg (ref 26.0–34.0)
MCHC: 33.8 g/dL (ref 30.0–36.0)
MCV: 88.8 fL (ref 80.0–100.0)
Platelets: 152 10*3/uL (ref 150–400)
RBC: 4.63 MIL/uL (ref 4.22–5.81)
RDW: 12.6 % (ref 11.5–15.5)
WBC: 8.6 10*3/uL (ref 4.0–10.5)
nRBC: 0 % (ref 0.0–0.2)

## 2022-05-22 LAB — BASIC METABOLIC PANEL
Anion gap: 7 (ref 5–15)
BUN: 15 mg/dL (ref 6–20)
CO2: 26 mmol/L (ref 22–32)
Calcium: 8.5 mg/dL — ABNORMAL LOW (ref 8.9–10.3)
Chloride: 108 mmol/L (ref 98–111)
Creatinine, Ser: 1.03 mg/dL (ref 0.61–1.24)
GFR, Estimated: >60 mL/min (ref >60)
Glucose, Bld: 104 mg/dL — ABNORMAL HIGH (ref 70–99)
Potassium: 4 mmol/L (ref 3.5–5.1)
Sodium: 141 mmol/L (ref 135–145)

## 2022-05-22 LAB — HIV ANTIBODY (ROUTINE TESTING W REFLEX): HIV Screen 4th Generation wRfx: NONREACTIVE

## 2022-05-22 MED ORDER — ZOLPIDEM TARTRATE 5 MG PO TABS
5.0000 mg | ORAL_TABLET | Freq: Every evening | ORAL | Status: DC | PRN
  Administered 2022-05-22 – 2022-05-23 (×3): 5 mg via ORAL
  Filled 2022-05-22 (×4): qty 1

## 2022-05-22 NOTE — H&P (Signed)
History and Physical    Patient: Francisco Pena Francisco Pena DOB: Jan 01, 1966 DOA: 05/21/2022 DOS: the patient was seen and examined on 05/22/2022 PCP: [name], NP  Patient coming from: Home  Chief Complaint:  Chief Complaint  Patient presents with   Abdominal Pain   HPI: Francisco Pena is a 56 y.o. male with medical history significant of HLD.  No FHx of colon cancer.  Brother (twin) had colon polyps ~ age 74, so pt had first colonoscopy at age 69 no polyps, normal per pt, repeat colonoscopy at age 3 or 58 (2-3 years ago) also normal per pt.  Pt has never had any abdominal surgeries.  Pt has no known history of IBD (no Crohns nor UC).  Pt presents to ED at [facility] with abd pain and nausea.  Symptoms onset yesterday.  Progressive pain in abdomen.  No vomiting.  Has had some loose stools.  No fever, no urinary symptoms.  Does have h/o GERD.  In ED: CT reveals early SBO / PSBO.  Review of Systems: As mentioned in the history of present illness. All other systems reviewed and are negative. Past Medical History:  Diagnosis Date   Allergy    Anxiety and depression    Atypical mole 06/11/2010   Lower Mid Back (mild to moderate)   Atypical mole 06/11/2010   Right Abdomen (mild)   Heart palpitations    Hyperlipidemia    Prostate infection    Sleep apnea    CPAP    Past Surgical History:  Procedure Laterality Date   CERVICAL SPINE SURGERY     artificial discs in neck   COLONOSCOPY  at age 89    [place]normal exam per pt   SPINE SURGERY Right    disc shaved on right side x2   TONSILLECTOMY     Social History:  reports that he has never smoked. He has never used smokeless tobacco. He reports current alcohol use. He reports that he does not use drugs.  Allergies  Allergen Reactions   Augmentin [Amoxicillin-Pot Clavulanate] Diarrhea    Family History  Problem Relation Age of Onset   Hypertension Mother    Breast cancer Mother    Hypertension Father     Heart disease Father        CHF    Hypertension Sister    Colon polyps Brother    Colon cancer Neg Hx    Esophageal cancer Neg Hx    Rectal cancer Neg Hx    Stomach cancer Neg Hx     Prior to Admission medications   Medication Sig Start Date End Date Taking? Authorizing Provider  atorvastatin (LIPITOR) 20 MG tablet Take 1 tablet (20 mg total) by mouth daily. 03/10/22  Yes [name], NP  gabapentin (NEURONTIN) 300 MG capsule Take 2 capsules (600 mg total) by mouth at bedtime. 03/10/22  Yes [name], NP  metoprolol succinate (TOPROL-XL) 25 MG 24 hr tablet Take 0.5 tablets (12.5 mg total) by mouth daily. Patient taking differently: Take 12.5 mg by mouth at bedtime. 03/10/22  Yes [name], NP  Multiple Vitamin (MULTIVITAMIN) tablet Take 1 tablet by mouth daily.   Yes [provider]  sertraline (ZOLOFT) 100 MG tablet TAKE 1.5 TABLETS ('150MG'$  TOTAL) BY MOUTH DAILY Patient taking differently: Take 150 mg by mouth daily. 03/10/22  Yes [name], NP  traZODone (DESYREL) 50 MG tablet Take 0.5-1 tablets (25-50 mg total) by mouth at bedtime as needed for sleep. 04/27/22  Yes Orland Mustard,  Richard, NP  valACYclovir (VALTREX) 500 MG tablet Take 500 mg by mouth daily. As needed   Yes [provider]    Physical Exam: Vitals:   05/21/22 1800 05/21/22 1830 05/21/22 2030 05/21/22 2203  BP: 137/73 136/76 130/80 (!) 142/72  Pulse: 67 63 74 84  Resp: '18 18 18 18  '$ Temp:    98.7 F (37.1 C)  TempSrc:    Oral  SpO2: 100% 97% 98% 96%  Weight:      Height:       Constitutional: NAD, calm, comfortable Eyes: PERRL, lids and conjunctivae normal ENMT: Mucous membranes are moist. Posterior pharynx clear of any exudate or lesions.Normal dentition.  Neck: normal, supple, no masses, no thyromegaly Respiratory: clear to auscultation bilaterally, no wheezing, no crackles. Normal respiratory effort. No accessory muscle use.  Cardiovascular: Regular rate and rhythm, no murmurs /  rubs / gallops. No extremity edema. 2+ pedal pulses. No carotid bruits.  Abdomen: Epigastric TTP Musculoskeletal: no clubbing / cyanosis. No joint deformity upper and lower extremities. Good ROM, no contractures. Normal muscle tone.  Skin: no rashes, lesions, ulcers. No induration Neurologic: CN 2-12 grossly intact. Sensation intact, DTR normal. Strength 5/5 in all 4.  Psychiatric: Normal judgment and insight. Alert and oriented x 3. Normal mood.   Data Reviewed:    CBC    Component Value Date/Time   WBC 10.4 05/21/2022 1631   RBC 5.43 05/21/2022 1631   HGB 16.1 05/21/2022 1631   HCT 46.4 05/21/2022 1631   PLT 201 05/21/2022 1631   MCV 85.5 05/21/2022 1631   MCH 29.7 05/21/2022 1631   MCHC 34.7 05/21/2022 1631   RDW 12.5 05/21/2022 1631   LYMPHSABS 1.5 05/21/2022 1631   MONOABS 0.7 05/21/2022 1631   EOSABS 0.1 05/21/2022 1631   BASOSABS 0.0 05/21/2022 1631   CMP     Component Value Date/Time   NA 140 05/21/2022 1631   K 3.9 05/21/2022 1631   CL 102 05/21/2022 1631   CO2 27 05/21/2022 1631   GLUCOSE 116 (H) 05/21/2022 1631   BUN 17 05/21/2022 1631   CREATININE 1.10 05/21/2022 1631   CREATININE 1.30 08/17/2018 1632   CALCIUM 10.0 05/21/2022 1631   PROT 7.1 05/21/2022 1631   ALBUMIN 4.8 05/21/2022 1631   AST 24 05/21/2022 1631   ALT 29 05/21/2022 1631   ALKPHOS 62 05/21/2022 1631   BILITOT 1.8 (H) 05/21/2022 1631   GFRNONAA >60 05/21/2022 1631   GFRAA 82 (L) 02/22/2013 0001   CT AP: IMPRESSION: 1. Findings suggestive of a partial or early small-bowel obstruction. Associated slight congestion of the mesenteric vasculature and trace free fluid. Likely developing transition point within the right mid abdomen. No pneumatosis or bowel perforation. 2.  Aortic Atherosclerosis (ICD10-I70.0).  Assessment and Plan: * SBO (small bowel obstruction) (HCC) Pt with early SBO vs PSBO on CT scan. A bit more concerning than usual as pt has no h/o abdominal surgeries, nor h/o  adhesion forming diseases (Crohn's or UC).  Thus I questioned occult neoplasm as possible cause.  Though no other evidence of neoplasm on CT scan. Gen surg consult Spoke with Dr. Georgette Dover who was less concerned, he thinks that the most likely explanation is that patients PSBO is secondary to enteritis (likely infectious). He does not feel that we need to get GI involved at this point, or pursue further neoplastic work up right now. Infectious enteritis does sound like a reasonable alternative explanation to neoplasm, though most episodes of infectious gastroenteritis dont  cause SBO, I certainly have personally seen infectious gastroenteritis cause SBO before. Did communicate above with patient. NPO IVF Unable to tolerate NGT Dr. Georgette Dover felt it was reasonable to hold off on NGT Zofran PRN Gen surg will see in hospital tomorrow.  Essential (primary) hypertension Cont home BP meds when med rec complete.      Advance Care Planning:   Code Status: Full Code  Consults: Dr. Georgette Dover  Family Communication: Wife at bedside  Severity of Illness: The appropriate patient status for this patient is INPATIENT. Inpatient status is judged to be reasonable and necessary in order to provide the required intensity of service to ensure the patient's safety. The patient's presenting symptoms, physical exam findings, and initial radiographic and laboratory data in the context of their chronic comorbidities is felt to place them at high risk for further clinical deterioration. Furthermore, it is not anticipated that the patient will be medically stable for discharge from the hospital within 2 midnights of admission.   * I certify that at the point of admission it is my clinical judgment that the patient will require inpatient hospital care spanning beyond 2 midnights from the point of admission due to high intensity of service, high risk for further deterioration and high frequency of surveillance  required.*  Author: Etta Quill., DO 05/22/2022 2:07 AM  For on call review [email or web address].

## 2022-05-22 NOTE — Progress Notes (Signed)
Patient admitted early this morning for pSBO, detail please see HPI. history of hypertension hyperlipidemia, OSA presented to hospital with sudden onset of abdominal pain ,nausea and vomiting after returning from a trip to the beach, denies consuming sea food. No fever. CT abdomen concerning of partial small bowel obstruction,  vital signs are stable , labs are unremarkable .he is made n.p.o., started on IV hydration.  Appear not able to tolerate NG tube he pulled it out last night.  Currently he does not appear in any distress , continue feeling nauseous , no vomiting, no bm General surgery consulted

## 2022-05-22 NOTE — Consult Note (Signed)
CC: abd pain, nausea, diarrhea  Requesting provider: Dr Hurley Cisco  HPI: Francisco Pena is an 56 y.o. male who is here for abdominal pain and diarrhea.  Patient states he was in his usual state of health on vacation [place] the beach but began to develop significant abdominal pain and nausea just before and during his drive back home.  He had to stop several times due to diarrhea.  No vomiting occurred.  Pain became significantly worse to the point where the patient could not tolerate a diet and therefore he presented to the emergency department for further evaluation.  Past Medical History:  Diagnosis Date   Allergy    Anxiety and depression    Atypical mole 06/11/2010   Lower Mid Back (mild to moderate)   Atypical mole 06/11/2010   Right Abdomen (mild)   Heart palpitations    Hyperlipidemia    Prostate infection    Sleep apnea    CPAP     Past Surgical History:  Procedure Laterality Date   CERVICAL SPINE SURGERY     artificial discs in neck   COLONOSCOPY  at age 60    in [place] exam per pt   SPINE SURGERY Right    disc shaved on right side x2   TONSILLECTOMY      Family History  Problem Relation Age of Onset   Hypertension Mother    Breast cancer Mother    Hypertension Father    Heart disease Father        CHF    Hypertension Sister    Colon polyps Brother    Colon cancer Neg Hx    Esophageal cancer Neg Hx    Rectal cancer Neg Hx    Stomach cancer Neg Hx     Social:  reports that he has never smoked. He has never used smokeless tobacco. He reports current alcohol use. He reports that he does not use drugs.  Allergies:  Allergies  Allergen Reactions   Augmentin [Amoxicillin-Pot Clavulanate] Diarrhea    Medications: I have reviewed the patient's current medications.  Results for orders placed or performed during the hospital encounter of 05/21/22 (from the past 48 hour(s))  Comprehensive metabolic panel     Status: Abnormal   Collection Time:  05/21/22  4:31 PM  Result Value Ref Range   Sodium 140 135 - 145 mmol/L   Potassium 3.9 3.5 - 5.1 mmol/L   Chloride 102 98 - 111 mmol/L   CO2 27 22 - 32 mmol/L   Glucose, Bld 116 (H) 70 - 99 mg/dL    Comment: Glucose reference range applies only to samples taken after fasting for at least 8 hours.   BUN 17 6 - 20 mg/dL   Creatinine, Ser 1.10 0.61 - 1.24 mg/dL   Calcium 10.0 8.9 - 10.3 mg/dL   Total Protein 7.1 6.5 - 8.1 g/dL   Albumin 4.8 3.5 - 5.0 g/dL   AST 24 15 - 41 U/L   ALT 29 0 - 44 U/L   Alkaline Phosphatase 62 38 - 126 U/L   Total Bilirubin 1.8 (H) 0.3 - 1.2 mg/dL   GFR, Estimated >60 >60 mL/min    Comment: (NOTE) Calculated using the CKD-EPI Creatinine Equation (2021)    Anion gap 11 5 - 15    Comment: Performed at [facility], [place], [place] [address]  Lipase, blood     Status: None   Collection Time: 05/21/22  4:31 PM  Result Value Ref Range   Lipase 13 11 - 51 U/L    Comment: Performed at [facility], [address] [place], [place] [address]  CBC with Diff     Status: Abnormal   Collection Time: 05/21/22  4:31 PM  Result Value Ref Range   WBC 10.4 4.0 - 10.5 K/uL   RBC 5.43 4.22 - 5.81 MIL/uL   Hemoglobin 16.1 13.0 - 17.0 g/dL   HCT 46.4 39.0 - 52.0 %   MCV 85.5 80.0 - 100.0 fL   MCH 29.7 26.0 - 34.0 pg   MCHC 34.7 30.0 - 36.0 g/dL   RDW 12.5 11.5 - 15.5 %   Platelets 201 150 - 400 K/uL   nRBC 0.0 0.0 - 0.2 %   Neutrophils Relative % 77 %   Neutro Abs 8.0 (H) 1.7 - 7.7 K/uL   Lymphocytes Relative 15 %   Lymphs Abs 1.5 0.7 - 4.0 K/uL   Monocytes Relative 7 %   Monocytes Absolute 0.7 0.1 - 1.0 K/uL   Eosinophils Relative 1 %   Eosinophils Absolute 0.1 0.0 - 0.5 K/uL   Basophils Relative 0 %   Basophils Absolute 0.0 0.0 - 0.1 K/uL   Immature Granulocytes 0 %   Abs Immature Granulocytes 0.02 0.00 - 0.07 K/uL    Comment: Performed at [facility], [place], [place] [address]  Urinalysis, Routine w reflex microscopic Urine, Unspecified Source     Status: Abnormal   Collection Time: 05/21/22  6:22 PM  Result Value Ref Range   Color, Urine YELLOW YELLOW   APPearance CLEAR CLEAR   Specific Gravity, Urine 1.021 1.005 - 1.030   pH 8.5 (H) 5.0 - 8.0   Glucose, UA NEGATIVE NEGATIVE mg/dL   Hgb urine dipstick NEGATIVE NEGATIVE   Bilirubin Urine NEGATIVE NEGATIVE   Ketones, ur NEGATIVE NEGATIVE mg/dL   Protein, ur 30 (A) NEGATIVE mg/dL   Nitrite NEGATIVE NEGATIVE   Leukocytes,Ua NEGATIVE NEGATIVE   RBC / HPF 0-5 0 - 5 RBC/hpf   WBC, UA 0-5 0 - 5 WBC/hpf   Mucus PRESENT     Comment: Performed at [facility], [place], [place], [place] [address]  CBC     Status: None   Collection Time: 05/22/22  4:48 AM  Result Value Ref Range   WBC 8.6 4.0 - 10.5 K/uL   RBC 4.63 4.22 - 5.81 MIL/uL   Hemoglobin 13.9 13.0 - 17.0 g/dL   HCT 41.1 39.0 - 52.0 %   MCV 88.8 80.0 - 100.0 fL   MCH 30.0 26.0 - 34.0 pg   MCHC 33.8 30.0 - 36.0 g/dL   RDW 12.6 11.5 - 15.5 %   Platelets 152 150 - 400 K/uL   nRBC 0.0 0.0 - 0.2 %    Comment: Performed at [facility], [place] [address] [place], [place] [address]  Basic metabolic panel     Status: Abnormal   Collection Time: 05/22/22  4:48 AM  Result Value Ref Range   Sodium 141 135 - 145 mmol/L   Potassium 4.0 3.5 - 5.1 mmol/L   Chloride 108 98 - 111 mmol/L   CO2 26 22 - 32 mmol/L   Glucose, Bld 104 (H) 70 - 99 mg/dL    Comment: Glucose reference range applies only to samples taken after fasting for at least 8 hours.   BUN 15 6 - 20 mg/dL   Creatinine, Ser 1.03 0.61 - 1.24  mg/dL   Calcium 8.5 (L) 8.9 - 10.3 mg/dL   GFR, Estimated >60 >60 mL/min    Comment: (NOTE) Calculated using the CKD-EPI Creatinine Equation (2021)    Anion gap 7 5 - 15    Comment: Performed at [facility], [place] [address] [place], [place] [address]    CT Abdomen Pelvis W  Contrast  Result Date: 05/21/2022 CLINICAL DATA:  Abdominal pain, acute, nonlocalized. Abdominal pain that started last night; n/v/d , hx of abd problems EXAM: CT ABDOMEN AND PELVIS WITH CONTRAST TECHNIQUE: Multidetector CT imaging of the abdomen and pelvis was performed using the standard protocol following bolus administration of intravenous contrast. RADIATION DOSE REDUCTION: This exam was performed according to the departmental dose-optimization program which includes automated exposure control, adjustment of the mA and/or kV according to patient size and/or use of iterative reconstruction technique. CONTRAST:  166m OMNIPAQUE IOHEXOL 300 MG/ML  SOLN COMPARISON:  CT abdomen pelvis 02/22/2013 FINDINGS: Lower chest: No acute abnormality. Hepatobiliary: No focal liver abnormality. No gallstones, gallbladder wall thickening, or pericholecystic fluid. No biliary dilatation. Pancreas: No focal lesion. Normal pancreatic contour. No surrounding inflammatory changes. No main pancreatic ductal dilatation. Spleen: Normal in size without focal abnormality. Adrenals/Urinary Tract: No adrenal nodule bilaterally. Bilateral kidneys enhance symmetrically. Subcentimeter hypodensity too small to characterize. No hydronephrosis. No hydroureter. The urinary bladder is unremarkable. On delayed imaging, there is no urothelial wall thickening and there are no filling defects in the opacified portions of the bilateral collecting systems or ureters. Stomach/Bowel: Stomach is within normal limits. Several loops of small bowel within the mid anterior abdomen are noted to be distended with fluid as well as fecalized material. Query developing transition point within the right mid abdomen (6:64). Associated slight congestion of the mesenteric vasculature with trace free fluid. No evidence of large bowel wall thickening or dilatation. Appendix appears normal. Vascular/Lymphatic: No abdominal aorta or iliac aneurysm. Mild atherosclerotic  plaque of the aorta and its branches. No abdominal, pelvic, or inguinal lymphadenopathy. Reproductive: Prostate is unremarkable. Other: Trace free fluid within the mid abdomen. Trace free fluid within the pelvis. No intraperitoneal free gas. No organized fluid collection. Musculoskeletal: No abdominal wall hernia or abnormality. No suspicious lytic or blastic osseous lesions. No acute displaced fracture. Multilevel degenerative changes of the spine. IMPRESSION: 1. Findings suggestive of a partial or early small-bowel obstruction. Associated slight congestion of the mesenteric vasculature and trace free fluid. Likely developing transition point within the right mid abdomen. No pneumatosis or bowel perforation. 2.  Aortic Atherosclerosis (ICD10-I70.0). Electronically Signed   By: MIven FinnM.D.   On: 05/21/2022 19:40    ROS - all of the below systems have been reviewed with the patient and positives are indicated with bold text General: chills, fever or night sweats Eyes: blurry vision or double vision ENT: epistaxis or sore throat Hematologic/Lymphatic: bleeding problems, blood clots or swollen lymph nodes Endocrine: temperature intolerance or unexpected weight changes Breast: new or changing breast lumps or nipple discharge Resp: cough, shortness of breath, or wheezing CV: chest pain or dyspnea on exertion GI: as per HPI GU: dysuria, trouble voiding, or hematuria Neuro: TIA or stroke symptoms    PE Blood pressure 122/68, pulse 60, temperature 98 F (36.7 C), temperature source Oral, resp. rate 18, height '6\' 2"'$  (1.88 m), weight 99.8 kg, SpO2 97 %. Constitutional: NAD; conversant; no deformities Eyes: Moist conjunctiva; no lid lag; anicteric; PERRL Neck: Trachea midline; no thyromegaly Lungs: Normal respiratory effort CV: RRR GI: Abd  soft, nondistended MSK: Normal range of motion of extremities; no clubbing/cyanosis Psychiatric: Appropriate affect; alert and oriented x3  Results for  orders placed or performed during the hospital encounter of 05/21/22 (from the past 48 hour(s))  Comprehensive metabolic panel     Status: Abnormal   Collection Time: 05/21/22  4:31 PM  Result Value Ref Range   Sodium 140 135 - 145 mmol/L   Potassium 3.9 3.5 - 5.1 mmol/L   Chloride 102 98 - 111 mmol/L   CO2 27 22 - 32 mmol/L   Glucose, Bld 116 (H) 70 - 99 mg/dL    Comment: Glucose reference range applies only to samples taken after fasting for at least 8 hours.   BUN 17 6 - 20 mg/dL   Creatinine, Ser 1.10 0.61 - 1.24 mg/dL   Calcium 10.0 8.9 - 10.3 mg/dL   Total Protein 7.1 6.5 - 8.1 g/dL   Albumin 4.8 3.5 - 5.0 g/dL   AST 24 15 - 41 U/L   ALT 29 0 - 44 U/L   Alkaline Phosphatase 62 38 - 126 U/L   Total Bilirubin 1.8 (H) 0.3 - 1.2 mg/dL   GFR, Estimated >60 >60 mL/min    Comment: (NOTE) Calculated using the CKD-EPI Creatinine Equation (2021)    Anion gap 11 5 - 15    Comment: Performed at [facility], [address], [place], [place] [address]  Lipase, blood     Status: None   Collection Time: 05/21/22  4:31 PM  Result Value Ref Range   Lipase 13 11 - 51 U/L    Comment: Performed at [facility], [address], [place], [place] [address]  CBC with Diff     Status: Abnormal   Collection Time: 05/21/22  4:31 PM  Result Value Ref Range   WBC 10.4 4.0 - 10.5 K/uL   RBC 5.43 4.22 - 5.81 MIL/uL   Hemoglobin 16.1 13.0 - 17.0 g/dL   HCT 46.4 39.0 - 52.0 %   MCV 85.5 80.0 - 100.0 fL   MCH 29.7 26.0 - 34.0 pg   MCHC 34.7 30.0 - 36.0 g/dL   RDW 12.5 11.5 - 15.5 %   Platelets 201 150 - 400 K/uL   nRBC 0.0 0.0 - 0.2 %   Neutrophils Relative % 77 %   Neutro Abs 8.0 (H) 1.7 - 7.7 K/uL   Lymphocytes Relative 15 %   Lymphs Abs 1.5 0.7 - 4.0 K/uL   Monocytes Relative 7 %   Monocytes Absolute 0.7 0.1 - 1.0 K/uL   Eosinophils Relative 1 %   Eosinophils Absolute 0.1 0.0 - 0.5 K/uL   Basophils Relative 0 %   Basophils Absolute 0.0 0.0 - 0.1 K/uL    Immature Granulocytes 0 %   Abs Immature Granulocytes 0.02 0.00 - 0.07 K/uL    Comment: Performed at [facility], [place], [place] [address]  Urinalysis, Routine w reflex microscopic Urine, Unspecified Source     Status: Abnormal   Collection Time: 05/21/22  6:22 PM  Result Value Ref Range   Color, Urine YELLOW YELLOW   APPearance CLEAR CLEAR   Specific Gravity, Urine 1.021 1.005 - 1.030   pH 8.5 (H) 5.0 - 8.0   Glucose, UA NEGATIVE NEGATIVE mg/dL   Hgb urine dipstick NEGATIVE NEGATIVE   Bilirubin Urine NEGATIVE NEGATIVE   Ketones, ur NEGATIVE NEGATIVE mg/dL   Protein, ur 30 (A) NEGATIVE mg/dL   Nitrite NEGATIVE NEGATIVE   Leukocytes,Ua  NEGATIVE NEGATIVE   RBC / HPF 0-5 0 - 5 RBC/hpf   WBC, UA 0-5 0 - 5 WBC/hpf   Mucus PRESENT     Comment: Performed at [facility], [address], [place], [place] [address]  CBC     Status: None   Collection Time: 05/22/22  4:48 AM  Result Value Ref Range   WBC 8.6 4.0 - 10.5 K/uL   RBC 4.63 4.22 - 5.81 MIL/uL   Hemoglobin 13.9 13.0 - 17.0 g/dL   HCT 41.1 39.0 - 52.0 %   MCV 88.8 80.0 - 100.0 fL   MCH 30.0 26.0 - 34.0 pg   MCHC 33.8 30.0 - 36.0 g/dL   RDW 12.6 11.5 - 15.5 %   Platelets 152 150 - 400 K/uL   nRBC 0.0 0.0 - 0.2 %    Comment: Performed at [facility], [place] [address], [place], [place] [address]  Basic metabolic panel     Status: Abnormal   Collection Time: 05/22/22  4:48 AM  Result Value Ref Range   Sodium 141 135 - 145 mmol/L   Potassium 4.0 3.5 - 5.1 mmol/L   Chloride 108 98 - 111 mmol/L   CO2 26 22 - 32 mmol/L   Glucose, Bld 104 (H) 70 - 99 mg/dL    Comment: Glucose reference range applies only to samples taken after fasting for at least 8 hours.   BUN 15 6 - 20 mg/dL   Creatinine, Ser 1.03 0.61 - 1.24 mg/dL   Calcium 8.5 (L) 8.9 - 10.3 mg/dL   GFR, Estimated >60 >60 mL/min    Comment: (NOTE) Calculated using the CKD-EPI Creatinine Equation  (2021)    Anion gap 7 5 - 15    Comment: Performed at [facility], [place] [address] [place], [place] [address]    CT Abdomen Pelvis W Contrast  Result Date: 05/21/2022 CLINICAL DATA:  Abdominal pain, acute, nonlocalized. Abdominal pain that started last night; n/v/d , hx of abd problems EXAM: CT ABDOMEN AND PELVIS WITH CONTRAST TECHNIQUE: Multidetector CT imaging of the abdomen and pelvis was performed using the standard protocol following bolus administration of intravenous contrast. RADIATION DOSE REDUCTION: This exam was performed according to the departmental dose-optimization program which includes automated exposure control, adjustment of the mA and/or kV according to patient size and/or use of iterative reconstruction technique. CONTRAST:  180m OMNIPAQUE IOHEXOL 300 MG/ML  SOLN COMPARISON:  CT abdomen pelvis 02/22/2013 FINDINGS: Lower chest: No acute abnormality. Hepatobiliary: No focal liver abnormality. No gallstones, gallbladder wall thickening, or pericholecystic fluid. No biliary dilatation. Pancreas: No focal lesion. Normal pancreatic contour. No surrounding inflammatory changes. No main pancreatic ductal dilatation. Spleen: Normal in size without focal abnormality. Adrenals/Urinary Tract: No adrenal nodule bilaterally. Bilateral kidneys enhance symmetrically. Subcentimeter hypodensity too small to characterize. No hydronephrosis. No hydroureter. The urinary bladder is unremarkable. On delayed imaging, there is no urothelial wall thickening and there are no filling defects in the opacified portions of the bilateral collecting systems or ureters. Stomach/Bowel: Stomach is within normal limits. Several loops of small bowel within the mid anterior abdomen are noted to be distended with fluid as well as fecalized material. Query developing transition point within the right mid abdomen (6:64). Associated slight congestion of the mesenteric vasculature with trace free fluid. No  evidence of large bowel wall thickening or dilatation. Appendix appears normal. Vascular/Lymphatic: No abdominal aorta or iliac aneurysm. Mild atherosclerotic plaque of the aorta and its branches. No abdominal,  pelvic, or inguinal lymphadenopathy. Reproductive: Prostate is unremarkable. Other: Trace free fluid within the mid abdomen. Trace free fluid within the pelvis. No intraperitoneal free gas. No organized fluid collection. Musculoskeletal: No abdominal wall hernia or abnormality. No suspicious lytic or blastic osseous lesions. No acute displaced fracture. Multilevel degenerative changes of the spine. IMPRESSION: 1. Findings suggestive of a partial or early small-bowel obstruction. Associated slight congestion of the mesenteric vasculature and trace free fluid. Likely developing transition point within the right mid abdomen. No pneumatosis or bowel perforation. 2.  Aortic Atherosclerosis (ICD10-I70.0). Electronically Signed   By: Iven Finn M.D.   On: 05/21/2022 19:40     A/P: Francisco Pena is an 56 y.o. male with acute onset of abdominal pain and diarrhea.  I have reviewed the CT scan images.  There appears to be some dilated small bowel within the central abdomen.  I can see no transition point.  There is minimal inflammation and stranding.  No hernia was identified.  NG tube was attempted last night but unable to be placed due to inability to pass the nasal passages.  Given the patient's symptomatology, would favor enteritis as the most likely etiology but could be developing a bowel obstruction.  Would keep n.p.o. today with ice chips and sips of water only.  We will repeat abdominal x-ray and exam in the morning to see if his symptoms are resolving.  If his symptoms do not resolve, patient may need diagnostic laparoscopy.    Rosario Adie, MD  Colorectal and General Surgery [facility]  Total time of evaluation, examination, counseling and implementing medical decisions was  40 mins.  straightforward decision making.

## 2022-05-23 ENCOUNTER — Inpatient Hospital Stay (HOSPITAL_COMMUNITY): Payer: BC Managed Care – PPO

## 2022-05-23 ENCOUNTER — Other Ambulatory Visit: Payer: Self-pay | Admitting: Registered Nurse

## 2022-05-23 DIAGNOSIS — K56609 Unspecified intestinal obstruction, unspecified as to partial versus complete obstruction: Secondary | ICD-10-CM | POA: Diagnosis not present

## 2022-05-23 DIAGNOSIS — G479 Sleep disorder, unspecified: Secondary | ICD-10-CM

## 2022-05-23 LAB — CBC WITH DIFFERENTIAL/PLATELET
Abs Immature Granulocytes: 0.02 10*3/uL (ref 0.00–0.07)
Basophils Absolute: 0 10*3/uL (ref 0.0–0.1)
Basophils Relative: 1 %
Eosinophils Absolute: 0.2 10*3/uL (ref 0.0–0.5)
Eosinophils Relative: 4 %
HCT: 39.8 % (ref 39.0–52.0)
Hemoglobin: 13.4 g/dL (ref 13.0–17.0)
Immature Granulocytes: 0 %
Lymphocytes Relative: 38 %
Lymphs Abs: 2.2 10*3/uL (ref 0.7–4.0)
MCH: 29.8 pg (ref 26.0–34.0)
MCHC: 33.7 g/dL (ref 30.0–36.0)
MCV: 88.6 fL (ref 80.0–100.0)
Monocytes Absolute: 0.5 10*3/uL (ref 0.1–1.0)
Monocytes Relative: 9 %
Neutro Abs: 2.7 10*3/uL (ref 1.7–7.7)
Neutrophils Relative %: 48 %
Platelets: 150 10*3/uL (ref 150–400)
RBC: 4.49 MIL/uL (ref 4.22–5.81)
RDW: 12.3 % (ref 11.5–15.5)
WBC: 5.8 10*3/uL (ref 4.0–10.5)
nRBC: 0 % (ref 0.0–0.2)

## 2022-05-23 LAB — BASIC METABOLIC PANEL
Anion gap: 6 (ref 5–15)
BUN: 16 mg/dL (ref 6–20)
CO2: 27 mmol/L (ref 22–32)
Calcium: 8.6 mg/dL — ABNORMAL LOW (ref 8.9–10.3)
Chloride: 108 mmol/L (ref 98–111)
Creatinine, Ser: 1.07 mg/dL (ref 0.61–1.24)
GFR, Estimated: >60 mL/min (ref >60)
Glucose, Bld: 84 mg/dL (ref 70–99)
Potassium: 3.8 mmol/L (ref 3.5–5.1)
Sodium: 141 mmol/L (ref 135–145)

## 2022-05-23 LAB — C DIFFICILE QUICK SCREEN W PCR REFLEX
C Diff antigen: NEGATIVE
C Diff interpretation: NOT DETECTED
C Diff toxin: NEGATIVE

## 2022-05-23 LAB — HEMOGLOBIN AND HEMATOCRIT, BLOOD
HCT: 41.1 % (ref 39.0–52.0)
Hemoglobin: 13.8 g/dL (ref 13.0–17.0)

## 2022-05-23 LAB — MAGNESIUM: Magnesium: 2 mg/dL (ref 1.7–2.4)

## 2022-05-23 LAB — OCCULT BLOOD X 1 CARD TO LAB, STOOL: Fecal Occult Bld: NEGATIVE

## 2022-05-23 MED ORDER — DIATRIZOATE MEGLUMINE & SODIUM 66-10 % PO SOLN
90.0000 mL | Freq: Once | ORAL | Status: AC
  Administered 2022-05-23: 90 mL via ORAL
  Filled 2022-05-23: qty 90

## 2022-05-23 NOTE — Progress Notes (Signed)
PT. Chose not to use hospitals CPAP tonight because he has own CPAP equipment. Wife did not bring pt CPAP. He said he will start tomorrow night.

## 2022-05-23 NOTE — Progress Notes (Signed)
  Transition of Care [facility]) Screening Note   Patient Details  Name: Francisco Pena Date of Birth: 1965/11/03   Transition of Care [facility]) CM/SW Contact:    Lennart Pall, LCSW Phone Number: 05/23/2022, 12:14 PM    Transition of Care Department [facility]) has reviewed patient and no TOC needs have been identified at this time. We will continue to monitor patient advancement through interdisciplinary progression rounds. If new patient transition needs arise, please place a TOC consult.

## 2022-05-23 NOTE — Hospital Course (Addendum)
56 year old male with history of HLD, GERD, OSA brother with colon polyps in his 66s, previous colonoscopy normal 2 to 3 years ago presented to [facility] with abd pain and nausea 05/20/22. In ED CT showed early or partial SBO. He is a 56 yr old with anxiety, GERD, and OSA who presents with abdominal pain and N/V/D since night of 7/21 and has CT findings concerning for partial or early SBO, general surgery consulted and patient was admitted to hospital for further management.

## 2022-05-23 NOTE — Progress Notes (Signed)
Subjective: CC: Notes diarrhea Friday and 1 episode of Saturday. No bm since. Still passing flatus. Some fullness of his epigastrium but no real pain. Nauseated and requiring Zofran. No emesis. Never had a endoscopy before. Last colonoscopy 2020 that showed polyps in the colon with two polyps were found in the descending colon and ascending colon - path sessile serrated polyp without cytologic dysplasia. No prior surgeries.   Objective: Vital signs in last 24 hours: Temp:  [97.6 F (36.4 C)-98.4 F (36.9 C)] 98.4 F (36.9 C) (07/24 0547) Pulse Rate:  [58-63] 63 (07/24 0547) Resp:  [18] 18 (07/24 0547) BP: (122-136)/(58-76) 136/76 (07/24 0547) SpO2:  [95 %-98 %] 95 % (07/24 0547) Last BM Date : 05/21/22  Intake/Output from previous day: 07/23 0701 - 07/24 0700 In: 3341.2 [P.O.:360; I.V.:2981.2] Out: 3280 [Urine:3280] Intake/Output this shift: No intake/output data recorded.  PE: Gen:  Alert, NAD, pleasant Abd: Soft, mild distension, very mild ttp of the RLQ and epigastrium without rigidity or guarding. +BS Psych: A&Ox3    Lab Results:  Recent Labs    05/22/22 0448 05/23/22 0434  WBC 8.6 5.8  HGB 13.9 13.4  HCT 41.1 39.8  PLT 152 150   BMET Recent Labs    05/22/22 0448 05/23/22 0434  NA 141 141  K 4.0 3.8  CL 108 108  CO2 26 27  GLUCOSE 104* 84  BUN 15 16  CREATININE 1.03 1.07  CALCIUM 8.5* 8.6*   PT/INR No results for input(s): "LABPROT", "INR" in the last 72 hours. CMP     Component Value Date/Time   NA 141 05/23/2022 0434   K 3.8 05/23/2022 0434   CL 108 05/23/2022 0434   CO2 27 05/23/2022 0434   GLUCOSE 84 05/23/2022 0434   BUN 16 05/23/2022 0434   CREATININE 1.07 05/23/2022 0434   CREATININE 1.30 08/17/2018 1632   CALCIUM 8.6 (L) 05/23/2022 0434   PROT 7.1 05/21/2022 1631   ALBUMIN 4.8 05/21/2022 1631   AST 24 05/21/2022 1631   ALT 29 05/21/2022 1631   ALKPHOS 62 05/21/2022 1631   BILITOT 1.8 (H) 05/21/2022 1631   GFRNONAA >60  05/23/2022 0434   GFRAA 82 (L) 02/22/2013 0001   T. Bili appears chronically elevated dating back to at least 2019 No gallstones noted on CT Lipase     Component Value Date/Time   LIPASE 13 05/21/2022 1631    Studies/Results: CT Abdomen Pelvis W Contrast  Result Date: 05/21/2022 CLINICAL DATA:  Abdominal pain, acute, nonlocalized. Abdominal pain that started last night; n/v/d , hx of abd problems EXAM: CT ABDOMEN AND PELVIS WITH CONTRAST TECHNIQUE: Multidetector CT imaging of the abdomen and pelvis was performed using the standard protocol following bolus administration of intravenous contrast. RADIATION DOSE REDUCTION: This exam was performed according to the departmental dose-optimization program which includes automated exposure control, adjustment of the mA and/or kV according to patient size and/or use of iterative reconstruction technique. CONTRAST:  124m OMNIPAQUE IOHEXOL 300 MG/ML  SOLN COMPARISON:  CT abdomen pelvis 02/22/2013 FINDINGS: Lower chest: No acute abnormality. Hepatobiliary: No focal liver abnormality. No gallstones, gallbladder wall thickening, or pericholecystic fluid. No biliary dilatation. Pancreas: No focal lesion. Normal pancreatic contour. No surrounding inflammatory changes. No main pancreatic ductal dilatation. Spleen: Normal in size without focal abnormality. Adrenals/Urinary Tract: No adrenal nodule bilaterally. Bilateral kidneys enhance symmetrically. Subcentimeter hypodensity too small to characterize. No hydronephrosis. No hydroureter. The urinary bladder is unremarkable. On delayed imaging, there is no urothelial  wall thickening and there are no filling defects in the opacified portions of the bilateral collecting systems or ureters. Stomach/Bowel: Stomach is within normal limits. Several loops of small bowel within the mid anterior abdomen are noted to be distended with fluid as well as fecalized material. Query developing transition point within the right mid  abdomen (6:64). Associated slight congestion of the mesenteric vasculature with trace free fluid. No evidence of large bowel wall thickening or dilatation. Appendix appears normal. Vascular/Lymphatic: No abdominal aorta or iliac aneurysm. Mild atherosclerotic plaque of the aorta and its branches. No abdominal, pelvic, or inguinal lymphadenopathy. Reproductive: Prostate is unremarkable. Other: Trace free fluid within the mid abdomen. Trace free fluid within the pelvis. No intraperitoneal free gas. No organized fluid collection. Musculoskeletal: No abdominal wall hernia or abnormality. No suspicious lytic or blastic osseous lesions. No acute displaced fracture. Multilevel degenerative changes of the spine. IMPRESSION: 1. Findings suggestive of a partial or early small-bowel obstruction. Associated slight congestion of the mesenteric vasculature and trace free fluid. Likely developing transition point within the right mid abdomen. No pneumatosis or bowel perforation. 2.  Aortic Atherosclerosis (ICD10-I70.0). Electronically Signed   By: Iven Finn M.D.   On: 05/21/2022 19:40    Anti-infectives: Anti-infectives (From admission, onward)    None        Assessment/Plan SBO vs Enteritis  - CT 7/22 suggestive of a psbo vs early sbo w/ developing transition in the right mid abdomen and associated slight congestion of the mesenteric vasculature and trace free fluid.  - Given hx, favor enteritis. Patient does not have typical risk factors for sbo (prior surgeries etc) although would like to follow closely to ensure he improves - Xray this am with final read pending. Appears to have air in colon and rectum and small bowel loops do not appear dilated although could be fluid filled. Await final read from radiology.  - Will do trial of CLD and also do SBO protocol orally - We will follow with you  FEN - CLD, IVF per [facility] VTE - SCDs, Lovenox ID - None  HTN   LOS: 2 days    Jillyn Ledger ,  [facility] Surgery 05/23/2022, 7:53 AM Please see Amion for pager number during day hours 7:00am-4:30pm

## 2022-05-23 NOTE — Progress Notes (Signed)
PROGRESS NOTE Francisco Pena  IRW:[id number] DOB: Mar 24, 1966 DOA: 05/21/2022 PCP: [name], NP   Brief Narrative/Hospital Course: 56 year old male with history of HLD, GERD, OSA brother with colon polyps in his 58s, previous colonoscopy normal 2 to 3 years ago presented to [facility] with abd pain and nausea 05/20/22. In ED CT showed early or partial SBO. He is a 56 yr old with anxiety, GERD, and OSA who presents with abdominal pain and N/V/D since night of 7/21 and has CT findings concerning for partial or early SBO, general surgery consulted and patient was admitted to hospital for further management.    Subjective: Passing flatus and some BM loose initially and later soft. Wife at bedside No nausea or vomiting   Assessment and Plan: Principal Problem:   SBO (small bowel obstruction) (HCC) Active Problems:   Essential (primary) hypertension   Possible SBO  Abdominal pain and diarrhea: CT shows dilated small bowel in the central abdomen, no transition point.  Seen by surgery felt to be enteritis, but could be developing a bowel obstruction.  On conservative management n.p.o. sips of water-feels better today passing flatus had bowel movement, x-ray shows significant improvement.  Continue current plan  CLD diet and ADAT as per surgery  Essential hypertension: Stable, continue Toprol HLD on Lipitor on hold Mood disorder on trazodone , Neurontin and Zoloft continue OSA-order CPAP:He is wondering if he can have a new CPAP which is in process from PCCM office  DVT prophylaxis: enoxaparin (LOVENOX) injection 40 mg Start: 05/22/22 1000 Code Status:   Code Status: Full Code Family Communication: plan of care discussed with patient at bedside. Patient status is: inpatient because of management of possible SBO Level of care: Med-Surg   Dispo: The patient is from: home            Anticipated disposition: home in 1-2 days  Mobility Assessment (last 72 hours)     Mobility Assessment     Row  Name 05/22/22 2004 05/22/22 0800 05/22/22 0758 05/21/22 2205     Does patient have an order for bedrest or is patient medically unstable No - Continue assessment No - Continue assessment No - Continue assessment No - Continue assessment    What is the highest level of mobility based on the progressive mobility assessment? Level 6 (Walks independently in room and hall) - Balance while walking in room without assist - Complete Level 6 (Walks independently in room and hall) - Balance while walking in room without assist - Complete Level 6 (Walks independently in room and hall) - Balance while walking in room without assist - Complete Level 6 (Walks independently in room and hall) - Balance while walking in room without assist - Complete              Objective: Vitals last 24 hrs: Vitals:   05/22/22 0952 05/22/22 1405 05/22/22 2115 05/23/22 0547  BP: (!) 133/58 134/61 122/69 136/76  Pulse: 63 61 (!) 58 63  Resp: '18 18 18 18  '$ Temp: 98.4 F (36.9 C) 98.2 F (36.8 C) 97.6 F (36.4 C) 98.4 F (36.9 C)  TempSrc: Oral Oral Oral Oral  SpO2: 96% 97% 98% 95%  Weight:      Height:       Weight change:   Physical Examination: General exam: alert awake,older than stated age, weak appearing. HEENT:Oral mucosa moist, Ear/Nose WNL grossly, dentition normal. Respiratory system: bilaterally clear BS, no use of accessory muscle Cardiovascular system: S1 & S2 +, No JVD.  Gastrointestinal system: Abdomen soft,NT, sluggish bowel sounds  Nervous System:Alert, awake, moving extremities and grossly nonfocal Extremities: LE edema neg,distal peripheral pulses palpable.  Skin: No rashes,no icterus. MSK: Normal muscle bulk,tone, power  Medications reviewed:  Scheduled Meds:  atorvastatin  20 mg Oral Daily   Benzocaine   Mouth/Throat Once   enoxaparin (LOVENOX) injection  40 mg Subcutaneous Q24H   gabapentin  600 mg Oral QHS   metoprolol succinate  12.5 mg Oral QHS   multivitamin with minerals  1  tablet Oral Daily   ondansetron (ZOFRAN) IV  4 mg Intravenous Once   sertraline  150 mg Oral Daily   Continuous Infusions:  lactated ringers 125 mL/hr at 05/22/22 2343      Diet Order             Diet clear liquid Room service appropriate? Yes; Fluid consistency: Thin  Diet effective now                            Intake/Output Summary (Last 24 hours) at 05/23/2022 1106 Last data filed at 05/23/2022 1000 Gross per 24 hour  Intake 3451.5 ml  Output 3430 ml  Net 21.5 ml   Net IO Since Admission: 1,079.52 mL [05/23/22 1106]  Wt Readings from Last 3 Encounters:  05/21/22 99.8 kg  04/13/22 101.7 kg  03/10/22 101.2 kg     Unresulted Labs (From admission, onward)    None     Data Reviewed: I have personally reviewed following labs and imaging studies CBC: Recent Labs  Lab 05/21/22 1631 05/22/22 0448 05/23/22 0434  WBC 10.4 8.6 5.8  NEUTROABS 8.0*  --  2.7  HGB 16.1 13.9 13.4  HCT 46.4 41.1 39.8  MCV 85.5 88.8 88.6  PLT 201 152 235   Basic Metabolic Panel: Recent Labs  Lab 05/21/22 1631 05/22/22 0448 05/23/22 0434  NA 140 141 141  K 3.9 4.0 3.8  CL 102 108 108  CO2 '27 26 27  '$ GLUCOSE 116* 104* 84  BUN '17 15 16  '$ CREATININE 1.10 1.03 1.07  CALCIUM 10.0 8.5* 8.6*  MG  --   --  2.0   GFR: Estimated Creatinine Clearance: 100.7 mL/min (by C-G formula based on SCr of 1.07 mg/dL). Liver Function Tests: Recent Labs  Lab 05/21/22 1631  AST 24  ALT 29  ALKPHOS 62  BILITOT 1.8*  PROT 7.1  ALBUMIN 4.8   Recent Labs  Lab 05/21/22 1631  LIPASE 13   No results for input(s): "AMMONIA" in the last 168 hours. Coagulation Profile: No results for input(s): "INR", "PROTIME" in the last 168 hours. BNP (last 3 results) No results for input(s): "PROBNP" in the last 8760 hours. HbA1C: No results for input(s): "HGBA1C" in the last 72 hours. CBG: No results for input(s): "GLUCAP" in the last 168 hours. Lipid Profile: No results for input(s): "CHOL",  "HDL", "LDLCALC", "TRIG", "CHOLHDL", "LDLDIRECT" in the last 72 hours. Thyroid Function Tests: No results for input(s): "TSH", "T4TOTAL", "FREET4", "T3FREE", "THYROIDAB" in the last 72 hours. Sepsis Labs: No results for input(s): "PROCALCITON", "LATICACIDVEN" in the last 168 hours.  No results found for this or any previous visit (from the past 240 hour(s)).  Antimicrobials: Anti-infectives (From admission, onward)    None      Culture/Microbiology No results found for: "SDES", "SPECREQUEST", "CULT", "REPTSTATUS"  Other culture-see note  Radiology Studies: DG Abd Portable 1V  Result Date: 05/23/2022 CLINICAL DATA:  A 56 year old male presents  for evaluation of small bowel obstruction. EXAM: PORTABLE ABDOMEN - 1 VIEW COMPARISON:  Abdomen and pelvis CT from May 21, 2022 FINDINGS: Stool and gas scattered throughout the colon to the level of the rectum. Potential distension of single small bowel loop in the LEFT lower quadrant up to 3.7 cm. Upper abdomen excluded from view on this KUB. No abnormal calcifications over the abdomen. On limited assessment no acute skeletal findings. IMPRESSION: Single gas-filled loop of small bowel in the LEFT lower quadrant with distension. Stool and gas throughout the colon. Findings raise the question of underlying segmental small-bowel dilation but with improvement when compared to previous imaging. Electronically Signed   By: Zetta Bills M.D.   On: 05/23/2022 08:31   CT Abdomen Pelvis W Contrast  Result Date: 05/21/2022 CLINICAL DATA:  Abdominal pain, acute, nonlocalized. Abdominal pain that started last night; n/v/d , hx of abd problems EXAM: CT ABDOMEN AND PELVIS WITH CONTRAST TECHNIQUE: Multidetector CT imaging of the abdomen and pelvis was performed using the standard protocol following bolus administration of intravenous contrast. RADIATION DOSE REDUCTION: This exam was performed according to the departmental dose-optimization program which includes  automated exposure control, adjustment of the mA and/or kV according to patient size and/or use of iterative reconstruction technique. CONTRAST:  146m OMNIPAQUE IOHEXOL 300 MG/ML  SOLN COMPARISON:  CT abdomen pelvis 02/22/2013 FINDINGS: Lower chest: No acute abnormality. Hepatobiliary: No focal liver abnormality. No gallstones, gallbladder wall thickening, or pericholecystic fluid. No biliary dilatation. Pancreas: No focal lesion. Normal pancreatic contour. No surrounding inflammatory changes. No main pancreatic ductal dilatation. Spleen: Normal in size without focal abnormality. Adrenals/Urinary Tract: No adrenal nodule bilaterally. Bilateral kidneys enhance symmetrically. Subcentimeter hypodensity too small to characterize. No hydronephrosis. No hydroureter. The urinary bladder is unremarkable. On delayed imaging, there is no urothelial wall thickening and there are no filling defects in the opacified portions of the bilateral collecting systems or ureters. Stomach/Bowel: Stomach is within normal limits. Several loops of small bowel within the mid anterior abdomen are noted to be distended with fluid as well as fecalized material. Query developing transition point within the right mid abdomen (6:64). Associated slight congestion of the mesenteric vasculature with trace free fluid. No evidence of large bowel wall thickening or dilatation. Appendix appears normal. Vascular/Lymphatic: No abdominal aorta or iliac aneurysm. Mild atherosclerotic plaque of the aorta and its branches. No abdominal, pelvic, or inguinal lymphadenopathy. Reproductive: Prostate is unremarkable. Other: Trace free fluid within the mid abdomen. Trace free fluid within the pelvis. No intraperitoneal free gas. No organized fluid collection. Musculoskeletal: No abdominal wall hernia or abnormality. No suspicious lytic or blastic osseous lesions. No acute displaced fracture. Multilevel degenerative changes of the spine. IMPRESSION: 1. Findings  suggestive of a partial or early small-bowel obstruction. Associated slight congestion of the mesenteric vasculature and trace free fluid. Likely developing transition point within the right mid abdomen. No pneumatosis or bowel perforation. 2.  Aortic Atherosclerosis (ICD10-I70.0). Electronically Signed   By: MIven FinnM.D.   On: 05/21/2022 19:40     LOS: 2 days   RAntonieta Pert MD [facility]  05/23/2022, 11:06 AM

## 2022-05-24 ENCOUNTER — Other Ambulatory Visit (HOSPITAL_COMMUNITY): Payer: Self-pay

## 2022-05-24 DIAGNOSIS — K56609 Unspecified intestinal obstruction, unspecified as to partial versus complete obstruction: Secondary | ICD-10-CM | POA: Diagnosis not present

## 2022-05-24 LAB — CBC
HCT: 39.8 % (ref 39.0–52.0)
Hemoglobin: 13.5 g/dL (ref 13.0–17.0)
MCH: 29.9 pg (ref 26.0–34.0)
MCHC: 33.9 g/dL (ref 30.0–36.0)
MCV: 88.2 fL (ref 80.0–100.0)
Platelets: 152 10*3/uL (ref 150–400)
RBC: 4.51 MIL/uL (ref 4.22–5.81)
RDW: 12.2 % (ref 11.5–15.5)
WBC: 6.2 10*3/uL (ref 4.0–10.5)
nRBC: 0 % (ref 0.0–0.2)

## 2022-05-24 MED ORDER — PANTOPRAZOLE SODIUM 40 MG PO TBEC
40.0000 mg | DELAYED_RELEASE_TABLET | Freq: Every day | ORAL | 0 refills | Status: AC
  Filled 2022-05-24: qty 30, 30d supply, fill #0

## 2022-05-24 MED ORDER — ALUM & MAG HYDROXIDE-SIMETH 200-200-20 MG/5ML PO SUSP
30.0000 mL | Freq: Four times a day (QID) | ORAL | Status: DC | PRN
  Administered 2022-05-24: 30 mL via ORAL
  Filled 2022-05-24: qty 30

## 2022-05-24 MED ORDER — PANTOPRAZOLE SODIUM 40 MG PO TBEC
40.0000 mg | DELAYED_RELEASE_TABLET | Freq: Every day | ORAL | Status: DC
  Administered 2022-05-24 – 2022-05-25 (×2): 40 mg via ORAL
  Filled 2022-05-24 (×2): qty 1

## 2022-05-24 NOTE — Progress Notes (Signed)
Patient's IV fluids were stopped, he is refusing fluids.

## 2022-05-24 NOTE — Progress Notes (Signed)
PT declined CPAP.

## 2022-05-24 NOTE — Progress Notes (Signed)
Subjective: CC: Doing well. Some upper abdominal fullness but otherwise abdominal pain and nausea have resolved. Tolerating cld without emesis. Passing flatus. Multiple episodes of loose/liquid stool yesterday (primary checked C. Diff which was negative). Xray this am with contrast in the colon and rectum.   Objective: Vital signs in last 24 hours: Temp:  [97.9 F (36.6 C)-98.1 F (36.7 C)] 98 F (36.7 C) (07/25 0906) Pulse Rate:  [55-58] 56 (07/25 0906) Resp:  [17-18] 17 (07/25 0906) BP: (124-136)/(72-79) 136/79 (07/25 0906) SpO2:  [97 %-98 %] 98 % (07/25 0906) Last BM Date : 05/24/22  Intake/Output from previous day: 07/24 0701 - 07/25 0700 In: 5161.6 [P.O.:2240; I.V.:2921.6] Out: 4375 [Urine:4375] Intake/Output this shift: No intake/output data recorded.  PE: Gen:  Alert, NAD, pleasant Abd: Soft, ND, NT. +BS Psych: A&Ox3   Lab Results:  Recent Labs    05/23/22 0434 05/23/22 1914 05/24/22 0408  WBC 5.8  --  6.2  HGB 13.4 13.8 13.5  HCT 39.8 41.1 39.8  PLT 150  --  152   BMET Recent Labs    05/22/22 0448 05/23/22 0434  NA 141 141  K 4.0 3.8  CL 108 108  CO2 26 27  GLUCOSE 104* 84  BUN 15 16  CREATININE 1.03 1.07  CALCIUM 8.5* 8.6*   PT/INR No results for input(s): "LABPROT", "INR" in the last 72 hours. CMP     Component Value Date/Time   NA 141 05/23/2022 0434   K 3.8 05/23/2022 0434   CL 108 05/23/2022 0434   CO2 27 05/23/2022 0434   GLUCOSE 84 05/23/2022 0434   BUN 16 05/23/2022 0434   CREATININE 1.07 05/23/2022 0434   CREATININE 1.30 08/17/2018 1632   CALCIUM 8.6 (L) 05/23/2022 0434   PROT 7.1 05/21/2022 1631   ALBUMIN 4.8 05/21/2022 1631   AST 24 05/21/2022 1631   ALT 29 05/21/2022 1631   ALKPHOS 62 05/21/2022 1631   BILITOT 1.8 (H) 05/21/2022 1631   GFRNONAA >60 05/23/2022 0434   GFRAA 82 (L) 02/22/2013 0001   Lipase     Component Value Date/Time   LIPASE 13 05/21/2022 1631    Studies/Results: DG Abd Portable 1V-Small  Bowel Obstruction Protocol-initial, 8 hr delay  Result Date: 05/23/2022 CLINICAL DATA:  Small bowel protocol 8 hour delighted EXAM: PORTABLE ABDOMEN - 1 VIEW COMPARISON:  05/23/2022, CT 05/21/2022 FINDINGS: Air filled nondistended central small bowel with abundant colon gas. Dilute contrast present in the colon and rectum. IMPRESSION: Contrast is present in the colon and rectum. There is decreased small bowel distension Electronically Signed   By: Donavan Foil M.D.   On: 05/23/2022 17:38   DG Abd Portable 1V  Result Date: 05/23/2022 CLINICAL DATA:  A 56 year old male presents for evaluation of small bowel obstruction. EXAM: PORTABLE ABDOMEN - 1 VIEW COMPARISON:  Abdomen and pelvis CT from May 21, 2022 FINDINGS: Stool and gas scattered throughout the colon to the level of the rectum. Potential distension of single small bowel loop in the LEFT lower quadrant up to 3.7 cm. Upper abdomen excluded from view on this KUB. No abnormal calcifications over the abdomen. On limited assessment no acute skeletal findings. IMPRESSION: Single gas-filled loop of small bowel in the LEFT lower quadrant with distension. Stool and gas throughout the colon. Findings raise the question of underlying segmental small-bowel dilation but with improvement when compared to previous imaging. Electronically Signed   By: Zetta Bills M.D.   On: 05/23/2022 08:31  Anti-infectives: Anti-infectives (From admission, onward)    None        Assessment/Plan SBO vs Enteritis  - Appears to be clinically and radiographically resolving. Xray with contrast in colon and rectum. Abdominal pain resolved. No abdominal ttp. Tolerating CLD without n/v. Having bowel function. His diet can be advanced as tolerated. If he tolerates, he can be d/c'd from our standpoint later today. Discussed with [facility].    FEN - ADAT, IVF per [facility] VTE - SCDs, Lovenox ID - None   HTN   LOS: 3 days    Jillyn Ledger , [facility]  Surgery 05/24/2022, 9:08 AM Please see Amion for pager number during day hours 7:00am-4:30pm

## 2022-05-24 NOTE — Progress Notes (Signed)
NP Olena Heckle was contacted because patient was supposed to be discharged earlier today but didn't feel comfortable at the time but he just had a BM and wants to know if he could be discharged now.

## 2022-05-24 NOTE — Discharge Summary (Signed)
Physician Discharge Summary  Francisco Pena:[id number] DOB: [birth year] DOA: 05/21/2022  PCP: [name], NP  Admit date: 05/21/2022 Discharge date: 05/25/2022 Recommendations for Outpatient Follow-up:  Follow up with PCP in 1 weeks-call for appointment Follow-up with surgery as outpatient prn  Discharge Dispo: home Discharge Condition: Stable Code Status:   Code Status: Full Code Diet recommendation:  Diet Order             DIET SOFT Room service appropriate? Yes; Fluid consistency: Thin  Diet effective now                    Brief/Interim Summary: 56 year old male with history of HLD, GERD, OSA brother with colon polyps in his 1s, previous colonoscopy normal 2 to 3 years ago presented to [facility] with abd pain and nausea 05/20/22. In ED CT showed early or partial SBO. He is a 56 yr old with anxiety, GERD, and OSA who presents with abdominal pain and N/V/D since night of 7/21 and has CT findings concerning for partial or early SBO, general surgery consulted and patient was admitted to hospital for further management.  Small bowel follow-through showed contrast in the rectum and colon, patient having bowel movement, diet advanced per surgery.  He had loose stool/diarrhea blackish discolored but negative for Hemoccult and C. difficile.  Diet is advanced once tolerating he will be discharged home.Patient stayed overnight due to loose stool and to ensure that he is tolerating diet.  He has had solid BM, discussed with surgery and okay for discharge Patient has been tolerating soft diet although some loose stool.  Surgery PA Claiborne Billings paged, since tolerating diet okay to discharge home outpatient follow-up with PCP  Discharge Diagnoses:  Principal Problem:   SBO (small bowel obstruction) ([place]) Active Problems:   Essential (primary) hypertension   Possible SBO in the setting of enteritis: Appreciate surgery input.  At this time resume advanced diet tolerated soft diet and per surgery  recommendation he will be discharged home.C. difficile negative Hemoccult negative hemoglobin is stable  Essential hypertension: Stable, continue Toprol HLD on Lipitor to be resumed at home  Mood disorder on trazodone , Neurontin and Zoloft continue GERD: We will add PPI on discharge, will follow up with Dr. Benson Norway from GI  Consults: General surgery Subjective: Alert awake oriented resting comfortably mild abdomen discomfort  Discharge Exam: Vitals:   05/24/22 1342 05/24/22 2045  BP: 138/78 135/80  Pulse: 63 61  Resp: 18 16  Temp:  98 F (36.7 C)  SpO2: 99% 97%   General: Pt is alert, awake, not in acute distress Cardiovascular: RRR, S1/S2 +, no rubs, no gallops Respiratory: CTA bilaterally, no wheezing, no rhonchi Abdominal: Soft, NT, ND, bowel sounds + Extremities: no edema, no cyanosis  Discharge Instructions  Discharge Instructions     Discharge instructions   Complete by: As directed    Please call call MD or return to ER for similar or worsening recurring problem that brought you to hospital or if any fever,nausea/vomiting,abdominal pain, uncontrolled pain, chest pain,  shortness of breath or any other alarming symptoms.  Please follow-up your doctor as instructed in a week time and call the office for appointment.  Please avoid alcohol, smoking, or any other illicit substance and maintain healthy habits including taking your regular medications as prescribed.  You were cared for by a hospitalist during your hospital stay. If you have any questions about your discharge medications or the care you received while you were in the  hospital after you are discharged, you can call the unit and ask to speak with the hospitalist on call if the hospitalist that took care of you is not available.  Once you are discharged, your primary care physician will handle any further medical issues. Please note that NO REFILLS for any discharge medications will be authorized once you are  discharged, as it is imperative that you return to your primary care physician (or establish a relationship with a primary care physician if you do not have one) for your aftercare needs so that they can reassess your need for medications and monitor your lab values   Increase activity slowly   Complete by: As directed       Allergies as of 05/25/2022       Reactions   Augmentin [amoxicillin-pot Clavulanate] Diarrhea        Medication List     TAKE these medications    atorvastatin 20 MG tablet Commonly known as: LIPITOR Take 1 tablet (20 mg total) by mouth daily.   gabapentin 300 MG capsule Commonly known as: NEURONTIN Take 2 capsules (600 mg total) by mouth at bedtime.   metoprolol succinate 25 MG 24 hr tablet Commonly known as: TOPROL-XL Take 0.5 tablets (12.5 mg total) by mouth daily. What changed: when to take this   multivitamin tablet Take 1 tablet by mouth daily.   pantoprazole 40 MG tablet Commonly known as: PROTONIX Take 1 tablet (40 mg total) by mouth daily.   sertraline 100 MG tablet Commonly known as: ZOLOFT TAKE 1.5 TABLETS ('150MG'$  TOTAL) BY MOUTH DAILY What changed:  how much to take how to take this when to take this additional instructions   traZODone 50 MG tablet Commonly known as: DESYREL TAKE 0.5-1 TABLETS BY MOUTH AT BEDTIME AS NEEDED FOR SLEEP. What changed: See the new instructions.   valACYclovir 500 MG tablet Commonly known as: VALTREX Take 500 mg by mouth daily. As needed        Follow-up Information     [name], NP Follow up in 1 week(s).   Specialty: Adult Health Nurse Practitioner Contact information: [address] [place] [address] [phone]         [name], MD .   Specialty: Cardiology Contact information: [address] [place] [address] [phone]                Allergies  Allergen Reactions   Augmentin [Amoxicillin-Pot Clavulanate] Diarrhea     The results of significant diagnostics from this hospitalization (including imaging, microbiology, ancillary and laboratory) are listed below for reference.    Microbiology: Recent Results (from the past 240 hour(s))  C Difficile Quick Screen w PCR reflex     Status: None   Collection Time: 05/23/22  4:56 PM   Specimen: STOOL  Result Value Ref Range Status   C Diff antigen NEGATIVE NEGATIVE Final   C Diff toxin NEGATIVE NEGATIVE Final   C Diff interpretation No C. difficile detected.  Final    Comment: Performed at [facility], [place] [address], [place], [place] [address]    Procedures/Studies: DG Abd Portable 1V-Small Bowel Obstruction Protocol-initial, 8 hr delay  Result Date: 05/23/2022 CLINICAL DATA:  Small bowel protocol 8 hour delighted EXAM: PORTABLE ABDOMEN - 1 VIEW COMPARISON:  05/23/2022, CT 05/21/2022 FINDINGS: Air filled nondistended central small bowel with abundant colon gas. Dilute contrast present in the colon and rectum. IMPRESSION: Contrast is present in  the colon and rectum. There is decreased small bowel distension Electronically Signed   By: Donavan Foil M.D.   On: 05/23/2022 17:38   DG Abd Portable 1V  Result Date: 05/23/2022 CLINICAL DATA:  A 56 year old male presents for evaluation of small bowel obstruction. EXAM: PORTABLE ABDOMEN - 1 VIEW COMPARISON:  Abdomen and pelvis CT from May 21, 2022 FINDINGS: Stool and gas scattered throughout the colon to the level of the rectum. Potential distension of single small bowel loop in the LEFT lower quadrant up to 3.7 cm. Upper abdomen excluded from view on this KUB. No abnormal calcifications over the abdomen. On limited assessment no acute skeletal findings. IMPRESSION: Single gas-filled loop of small bowel in the LEFT lower quadrant with distension. Stool and gas throughout the colon. Findings raise the question of underlying segmental small-bowel dilation but with improvement when compared to previous  imaging. Electronically Signed   By: Zetta Bills M.D.   On: 05/23/2022 08:31   CT Abdomen Pelvis W Contrast  Result Date: 05/21/2022 CLINICAL DATA:  Abdominal pain, acute, nonlocalized. Abdominal pain that started last night; n/v/d , hx of abd problems EXAM: CT ABDOMEN AND PELVIS WITH CONTRAST TECHNIQUE: Multidetector CT imaging of the abdomen and pelvis was performed using the standard protocol following bolus administration of intravenous contrast. RADIATION DOSE REDUCTION: This exam was performed according to the departmental dose-optimization program which includes automated exposure control, adjustment of the mA and/or kV according to patient size and/or use of iterative reconstruction technique. CONTRAST:  189m OMNIPAQUE IOHEXOL 300 MG/ML  SOLN COMPARISON:  CT abdomen pelvis 02/22/2013 FINDINGS: Lower chest: No acute abnormality. Hepatobiliary: No focal liver abnormality. No gallstones, gallbladder wall thickening, or pericholecystic fluid. No biliary dilatation. Pancreas: No focal lesion. Normal pancreatic contour. No surrounding inflammatory changes. No main pancreatic ductal dilatation. Spleen: Normal in size without focal abnormality. Adrenals/Urinary Tract: No adrenal nodule bilaterally. Bilateral kidneys enhance symmetrically. Subcentimeter hypodensity too small to characterize. No hydronephrosis. No hydroureter. The urinary bladder is unremarkable. On delayed imaging, there is no urothelial wall thickening and there are no filling defects in the opacified portions of the bilateral collecting systems or ureters. Stomach/Bowel: Stomach is within normal limits. Several loops of small bowel within the mid anterior abdomen are noted to be distended with fluid as well as fecalized material. Query developing transition point within the right mid abdomen (6:64). Associated slight congestion of the mesenteric vasculature with trace free fluid. No evidence of large bowel wall thickening or dilatation.  Appendix appears normal. Vascular/Lymphatic: No abdominal aorta or iliac aneurysm. Mild atherosclerotic plaque of the aorta and its branches. No abdominal, pelvic, or inguinal lymphadenopathy. Reproductive: Prostate is unremarkable. Other: Trace free fluid within the mid abdomen. Trace free fluid within the pelvis. No intraperitoneal free gas. No organized fluid collection. Musculoskeletal: No abdominal wall hernia or abnormality. No suspicious lytic or blastic osseous lesions. No acute displaced fracture. Multilevel degenerative changes of the spine. IMPRESSION: 1. Findings suggestive of a partial or early small-bowel obstruction. Associated slight congestion of the mesenteric vasculature and trace free fluid. Likely developing transition point within the right mid abdomen. No pneumatosis or bowel perforation. 2.  Aortic Atherosclerosis (ICD10-I70.0). Electronically Signed   By: MIven FinnM.D.   On: 05/21/2022 19:40    Labs: BNP (last 3 results) No results for input(s): "BNP" in the last 8760 hours. Basic Metabolic Panel: Recent Labs  Lab 05/21/22 1631 05/22/22 0448 05/23/22 0434  NA 140 141 141  K 3.9 4.0 3.8  CL 102 108 108  CO2 '27 26 27  '$ GLUCOSE 116* 104* 84  BUN '17 15 16  '$ CREATININE 1.10 1.03 1.07  CALCIUM 10.0 8.5* 8.6*  MG  --   --  2.0   Liver Function Tests: Recent Labs  Lab 05/21/22 1631  AST 24  ALT 29  ALKPHOS 62  BILITOT 1.8*  PROT 7.1  ALBUMIN 4.8   Recent Labs  Lab 05/21/22 1631  LIPASE 13   No results for input(s): "AMMONIA" in the last 168 hours. CBC: Recent Labs  Lab 05/21/22 1631 05/22/22 0448 05/23/22 0434 05/23/22 1914 05/24/22 0408 05/25/22 0429  WBC 10.4 8.6 5.8  --  6.2 5.9  NEUTROABS 8.0*  --  2.7  --   --   --   HGB 16.1 13.9 13.4 13.8 13.5 13.7  HCT 46.4 41.1 39.8 41.1 39.8 40.5  MCV 85.5 88.8 88.6  --  88.2 87.9  PLT 201 152 150  --  152 150   Cardiac Enzymes: No results for input(s): "CKTOTAL", "CKMB", "CKMBINDEX", "TROPONINI"  in the last 168 hours. BNP: Invalid input(s): "POCBNP" CBG: No results for input(s): "GLUCAP" in the last 168 hours. D-Dimer No results for input(s): "DDIMER" in the last 72 hours. Hgb A1c No results for input(s): "HGBA1C" in the last 72 hours. Lipid Profile No results for input(s): "CHOL", "HDL", "LDLCALC", "TRIG", "CHOLHDL", "LDLDIRECT" in the last 72 hours. Thyroid function studies No results for input(s): "TSH", "T4TOTAL", "T3FREE", "THYROIDAB" in the last 72 hours.  Invalid input(s): "FREET3" Anemia work up No results for input(s): "VITAMINB12", "FOLATE", "FERRITIN", "TIBC", "IRON", "RETICCTPCT" in the last 72 hours. Urinalysis    Component Value Date/Time   COLORURINE YELLOW 05/21/2022 1822   APPEARANCEUR CLEAR 05/21/2022 1822   LABSPEC 1.021 05/21/2022 1822   PHURINE 8.5 (H) 05/21/2022 1822   GLUCOSEU NEGATIVE 05/21/2022 1822   GLUCOSEU NEGATIVE 07/29/2019 1344   HGBUR NEGATIVE 05/21/2022 1822   BILIRUBINUR NEGATIVE 05/21/2022 1822   BILIRUBINUR negative 07/29/2019 1556   KETONESUR NEGATIVE 05/21/2022 1822   PROTEINUR 30 (A) 05/21/2022 1822   UROBILINOGEN 0.2 07/29/2019 1556   UROBILINOGEN 0.2 07/29/2019 1344   NITRITE NEGATIVE 05/21/2022 1822   LEUKOCYTESUR NEGATIVE 05/21/2022 1822   Sepsis Labs Recent Labs  Lab 05/22/22 0448 05/23/22 0434 05/24/22 0408 05/25/22 0429  WBC 8.6 5.8 6.2 5.9   Microbiology Recent Results (from the past 240 hour(s))  C Difficile Quick Screen w PCR reflex     Status: None   Collection Time: 05/23/22  4:56 PM   Specimen: STOOL  Result Value Ref Range Status   C Diff antigen NEGATIVE NEGATIVE Final   C Diff toxin NEGATIVE NEGATIVE Final   C Diff interpretation No C. difficile detected.  Final    Comment: Performed at [facility], [place] [address], [place], [place] [address]     Time coordinating discharge: 25 minutes  SIGNED: Antonieta Pert, MD  [facility] 05/25/2022, 11:37 AM  If 7PM-7AM, please  contact night-coverage [email or web address]

## 2022-05-24 NOTE — Significant Event (Signed)
RN informed wife is not comfortable with going home today- wants to see how he does with further diet,RN notifying [facility], d/c held for today.

## 2022-05-25 DIAGNOSIS — K56609 Unspecified intestinal obstruction, unspecified as to partial versus complete obstruction: Secondary | ICD-10-CM | POA: Diagnosis not present

## 2022-05-25 LAB — CBC
HCT: 40.5 % (ref 39.0–52.0)
Hemoglobin: 13.7 g/dL (ref 13.0–17.0)
MCH: 29.7 pg (ref 26.0–34.0)
MCHC: 33.8 g/dL (ref 30.0–36.0)
MCV: 87.9 fL (ref 80.0–100.0)
Platelets: 150 10*3/uL (ref 150–400)
RBC: 4.61 MIL/uL (ref 4.22–5.81)
RDW: 12.2 % (ref 11.5–15.5)
WBC: 5.9 10*3/uL (ref 4.0–10.5)
nRBC: 0 % (ref 0.0–0.2)

## 2022-05-25 NOTE — Progress Notes (Signed)
PROGRESS NOTE Francisco Pena  PPI:[id number] DOB: September 11, 1966 DOA: 05/21/2022 PCP: [name], NP   Brief Narrative/Hospital Course: 56 year old male with history of HLD, GERD, OSA brother with colon polyps in his 45s, previous colonoscopy normal 2 to 3 years ago presented to [facility] with abd pain and nausea 05/20/22. In ED CT showed early or partial SBO. He is a 56 yr old with anxiety, GERD, and OSA who presents with abdominal pain and N/V/D since night of 7/21 and has CT findings concerning for partial or early SBO, general surgery consulted and patient was admitted to hospital for further management.  Small bowel follow-through showed contrast in the rectum and colon, patient having bowel movement, diet advanced per surgery.  He had loose stool/diarrhea blackish discolored but negative for Hemoccult and C. difficile.  Diet is advanced once tolerating he will be discharged home   Subjective: Seen and examined this morning, he feels well had solid BM and tolerated diet and wants to go home this morning   Assessment and Plan: Principal Problem:   SBO (small bowel obstruction) ([place]) Active Problems:   Essential (primary) hypertension  Possible SBO in the setting of enteritis: Appreciate surgery input.  At this time resume advanced diet tolerated well he stayed overnight to ensure.  Had solid BM.  He will be discharged home today.  Discussed with surgery. C. difficile negative Hemoccult negative hemoglobin is stable   Essential hypertension: Stable, continue Toprol HLD on Lipitor to be resumed at home  Mood disorder on trazodone , Neurontin and Zoloft continue GERD: added PPI on discharge, will follow up with Dr. Benson Norway from GI  DVT prophylaxis:  Code Status:   Code Status: Full Code Family Communication: plan of care discussed with patient at bedside.  Level of care: Med-Surg  Dispo: The patient is from: home            Anticipated disposition: home today  Mobility Assessment (last 72  hours)     Mobility Assessment     Row Name 05/24/22 20:45:35 05/23/22 2003 05/22/22 2004       Does patient have an order for bedrest or is patient medically unstable No - Continue assessment No - Continue assessment No - Continue assessment     What is the highest level of mobility based on the progressive mobility assessment? Level 6 (Walks independently in room and hall) - Balance while walking in room without assist - Complete Level 6 (Walks independently in room and hall) - Balance while walking in room without assist - Complete Level 6 (Walks independently in room and hall) - Balance while walking in room without assist - Complete               Objective: Vitals last 24 hrs: Vitals:   05/24/22 0421 05/24/22 0906 05/24/22 1342 05/24/22 2045  BP: 129/74 136/79 138/78 135/80  Pulse: (!) 58 (!) 56 63 61  Resp: '18 17 18 16  '$ Temp: 97.9 F (36.6 C) 98 F (36.7 C)  98 F (36.7 C)  TempSrc: Oral Oral  Oral  SpO2: 97% 98% 99% 97%  Weight:      Height:       Weight change:   Physical Examination: General exam: alert awake,older than stated age, weak appearing. HEENT:Oral mucosa moist, Ear/Nose WNL grossly, dentition normal. Respiratory system: bilaterally diminished BS, no use of accessory muscle Cardiovascular system: S1 & S2 +, No JVD. Gastrointestinal system: Abdomen soft,NT,ND, BS+ Nervous System:Alert, awake, moving extremities and grossly nonfocal Extremities:  LE edema neg,distal peripheral pulses palpable.  Skin: No rashes,no icterus. MSK: Normal muscle bulk,tone, power  Medications reviewed:  Scheduled Meds:  atorvastatin  20 mg Oral Daily   Benzocaine   Mouth/Throat Once   enoxaparin (LOVENOX) injection  40 mg Subcutaneous Q24H   gabapentin  600 mg Oral QHS   metoprolol succinate  12.5 mg Oral QHS   multivitamin with minerals  1 tablet Oral Daily   ondansetron (ZOFRAN) IV  4 mg Intravenous Once   pantoprazole  40 mg Oral Daily   sertraline  150 mg Oral  Daily   Continuous Infusions:  lactated ringers Stopped (05/24/22 2304)      Diet Order             DIET SOFT Room service appropriate? Yes; Fluid consistency: Thin  Diet effective now                            Intake/Output Summary (Last 24 hours) at 05/25/2022 1135 Last data filed at 05/25/2022 0500 Gross per 24 hour  Intake 3442.7 ml  Output 2850 ml  Net 592.7 ml   Net IO Since Admission: 1,516.07 mL [05/25/22 1135]  Wt Readings from Last 3 Encounters:  05/21/22 99.8 kg  04/13/22 101.7 kg  03/10/22 101.2 kg     Unresulted Labs (From admission, onward)     Start     Ordered   05/24/22 0500  CBC  Daily at 5am,   R      05/23/22 1205          Data Reviewed: I have personally reviewed following labs and imaging studies CBC: Recent Labs  Lab 05/21/22 1631 05/22/22 0448 05/23/22 0434 05/23/22 1914 05/24/22 0408 05/25/22 0429  WBC 10.4 8.6 5.8  --  6.2 5.9  NEUTROABS 8.0*  --  2.7  --   --   --   HGB 16.1 13.9 13.4 13.8 13.5 13.7  HCT 46.4 41.1 39.8 41.1 39.8 40.5  MCV 85.5 88.8 88.6  --  88.2 87.9  PLT 201 152 150  --  152 622   Basic Metabolic Panel: Recent Labs  Lab 05/21/22 1631 05/22/22 0448 05/23/22 0434  NA 140 141 141  K 3.9 4.0 3.8  CL 102 108 108  CO2 '27 26 27  '$ GLUCOSE 116* 104* 84  BUN '17 15 16  '$ CREATININE 1.10 1.03 1.07  CALCIUM 10.0 8.5* 8.6*  MG  --   --  2.0   GFR: Estimated Creatinine Clearance: 100.7 mL/min (by C-G formula based on SCr of 1.07 mg/dL). Liver Function Tests: Recent Labs  Lab 05/21/22 1631  AST 24  ALT 29  ALKPHOS 62  BILITOT 1.8*  PROT 7.1  ALBUMIN 4.8   Recent Labs  Lab 05/21/22 1631  LIPASE 13   No results for input(s): "AMMONIA" in the last 168 hours. Coagulation Profile: No results for input(s): "INR", "PROTIME" in the last 168 hours. BNP (last 3 results) No results for input(s): "PROBNP" in the last 8760 hours. HbA1C: No results for input(s): "HGBA1C" in the last 72  hours. CBG: No results for input(s): "GLUCAP" in the last 168 hours. Lipid Profile: No results for input(s): "CHOL", "HDL", "LDLCALC", "TRIG", "CHOLHDL", "LDLDIRECT" in the last 72 hours. Thyroid Function Tests: No results for input(s): "TSH", "T4TOTAL", "FREET4", "T3FREE", "THYROIDAB" in the last 72 hours. Sepsis Labs: No results for input(s): "PROCALCITON", "LATICACIDVEN" in the last 168 hours.  Recent Results (from the past  240 hour(s))  C Difficile Quick Screen w PCR reflex     Status: None   Collection Time: 05/23/22  4:56 PM   Specimen: STOOL  Result Value Ref Range Status   C Diff antigen NEGATIVE NEGATIVE Final   C Diff toxin NEGATIVE NEGATIVE Final   C Diff interpretation No C. difficile detected.  Final    Comment: Performed at [facility], [place] [address] [place], [place] [address]    Antimicrobials: Anti-infectives (From admission, onward)    None      Culture/Microbiology No results found for: "SDES", "SPECREQUEST", "CULT", "REPTSTATUS"   Radiology Studies: DG Abd Portable 1V-Small Bowel Obstruction Protocol-initial, 8 hr delay  Result Date: 05/23/2022 CLINICAL DATA:  Small bowel protocol 8 hour delighted EXAM: PORTABLE ABDOMEN - 1 VIEW COMPARISON:  05/23/2022, CT 05/21/2022 FINDINGS: Air filled nondistended central small bowel with abundant colon gas. Dilute contrast present in the colon and rectum. IMPRESSION: Contrast is present in the colon and rectum. There is decreased small bowel distension Electronically Signed   By: Donavan Foil M.D.   On: 05/23/2022 17:38     LOS: 4 days   Antonieta Pert, MD [facility]  05/25/2022, 11:35 AM

## 2022-05-25 NOTE — Progress Notes (Addendum)
Patient notified NT and myself that he did not want his vitals taken at 0200 or 0600 this AM.

## 2022-05-25 NOTE — Progress Notes (Signed)
Discharge instructions discussed with patient and family, verbalized agreement and understanding 

## 2022-06-10 ENCOUNTER — Telehealth (INDEPENDENT_AMBULATORY_CARE_PROVIDER_SITE_OTHER): Payer: BC Managed Care – PPO | Admitting: Family Medicine

## 2022-06-10 VITALS — Wt 217.0 lb

## 2022-06-10 DIAGNOSIS — F4322 Adjustment disorder with anxiety: Secondary | ICD-10-CM | POA: Diagnosis not present

## 2022-06-10 DIAGNOSIS — F419 Anxiety disorder, unspecified: Secondary | ICD-10-CM | POA: Diagnosis not present

## 2022-06-10 DIAGNOSIS — F5102 Adjustment insomnia: Secondary | ICD-10-CM

## 2022-06-10 MED ORDER — HYDROXYZINE HCL 10 MG PO TABS: 10.0000 mg | ORAL_TABLET | Freq: Three times a day (TID) | ORAL | 0 refills | Status: DC | PRN

## 2022-06-10 NOTE — Patient Instructions (Signed)
Good talking with you today.  Sorry to hear anxiety symptoms have worsened.  I think it is reasonable to try increasing your sertraline to 2 pills/day or total dose of 200 mg.  Hydroxyzine also prescribed if needed for breakthrough anxiety symptoms or sleep.  Do not combine that with trazodone, 1 or the other.  Continue exercises that can be helpful for managing anxiety and mood symptoms.  See information below on managing anxiety as well as adjustment disorder which may be the case with increased symptoms since recent hospitalization.  It may be beneficial to meet again with a therapist, but can see how the medications are working in the next few weeks.  Let me know if you need numbers.  Send [place] an update in the next month and if still at the 200 mg dose of sertraline, I can adjust the refills.  Take care!  Return to the clinic or go to the nearest emergency room if any of your symptoms worsen or new symptoms occur.  Adjustment Disorder, Adult Adjustment disorder is a group of symptoms that can develop after a stressful life event, such as the loss of a job or a serious physical illness. The symptoms can affect how you feel, think, and act. They may also interfere with your relationships. Adjustment disorder increases your risk of suicide and substance abuse. If adjustment disorder is not managed early, it can make medical conditions that you already have worse. If the stressful life event persists, the disorder may continue and become a persistent form of adjustment disorder. What are the causes? This condition is caused by difficulty recovering from or coping with a stressful life event. What increases the risk? You are more likely to develop this condition if: You have had previous problems coping with life stressors. You are being treated for a long-term (chronic) illness. You are being treated for an illness that cannot be cured (terminal illness). You have a family history of mental  illness. What are the signs or symptoms? Symptoms of this condition include: Behavioral symptoms such as: Trouble doing daily tasks. Reckless driving. Poor work Systems analyst. Ignoring bills. Avoiding family and friends. Impulsive actions. Emotional symptoms such as: Sadness, depression, or crying spells. Worrying a lot, or feeling nervous or anxious. Loss of enjoyment. Feelings of loss or hopelessness. Irritability. Thoughts of suicide. Physical symptoms such as: Change in appetite or weight. Complaining of feeling sick without being ill. Feeling dazed or disconnected. Nightmares. Trouble sleeping. Symptoms of this condition start within 3 months of the stressful event. They do not last more than 6 months, unless the stressful circumstances last longer. Normal grieving after the death of a loved one is not a symptom of this condition. How is this diagnosed? To diagnose this condition, your health care provider will ask about what has happened in your life and how it has affected you. He or she may also ask about your medical history and your use of medicines, alcohol, and other substances. Your health care provider may do a physical exam and order lab tests or other studies. You may be referred to a mental health specialist. How is this treated? Treatment options for this condition include: Counseling or talk therapy. Talk therapy is usually provided by mental health specialists. This therapy may be individual or may involve family members. Medicines. Certain medicines may help with depression, anxiety, and sleep. Support groups. These offer emotional support, advice, and guidance. They are made up of people who have had similar experiences. Observation and  time. This is sometimes called watchful waiting. In this treatment, health care providers monitor your health and behavior without other treatment. Adjustment disorder sometimes gets better on its own with time. Follow these  instructions at home: Take over-the-counter and prescription medicines only as told by your health care provider. Keep all follow-up visits. This is important. Contact trusted family and friends for support. Let them know what is going on with you and how they can help. Contact a health care provider if: Your symptoms do not improve in 6 months. Your symptoms get worse. Get help right away if: You have serious thoughts about hurting yourself or someone else. If you ever feel like you may hurt yourself or others, or have thoughts about taking your own life, get help right away. Go to your nearest emergency department or: Call your local emergency services (911 in the [place] Call a suicide crisis helpline, such as the [facility] at (407) 036-7087 or 988 in the [place]. This is open 24 hours a day in the [place] Text the Crisis Text Line at 903-711-3999 (in the [place] Summary Adjustment disorder is a group of symptoms that can develop after a stressful life event, such as the loss of a job or a serious physical illness. The symptoms can affect how you feel, think, and act. They may interfere with your relationships. Symptoms of this condition start within 3 months of the stressful event. They do not last more than 6 months, unless the stressful circumstances last longer. Treatment may include talk therapy, medicines, participation in a support group, or observation to see if symptoms improve. Contact your health care provider if your symptoms get worse or do not improve in 6 months. If you ever feel like you may hurt yourself or others, or have thoughts about taking your own life, get help right away. This information is not intended to replace advice given to you by your health care provider. Make sure you discuss any questions you have with your health care provider. Document Revised: 05/12/2021 Document Reviewed: 02/28/2020 Elsevier Patient Education  [place], Adult After being diagnosed with anxiety, you may be relieved to know why you have felt or behaved a certain way. You may also feel overwhelmed about the treatment ahead and what it will mean for your life. With care and support, you can manage this condition. How to manage lifestyle changes Managing stress and anxiety  Stress is your body's reaction to life changes and events, both good and bad. Most stress will last just a few hours, but stress can be ongoing and can lead to more than just stress. Although stress can play a major role in anxiety, it is not the same as anxiety. Stress is usually caused by something external, such as a deadline, test, or competition. Stress normally passes after the triggering event has ended.  Anxiety is caused by something internal, such as imagining a terrible outcome or worrying that something will go wrong that will devastate you. Anxiety often does not go away even after the triggering event is over, and it can become long-term (chronic) worry. It is important to understand the differences between stress and anxiety and to manage your stress effectively so that it does not lead to an anxious response. Talk with your health care provider or a counselor to learn more about reducing anxiety and stress. He or she may suggest tension reduction techniques, such as: Music therapy. Spend time creating or listening  to music that you enjoy and that inspires you. Mindfulness-based meditation. Practice being aware of your normal breaths while not trying to control your breathing. It can be done while sitting or walking. Centering prayer. This involves focusing on a word, phrase, or sacred image that means something to you and brings you peace. Deep breathing. To do this, expand your stomach and inhale slowly through your nose. Hold your breath for 3-5 seconds. Then exhale slowly, letting your stomach muscles relax. Self-talk. Learn to notice and  identify thought patterns that lead to anxiety reactions and change those patterns to thoughts that feel peaceful. Muscle relaxation. Taking time to tense muscles and then relax them. Choose a tension reduction technique that fits your lifestyle and personality. These techniques take time and practice. Set aside 5-15 minutes a day to do them. Therapists can offer counseling and training in these techniques. The training to help with anxiety may be covered by some insurance plans. Other things you can do to manage stress and anxiety include: Keeping a stress diary. This can help you learn what triggers your reaction and then learn ways to manage your response. Thinking about how you react to certain situations. You may not be able to control everything, but you can control your response. Making time for activities that help you relax and not feeling guilty about spending your time in this way. Doing visual imagery. This involves imagining or creating mental pictures to help you relax. Practicing yoga. Through yoga poses, you can lower tension and promote relaxation.  Medicines Medicines can help ease symptoms. Medicines for anxiety include: Antidepressant medicines. These are usually prescribed for long-term daily control. Anti-anxiety medicines. These may be added in severe cases, especially when panic attacks occur. Medicines will be prescribed by a health care provider. When used together, medicines, psychotherapy, and tension reduction techniques may be the most effective treatment. Relationships Relationships can play a big part in helping you recover. Try to spend more time connecting with trusted friends and family members. Consider going to couples counseling if you have a partner, taking family education classes, or going to family therapy. Therapy can help you and others better understand your condition. How to recognize changes in your anxiety Everyone responds differently to treatment  for anxiety. Recovery from anxiety happens when symptoms decrease and stop interfering with your daily activities at home or work. This may mean that you will start to: Have better concentration and focus. Worry will interfere less in your daily thinking. Sleep better. Be less irritable. Have more energy. Have improved memory. It is also important to recognize when your condition is getting worse. Contact your health care provider if your symptoms interfere with home or work and you feel like your condition is not improving. Follow these instructions at home: Activity Exercise. Adults should do the following: Exercise for at least 150 minutes each week. The exercise should increase your heart rate and make you sweat (moderate-intensity exercise). Strengthening exercises at least twice a week. Get the right amount and quality of sleep. Most adults need 7-9 hours of sleep each night. Lifestyle  Eat a healthy diet that includes plenty of vegetables, fruits, whole grains, low-fat dairy products, and lean protein. Do not eat a lot of foods that are high in fats, added sugars, or salt (sodium). Make choices that simplify your life. Do not use any products that contain nicotine or tobacco. These products include cigarettes, chewing tobacco, and vaping devices, such as e-cigarettes. If you need help quitting,  ask your health care provider. Avoid caffeine, alcohol, and certain over-the-counter cold medicines. These may make you feel worse. Ask your pharmacist which medicines to avoid. General instructions Take over-the-counter and prescription medicines only as told by your health care provider. Keep all follow-up visits. This is important. Where to find support You can get help and support from these sources: Self-help groups. Online and [employer]. A trusted spiritual leader. Couples counseling. Family education classes. Family therapy. Where to find more information You may  find that joining a support group helps you deal with your anxiety. The following sources can help you locate counselors or support groups near you: [place]: www.mentalhealthamerica.net Anxiety and Depression Association of [place] (ADAA): https://www.[place].net/ National Alliance on Mental Illness (NAMI): www.nami.org Contact a health care provider if: You have a hard time staying focused or finishing daily tasks. You spend many hours a day feeling worried about everyday life. You become exhausted by worry. You start to have headaches or frequently feel tense. You develop chronic nausea or diarrhea. Get help right away if: You have a racing heart and shortness of breath. You have thoughts of hurting yourself or others. If you ever feel like you may hurt yourself or others, or have thoughts about taking your own life, get help right away. Go to your nearest emergency department or: Call your local emergency services (911 in the [place] Call a suicide crisis helpline, such as the [facility] at 484-803-0559 or 988 in the [place]. This is open 24 hours a day in the [place] Text the Crisis Text Line at (225)306-4760 (in the [place].). Summary Taking steps to learn and use tension reduction techniques can help calm you and help prevent triggering an anxiety reaction. When used together, medicines, psychotherapy, and tension reduction techniques may be the most effective treatment. Family, friends, and partners can play a big part in supporting you. This information is not intended to replace advice given to you by your health care provider. Make sure you discuss any questions you have with your health care provider. Document Revised: 05/12/2021 Document Reviewed: 02/07/2021 Elsevier Patient Education  [place].

## 2022-06-10 NOTE — Progress Notes (Signed)
Virtual Visit via Video Note  I connected with Francisco Pena on 06/10/22 at 11:54 AM by a video enabled telemedicine application and verified that I am speaking with the correct person using two identifiers.  Patient location: home, wife at home, consent obtained to discuss PHI.  My location: office - [place].    I discussed the limitations, risks, security and privacy concerns of performing an evaluation and management service by telephone and the availability of in person appointments. I also discussed with the patient that there may be a patient responsible charge related to this service. The patient expressed understanding and agreed to proceed, consent obtained  Chief complaint:  Chief Complaint  Patient presents with   Anxiety    Pt is having visit at home Today  Pt has been taking sertraline and has worsened at night would like to increase or change     History of Present Illness: Francisco Pena is a 56 y.o. male  Anxiety: Patient of Francisco Pena.  Chart reviewed.  Anxiety last discussed on May 11.  Sertraline '150mg'$  daily at that time.  Reported good effect, continued same dose.  Was also treated with gabapentin 600 mg at bedtime for restless leg syndrome. Few weeks ago in hospital for small bowel obstruction. More anxious since that time worse at night. Some HA when anxiety. Has not checked BP. No chest pain.  No SI/HI.  No alcohol, marijuana, CBD. No current counselor - few years ago.  1/4 trazodone working well to get to sleep - no recent alprazolam.  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 Exercising 5 days per week.   trazodone was filled in June for insomnia, previously was taking alprazolam for sleep, half pill 2-3 times per week.  He does have a history of sleep apnea, treated with CPAP.     06/10/2022   11:22 AM 03/10/2022   11:11 AM 01/05/2022    8:25 AM 12/31/2021    2:59 PM 08/31/2021   10:35 AM   Depression screen PHQ 2/9  Decreased Interest 0 0 0 0 0  Down, Depressed, Hopeless 0 0 0 0 0  PHQ - 2 Score 0 0 0 0 0  Altered sleeping 0 0 0 0 0  Tired, decreased energy 1 0 0 0 0  Change in appetite 0 0 0 0 0  Feeling bad or failure about yourself  1 0 0 0 0  Trouble concentrating 0 0 0 0 0  Moving slowly or fidgety/restless 0 0 0 0 0  Suicidal thoughts 0 0 0 0 0  PHQ-9 Score 2 0 0 0 0  Difficult doing work/chores  Not difficult at all Not difficult at all        06/10/2022   11:20 AM  GAD 7 : Generalized Anxiety Score  Nervous, Anxious, on Edge 3  Control/stop worrying 3  Worry too much - different things 2  Trouble relaxing 1  Restless 1  Easily annoyed or irritable 1  Afraid - awful might happen 2  Total GAD 7 Score 13      Patient Active Problem List   Diagnosis Date Noted   SBO (small bowel obstruction) ([place]) 05/21/2022   Insomnia 11/01/2021   Anxiety 01/11/2021   GERD (gastroesophageal reflux disease) 02/04/2020   Body mass index (BMI) 28.0-28.9, adult 11/15/2019   Essential (primary) hypertension 11/15/2019   Visit for preventive health examination 06/12/2018   Prostate cancer screening 06/12/2018  Obstructive sleep apnea 03/02/2017   Palpitations 03/02/2017   Restless leg syndrome 11/14/2016   Displacement of lumbar intervertebral disc without myelopathy 05/19/2016   Generalized anxiety disorder 02/24/2015   Hyperlipidemia LDL goal <130 02/24/2015   Hypertriglyceridemia 02/24/2015   Past Medical History:  Diagnosis Date   Allergy    Anxiety and depression    Atypical mole 06/11/2010   Lower Mid Back (mild to moderate)   Atypical mole 06/11/2010   Right Abdomen (mild)   Heart palpitations    Hyperlipidemia    Prostate infection    Sleep apnea    CPAP    Past Surgical History:  Procedure Laterality Date   CERVICAL SPINE SURGERY     artificial discs in neck   COLONOSCOPY  at age 26    [place]normal exam per pt   SPINE SURGERY  Right    disc shaved on right side x2   TONSILLECTOMY     Allergies  Allergen Reactions   Augmentin [Amoxicillin-Pot Clavulanate] Diarrhea   Prior to Admission medications   Medication Sig Start Date End Date Taking? Authorizing Provider  atorvastatin (LIPITOR) 20 MG tablet Take 1 tablet (20 mg total) by mouth daily. 03/10/22  Yes Francisco Coss, NP  gabapentin (NEURONTIN) 300 MG capsule Take 2 capsules (600 mg total) by mouth at bedtime. 03/10/22  Yes Francisco Coss, NP  metoprolol succinate (TOPROL-XL) 25 MG 24 hr tablet Take 0.5 tablets (12.5 mg total) by mouth daily. Patient taking differently: Take 12.5 mg by mouth at bedtime. 03/10/22  Yes Francisco Coss, NP  Multiple Vitamin (MULTIVITAMIN) tablet Take 1 tablet by mouth daily.   Yes [provider]  pantoprazole (PROTONIX) 40 MG tablet Take 1 tablet (40 mg total) by mouth daily. 05/24/22 06/23/22 Yes Antonieta Pert, MD  sertraline (ZOLOFT) 100 MG tablet TAKE 1.5 TABLETS ('150MG'$  TOTAL) BY MOUTH DAILY Patient taking differently: Take 150 mg by mouth daily. 03/10/22  Yes Francisco Coss, NP  traZODone (DESYREL) 50 MG tablet TAKE 0.5-1 TABLETS BY MOUTH AT BEDTIME AS NEEDED FOR SLEEP. 05/23/22  Yes Francisco Coss, NP  valACYclovir (VALTREX) 500 MG tablet Take 500 mg by mouth daily. As needed   Yes [provider]   Social History   Socioeconomic History   Marital status: Married    Spouse name: Not on file   Number of children: 4   Years of education: Not on file   Highest education level: Not on file  Occupational History   Occupation: Teacher  Tobacco Use   Smoking status: Never   Smokeless tobacco: Never  Vaping Use   Vaping Use: Never used  Substance and Sexual Activity   Alcohol use: Yes    Comment: occ beer   Drug use: No   Sexual activity: Yes    Partners: Female  Other Topics Concern   Not on file  Social History Narrative   HS Teacher at [employer] 2014 - children from both prior  marriages spend most of their time at their house.   Social Determinants of Health   Financial Resource Strain: Not on file  Food Insecurity: Not on file  Transportation Needs: Not on file  Physical Activity: Sufficiently Active (01/18/2018)   Exercise Vital Sign    Days of Exercise per Week: 4 days    Minutes of Exercise per Session: 40 min  Stress: Not on file  Social Connections: Not on file  Intimate Partner Violence: Not on file    Observations/Objective: Vitals:  06/10/22 1119  Weight: 217 lb (98.4 kg)  Nontoxic appearance on video, speaking in full sentences.  No respiratory distress.  Euthymic mood.  Appropriate responses, does not appear to be responding to internal stimuli.  All questions answered with understanding of plan expressed.   Assessment and Plan: Anxiety - Plan: hydrOXYzine (ATARAX) 10 MG tablet  Adjustment disorder with anxious mood - Plan: hydrOXYzine (ATARAX) 10 MG tablet  Adjustment insomnia - Plan: hydrOXYzine (ATARAX) 10 MG tablet Underlying anxiety with worsening symptoms since hospitalization, suspect component of adjustment disorder.  Previous insomnia stable with trazodone treatment.  Handout given on managing anxiety, adjustment disorder, hydroxyzine if needed temporarily, and can replace trazodone at night if needed, do not combine.  Increase sertraline to 200 mg daily for now with serotonin syndrome precautions given if continues trazodone.  Option of repeat counseling.  Update on symptoms in the next 1 month and can decide if continued 200 mg of Zoloft dosing needed.  RTC precautions given.  Follow Up Instructions: As needed with update by MyChart next 1 month.   I discussed the assessment and treatment plan with the patient. The patient was provided an opportunity to ask questions and all were answered. The patient agreed with the plan and demonstrated an understanding of the instructions.   The patient was advised to call back or seek an  in-person evaluation if the symptoms worsen or if the condition fails to improve as anticipated.   Wendie Agreste, MD

## 2022-06-27 ENCOUNTER — Other Ambulatory Visit (HOSPITAL_COMMUNITY): Payer: Self-pay

## 2022-09-26 ENCOUNTER — Other Ambulatory Visit: Payer: Self-pay | Admitting: Family

## 2022-09-26 DIAGNOSIS — G2581 Restless legs syndrome: Secondary | ICD-10-CM

## 2022-09-26 MED ORDER — GABAPENTIN 300 MG PO CAPS
600.0000 mg | ORAL_CAPSULE | Freq: Every day | ORAL | 1 refills | Status: DC
Start: 2022-09-26 — End: 2023-03-31

## 2022-09-26 NOTE — Telephone Encounter (Signed)
Patient called stating that he needed a refill on his gabapentin. I let patient know that he would need a follow up for that, which I have set him up for. I also let him know that he would need to find a new PCP so that he would not have any gaps in his care. Patient wanted to know if a couple of pills could be sent in just for tonight because his restless leg syndrome was killing him. I let him know that I would ask and inform him when that had been completed

## 2022-09-27 ENCOUNTER — Encounter: Payer: Self-pay | Admitting: Family

## 2022-09-27 ENCOUNTER — Telehealth (INDEPENDENT_AMBULATORY_CARE_PROVIDER_SITE_OTHER): Payer: BC Managed Care – PPO | Admitting: Family

## 2022-09-27 VITALS — Ht 74.0 in | Wt 220.0 lb

## 2022-09-27 DIAGNOSIS — F419 Anxiety disorder, unspecified: Secondary | ICD-10-CM

## 2022-09-27 DIAGNOSIS — I10 Essential (primary) hypertension: Secondary | ICD-10-CM | POA: Diagnosis not present

## 2022-09-27 MED ORDER — SERTRALINE HCL 100 MG PO TABS: ORAL_TABLET | ORAL | 1 refills | Status: DC

## 2022-09-27 MED ORDER — VALACYCLOVIR HCL 500 MG PO TABS: 500.0000 mg | ORAL_TABLET | Freq: Every day | ORAL | 1 refills | Status: AC

## 2022-09-27 NOTE — Progress Notes (Signed)
Virtual Visit via Video   I connected with patient on 09/27/22 at 11:00 AM EST by a video enabled telemedicine application and verified that I am speaking with the correct person using two identifiers.  Location patient: Home Location provider: [facility] Persons participating in the virtual visit: Patient, Provider, CMA  I discussed the limitations of evaluation and management by telemedicine and the availability of in person appointments. The patient expressed understanding and agreed to proceed.  Subjective:   HPI:  56 year old male presents virtually for a recheck of HTN and Anxiety. He reports doing well. Is currently stable on his medications. Needs refills. No feeling of helplessness, hopelessness, no thoughts of death or dying.   ROS:   See pertinent positives and negatives per HPI.  Patient Active Problem List   Diagnosis Date Noted   SBO (small bowel obstruction) ([facility]) 05/21/2022   Insomnia 11/01/2021   Anxiety 01/11/2021   GERD (gastroesophageal reflux disease) 02/04/2020   Body mass index (BMI) 28.0-28.9, adult 11/15/2019   Essential (primary) hypertension 11/15/2019   Visit for preventive health examination 06/12/2018   Prostate cancer screening 06/12/2018   Obstructive sleep apnea 03/02/2017   Palpitations 03/02/2017   Restless leg syndrome 11/14/2016   Displacement of lumbar intervertebral disc without myelopathy 05/19/2016   Generalized anxiety disorder 02/24/2015   Hyperlipidemia LDL goal <130 02/24/2015   Hypertriglyceridemia 02/24/2015    Social History   Tobacco Use   Smoking status: Never   Smokeless tobacco: Never  Substance Use Topics   Alcohol use: Yes    Comment: occ beer    Current Outpatient Medications:    atorvastatin (LIPITOR) 20 MG tablet, Take 1 tablet (20 mg total) by mouth daily., Disp: 90 tablet, Rfl: 1   gabapentin (NEURONTIN) 300 MG capsule, Take 2 capsules (600 mg total) by mouth at bedtime., Disp: 180  capsule, Rfl: 1   metoprolol succinate (TOPROL-XL) 25 MG 24 hr tablet, Take 0.5 tablets (12.5 mg total) by mouth daily. (Patient taking differently: Take 12.5 mg by mouth at bedtime.), Disp: 45 tablet, Rfl: 3   Multiple Vitamin (MULTIVITAMIN) tablet, Take 1 tablet by mouth daily., Disp: , Rfl:    pantoprazole (PROTONIX) 40 MG tablet, 1 tab(s) orally once a day for 30 day(s), Disp: , Rfl:    traZODone (DESYREL) 50 MG tablet, TAKE 0.5-1 TABLETS BY MOUTH AT BEDTIME AS NEEDED FOR SLEEP., Disp: 90 tablet, Rfl: 2   hydrOXYzine (ATARAX) 10 MG tablet, Take 1 tablet (10 mg total) by mouth 3 (three) times daily as needed for anxiety (or sleep.). (Patient not taking: Reported on 09/27/2022), Disp: 30 tablet, Rfl: 0   pantoprazole (PROTONIX) 40 MG tablet, Take 1 tablet (40 mg total) by mouth daily. (Patient taking differently: Take 40 mg by mouth daily. Pt is taking), Disp: 30 tablet, Rfl: 0   sertraline (ZOLOFT) 100 MG tablet, TAKE 1.5 TABLETS ('150MG'$  TOTAL) BY MOUTH DAILY, Disp: 135 tablet, Rfl: 1   valACYclovir (VALTREX) 500 MG tablet, Take 1 tablet (500 mg total) by mouth daily. As needed, Disp: 90 tablet, Rfl: 1  Allergies  Allergen Reactions   Augmentin [Amoxicillin-Pot Clavulanate] Diarrhea    Objective:   Ht '6\' 2"'$  (1.88 m)   Wt 220 lb (99.8 kg)   BMI 28.25 kg/m   Patient is well-developed, well-nourished in no acute distress.  Resting comfortably at home.  Head is normocephalic, atraumatic.  No labored breathing.  Speech is clear and coherent with logical content.  Patient is alert  and oriented at baseline.    Assessment and Plan:   Sabastion was seen today for medication refill.  Diagnoses and all orders for this visit:  Essential (primary) hypertension  Anxiety -     sertraline (ZOLOFT) 100 MG tablet; TAKE 1.5 TABLETS ('150MG'$  TOTAL) BY MOUTH DAILY  Other orders -     valACYclovir (VALTREX) 500 MG tablet; Take 1 tablet (500 mg total) by mouth daily. As needed   Call the office  with any questions or concerns. Establish with a PCP and recheck in the office in 6 months and sooner as needed.   Kennyth Arnold, FNP 09/27/2022  Time spent with the patient: 20 minutes, of which >50% was spent in obtaining information about symptoms, reviewing previous labs, evaluations, and treatments, counseling about condition (please see the discussed topics above), and developing a plan to further investigate it; had a number of questions which I addressed.

## 2022-10-20 ENCOUNTER — Other Ambulatory Visit: Payer: Self-pay | Admitting: Family Medicine

## 2022-10-20 ENCOUNTER — Ambulatory Visit (INDEPENDENT_AMBULATORY_CARE_PROVIDER_SITE_OTHER): Payer: BC Managed Care – PPO | Admitting: Family Medicine

## 2022-10-20 ENCOUNTER — Encounter: Payer: Self-pay | Admitting: Family Medicine

## 2022-10-20 VITALS — BP 130/72 | HR 63 | Temp 98.2°F | Ht 74.0 in | Wt 230.8 lb

## 2022-10-20 DIAGNOSIS — N529 Male erectile dysfunction, unspecified: Secondary | ICD-10-CM

## 2022-10-20 DIAGNOSIS — F419 Anxiety disorder, unspecified: Secondary | ICD-10-CM

## 2022-10-20 DIAGNOSIS — E785 Hyperlipidemia, unspecified: Secondary | ICD-10-CM

## 2022-10-20 DIAGNOSIS — I10 Essential (primary) hypertension: Secondary | ICD-10-CM

## 2022-10-20 LAB — COMPREHENSIVE METABOLIC PANEL
ALT: 26 U/L (ref 0–53)
AST: 28 U/L (ref 0–37)
Albumin: 4.6 g/dL (ref 3.5–5.2)
Alkaline Phosphatase: 65 U/L (ref 39–117)
BUN: 16 mg/dL (ref 6–23)
CO2: 30 mEq/L (ref 19–32)
Calcium: 9.7 mg/dL (ref 8.4–10.5)
Chloride: 102 mEq/L (ref 96–112)
Creatinine, Ser: 1.18 mg/dL (ref 0.40–1.50)
GFR: 70.01 mL/min (ref >60.00)
Glucose, Bld: 96 mg/dL (ref 70–99)
Potassium: 4.6 mEq/L (ref 3.5–5.1)
Sodium: 140 mEq/L (ref 135–145)
Total Bilirubin: 1.4 mg/dL — ABNORMAL HIGH (ref 0.2–1.2)
Total Protein: 7.1 g/dL (ref 6.0–8.3)

## 2022-10-20 LAB — LIPID PANEL
Cholesterol: 175 mg/dL (ref 0–200)
HDL: 55.9 mg/dL (ref >39.00)
NonHDL: 119.58
Total CHOL/HDL Ratio: 3
Triglycerides: 269 mg/dL — ABNORMAL HIGH (ref 0.0–149.0)
VLDL: 53.8 mg/dL — ABNORMAL HIGH (ref 0.0–40.0)

## 2022-10-20 LAB — LDL CHOLESTEROL, DIRECT: Direct LDL: 88 mg/dL

## 2022-10-20 MED ORDER — ATORVASTATIN CALCIUM 20 MG PO TABS: 20.0000 mg | ORAL_TABLET | Freq: Every day | ORAL | 1 refills | Status: DC

## 2022-10-20 MED ORDER — SILDENAFIL CITRATE 100 MG PO TABS: 50.0000 mg | ORAL_TABLET | Freq: Every day | ORAL | 11 refills | Status: AC | PRN

## 2022-10-20 NOTE — Patient Instructions (Addendum)
You can try lower dose of sertraline (1 and 1/2 pill) if that is still effective for anxiety. Ok to return to higher dose if needed. Follow up in next 6 months with new primary provider.  Lowest dose of viagra as effective. Follow up if not improving.  Hepatitis B and MMR vaccines are options if you would like to have them given.  No med changes today. Take care.   Erectile Dysfunction Erectile dysfunction (ED) is the inability to get or keep an erection in order to have sexual intercourse. ED is considered a symptom of an underlying disorder and is not considered a disease. ED may include: Inability to get an erection. Lack of enough hardness of the erection to allow penetration. Loss of erection before sex is finished. What are the causes? This condition may be caused by: Physical causes, such as: Artery problems. This may include heart disease, high blood pressure, atherosclerosis, and diabetes. Hormonal problems, such as low testosterone. Obesity. Nerve problems. This may include back or pelvic injuries, multiple sclerosis, Parkinson's disease, spinal cord injury, and stroke. Certain medicines, such as: Pain relievers. Antidepressants. Blood pressure medicines and water pills (diuretics). Cancer medicines. Antihistamines. Muscle relaxants. Lifestyle factors, such as: Use of drugs such as marijuana, cocaine, or opioids. Excessive use of alcohol. Smoking. Lack of physical activity or exercise. Psychological causes, such as: Anxiety or stress. Sadness or depression. Exhaustion. Fear about sexual performance. Guilt. What are the signs or symptoms? Symptoms of this condition include: Inability to get an erection. Lack of enough hardness of the erection to allow penetration. Loss of the erection before sex is finished. Sometimes having normal erections, but with frequent unsatisfactory episodes. Low sexual satisfaction in either partner due to erection problems. A curved penis  occurring with erection. The curve may cause pain, or the penis may be too curved to allow for intercourse. Never having nighttime or morning erections. How is this diagnosed? This condition is often diagnosed by: Performing a physical exam to find other diseases or specific problems with the penis. Asking you detailed questions about the problem. Doing tests, such as: Blood tests to check for diabetes mellitus or high cholesterol, or to measure hormone levels. Other tests to check for underlying health conditions. An ultrasound exam to check for scarring. A test to check blood flow to the penis. Doing a sleep study at home to measure nighttime erections. How is this treated? This condition may be treated by: Medicines, such as: Medicine taken by mouth to help you achieve an erection (oral medicine). Hormone replacement therapy to replace low testosterone levels. Medicine that is injected into the penis. Your health care provider may instruct you how to give yourself these injections at home. Medicine that is delivered with a short applicator tube. The tube is inserted into the opening at the tip of the penis, which is the opening of the urethra. A tiny pellet of medicine is put in the urethra. The pellet dissolves and enhances erectile function. This is also called MUSE (medicated urethral system for erections) therapy. Vacuum pump. This is a pump with a ring on it. The pump and ring are placed on the penis and used to create pressure that helps the penis become erect. Penile implant surgery. In this procedure, you may receive: An inflatable implant. This consists of cylinders, a pump, and a reservoir. The cylinders can be inflated with a fluid that helps to create an erection, and they can be deflated after intercourse. A semi-rigid implant. This consists  of two silicone rubber rods. The rods provide some rigidity. They are also flexible, so the penis can both curve downward in its normal  position and become straight for sexual intercourse. Blood vessel surgery to improve blood flow to the penis. During this procedure, a blood vessel from a different part of the body is placed into the penis to allow blood to flow around (bypass) damaged or blocked blood vessels. Lifestyle changes, such as exercising more, losing weight, and quitting smoking. Follow these instructions at home: Medicines  Take over-the-counter and prescription medicines only as told by your health care provider. Do not increase the dosage without first discussing it with your health care provider. If you are using self-injections, do injections as directed by your health care provider. Make sure you avoid any veins that are on the surface of the penis. After giving an injection, apply pressure to the injection site for 5 minutes. Talk to your health care provider about how to prevent headaches while taking ED medicines. These medicines may cause a sudden headache due to the increase in blood flow in your body. General instructions Exercise regularly, as directed by your health care provider. Work with your health care provider to lose weight, if needed. Do not use any products that contain nicotine or tobacco. These products include cigarettes, chewing tobacco, and vaping devices, such as e-cigarettes. If you need help quitting, ask your health care provider. Before using a vacuum pump, read the instructions that come with the pump and discuss any questions with your health care provider. Keep all follow-up visits. This is important. Contact a health care provider if: You feel nauseous. You are vomiting. You get sudden headaches while taking ED medicines. You have any concerns about your sexual health. Get help right away if: You are taking oral or injectable medicines and you have an erection that lasts longer than 4 hours. If your health care provider is unavailable, go to the nearest emergency room for  evaluation. An erection that lasts much longer than 4 hours can result in permanent damage to your penis. You have severe pain in your groin or abdomen. You develop redness or severe swelling of your penis. You have redness spreading at your groin or lower abdomen. You are unable to urinate. You experience chest pain or a rapid heartbeat (palpitations) after taking oral medicines. These symptoms may represent a serious problem that is an emergency. Do not wait to see if the symptoms will go away. Get medical help right away. Call your local emergency services (911 in the [place] Do not drive yourself to the hospital. Summary Erectile dysfunction (ED) is the inability to get or keep an erection during sexual intercourse. This condition is diagnosed based on a physical exam, your symptoms, and tests to determine the cause. Treatment varies depending on the cause and may include medicines, hormone therapy, surgery, or a vacuum pump. You may need follow-up visits to make sure that you are using your medicines or devices correctly. Get help right away if you are taking or injecting medicines and you have an erection that lasts longer than 4 hours. This information is not intended to replace advice given to you by your health care provider. Make sure you discuss any questions you have with your health care provider. Document Revised: 01/13/2021 Document Reviewed: 01/13/2021 [employer] Patient Education  [employer].

## 2022-10-20 NOTE — Progress Notes (Signed)
Subjective:  Patient ID: Francisco Pena, male    DOB: [birth year]  Age: 56 y.o. MRN: [id number]  CC:  Chief Complaint  Patient presents with   forms    Pt wants a form filled out     HPI Francisco Pena presents for  Prior patient of [name], NP.  I saw him in August for adjustment disorder/anxiety and adjustment insomnia.  Prior admission in July for small bowel obstruction.   Plans to establish with Di Kindle here for new PCP.   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 No hearing aids, reading glasses only. No new sx's.  No hx of heart disease, no CP/dyspnea with exertion.  No MSK weakness or debility/loss of use.  Vaccines: Td 11/05/15 Flu vaccine 07/30/22 Covid booster - declines. Hep B vaccine and MMR recommended.   Erectile dysfunction Noted since July. Trouble obtaining and maintaining erection at times.  No treatments otc. No prior viagra. Would like to try.   Anxiety with insomnia Sertraline 150 mg daily, gabapentin 600 mg nightly for restless leg syndrome.  Trazodone as needed, half pill 2-3 times per week when discussed at his August visit, also given option for hydroxyzine if needed temporarily at that time, not to combine with trazodone and serotonin syndrome risk discussed with use of trazodone and sertraline.  Temporary increase of sertraline to 200 mg at his August visit.  CPAP for OSA.Video visit with Dutch Quint, FNP on November 28.  Stable symptoms at that time.  Currently taking 228m sertraline.  Trazodone 1/2 pill few nights per week effective,. Not needing hydroxyzine.   Hypertension: Toprol-XL 12.5 mg daily. No new side effects.  Home readings:none.  BP Readings from Last 3 Encounters:  10/20/22 130/72  05/24/22 135/80  04/13/22 120/76   Lab Results  Component Value Date   CREATININE 1.07 05/23/2022   GERD Protonix 40 mg daily, requires daily. GI -  Dr. [facility] Hx of small bowel obstruction.   Hyperlipidemia: Lipitor 20 mg daily, no new side effects., fasting today.  Lab Results  Component Value Date   CHOL 178 09/22/2021   HDL 52.30 09/22/2021   LDLCALC 94 09/22/2021   LDLDIRECT 69.0 05/29/2018   TRIG 160.0 (H) 09/22/2021   CHOLHDL 3 09/22/2021   Lab Results  Component Value Date   ALT 29 05/21/2022   AST 24 05/21/2022   ALKPHOS 62 05/21/2022   BILITOT 1.8 (H) 05/21/2022    History Patient Active Problem List   Diagnosis Date Noted   [place]21[place]   Insomnia 11/01/2021   Anxiety 01/11/2021   GERD (gastroesophageal reflux disease) 02/04/2020   Body mass index (BMI) 28.0-28.9, adult 11/15/2019   Essential (primary) hypertension 11/15/2019   Visit for preventive health examination 06/12/2018   Prostate cancer screening 06/12/2018   Obstructive sleep apnea 03/02/2017   Palpitations 03/02/2017   Restless leg syndrome 11/14/2016   Displacement of lumbar intervertebral disc without myelopathy 05/19/2016   Generalized anxiety disorder 02/24/2015   Hyperlipidemia LDL goal <130 02/24/2015   Hypertriglyceridemia 02/24/2015   Past Medical History:  Diagnosis Date   Allergy    Anxiety and depression    Atypical mole 06/11/2010   Lower Mid Back (mild to moderate)   Atypical mole 06/11/2010   Right Abdomen (mild)   Heart palpitations    Hyperlipidemia    Prostate infection    Sleep apnea  CPAP    Past Surgical History:  Procedure Laterality Date   CERVICAL SPINE SURGERY     artificial discs in neck   COLONOSCOPY  at age 58    in [place]-normal exam per pt   SPINE SURGERY Right    disc shaved on right side x2   TONSILLECTOMY     Allergies  Allergen Reactions   Augmentin [Amoxicillin-Pot Clavulanate] Diarrhea   Prior to Admission medications   Medication Sig Start Date End Date Taking? Authorizing Provider  atorvastatin (LIPITOR) 20 MG tablet Take 1 tablet (20 mg total)  by mouth daily. 03/10/22  Yes [name], NP  gabapentin (NEURONTIN) 300 MG capsule Take 2 capsules (600 mg total) by mouth at bedtime. 09/26/22  Yes Dutch Quint B, FNP  metoprolol succinate (TOPROL-XL) 25 MG 24 hr tablet Take 0.5 tablets (12.5 mg total) by mouth daily. Patient taking differently: Take 12.5 mg by mouth at bedtime. 03/10/22  Yes [name], NP  Multiple Vitamin (MULTIVITAMIN) tablet Take 1 tablet by mouth daily.   Yes [provider]  pantoprazole (PROTONIX) 40 MG tablet 1 tab(s) orally once a day for 30 day(s) 07/13/22  Yes [provider]  sertraline (ZOLOFT) 100 MG tablet TAKE 1.5 TABLETS (150MG TOTAL) BY MOUTH DAILY 09/27/22  Yes Dutch Quint B, FNP  traZODone (DESYREL) 50 MG tablet TAKE 0.5-1 TABLETS BY MOUTH AT BEDTIME AS NEEDED FOR SLEEP. 05/23/22  Yes [name], NP  valACYclovir (VALTREX) 500 MG tablet Take 1 tablet (500 mg total) by mouth daily. As needed 09/27/22  Yes Dutch Quint B, FNP  hydrOXYzine (ATARAX) 10 MG tablet Take 1 tablet (10 mg total) by mouth 3 (three) times daily as needed for anxiety (or sleep.). Patient not taking: Reported on 09/27/2022 06/10/22   Wendie Agreste, MD  pantoprazole (PROTONIX) 40 MG tablet Take 1 tablet (40 mg total) by mouth daily. Patient taking differently: Take 40 mg by mouth daily. 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   Social Determinants of Health   Financial Resource Strain: Not on file  Food Insecurity: Not on file  Transportation Needs: Not on file  Physical Activity: Sufficiently Active (01/18/2018)   Exercise Vital Sign    Days of Exercise per Week: 4 days    Minutes of Exercise per Session: 40 min  Stress: Not on file  Social Connections: Not on file  Intimate Partner Violence: Not on file    Review of Systems  Constitutional:  Negative for fatigue and unexpected weight change.  Eyes:  Negative for visual disturbance.  Respiratory:  Negative for cough, chest tightness and shortness of breath.   Cardiovascular:  Negative for  chest pain, palpitations and leg swelling.  Gastrointestinal:  Negative for abdominal pain and blood in stool.  Neurological:  Negative for dizziness, light-headedness and headaches.     Objective:   Vitals:   10/20/22 0855  BP: 130/72  Pulse: 63  Temp: 98.2 F (36.8 C)  SpO2: 97%  Weight: 230 lb 12.8 oz (104.7 kg)  Height: _0  (1.88 m)     Physical Exam Vitals reviewed.  Constitutional:      Appearance: He is well-developed.  HENT:     Head: Normocephalic and atraumatic.  Neck:     Vascular: No carotid bruit or JVD.  Cardiovascular:     Rate and Rhythm: Normal rate and regular rhythm.     Heart sounds: Normal heart sounds. No murmur heard. Pulmonary:     Effort: Pulmonary effort is normal.     Breath sounds: Normal breath sounds. No rales.  Musculoskeletal:     Right lower leg: No edema.     Left lower leg: No edema.  Skin:    General: Skin is warm and dry.  Neurological:     Mental Status: He is alert and oriented to person, place, and time.  Psychiatric:        Mood and Affect: Mood normal.        Assessment & Plan:  Francisco Pena is a 56 y.o. male . Anxiety  -Overall stable, option of 150 mg dose of sertraline or can remain at 200 mg.   Sleep stable with trazodone.  Off hydroxyzine at this time.  Gabapentin recently refilled.  Stable RLS.  Hyperlipidemia LDL goal <130 - Plan: Comprehensive metabolic panel, Lipid panel, atorvastatin (LIPITOR) 20 MG tablet  -Tolerating current med regimen, check updated labs, continue Lipitor same dose  Essential (primary) hypertension Tolerating current med regimen, recent visit with meds refilled, check labs as above.  Erectile dysfunction, unspecified erectile dysfunction type - Plan: sildenafil (VIAGRA) 100 MG tablet New concern, question psychologic component as noted since his hospitalization earlier this year.  Less likely SSRI  but is on higher dosing. Short-term trial of Viagra given with RTC precautions. viagra Rx given - use lowest effective dose. Side effects discussed (including but not limited to headache/flushing, blue discoloration of vision, possible vascular steal and risk of cardiac effects if underlying unknown coronary artery disease, and permanent sensorineural hearing loss). Understanding expressed.  Form completed for work, school physical form.  No restrictions.  Meds ordered this encounter  Medications   sildenafil (VIAGRA) 100 MG tablet    Sig: Take 0.5-1 tablets (50-100 mg total) by mouth daily as needed for erectile dysfunction (30 minutes prior to intercourse.).    Dispense:  5 tablet    Refill:  11   atorvastatin (LIPITOR) 20 MG tablet    Sig: Take 1 tablet (20 mg total) by mouth daily.    Dispense:  90 tablet    Refill:  1   Patient Instructions  You can try lower dose of sertraline (1 and 1/2 pill) if that is still effective for anxiety. Ok to return to higher dose if needed. Follow up in next 6 months with new primary provider.  Lowest dose of viagra as effective. Follow up if not improving.  Hepatitis B and MMR vaccines are options if you would like to have them given.  No med changes today. Take care.   Erectile Dysfunction Erectile dysfunction (ED)  is the inability to get or keep an erection in order to have sexual intercourse.  ED is considered a symptom of an underlying disorder and is not considered a disease. ED may include: Inability to get an erection. Lack of enough hardness of the erection to allow penetration. Loss of erection before sex is finished. What are the causes? This condition may be caused by: Physical causes, such as: Artery problems. This may include heart disease, high blood pressure, atherosclerosis, and diabetes. Hormonal problems, such as low testosterone. Obesity. Nerve problems. This may include back or pelvic injuries, multiple sclerosis, Parkinson's disease, spinal cord injury, and stroke. Certain medicines, such as: Pain relievers. Antidepressants. Blood pressure medicines and water pills (diuretics). Cancer medicines. Antihistamines. Muscle relaxants. Lifestyle factors, such as: Use of drugs such as marijuana, cocaine, or opioids. Excessive use of alcohol. Smoking. Lack of physical activity or exercise. Psychological causes, such as: Anxiety or stress. Sadness or depression. Exhaustion. Fear about sexual performance. Guilt. What are the signs or symptoms? Symptoms of this condition include: Inability to get an erection. Lack of enough hardness of the erection to allow penetration. Loss of the erection before sex is finished. Sometimes having normal erections, but with frequent unsatisfactory episodes. Low sexual satisfaction in either partner due to erection problems. A curved penis occurring with erection. The curve may cause pain, or the penis may be too curved to allow for intercourse. Never having nighttime or morning erections. How is this diagnosed? This condition is often diagnosed by: Performing a physical exam to find other diseases or specific problems with the penis. Asking you detailed questions about the problem. Doing tests, such as: Blood tests to check for diabetes  mellitus or high cholesterol, or to measure hormone levels. Other tests to check for underlying health conditions. An ultrasound exam to check for scarring. A test to check blood flow to the penis. Doing a sleep study at home to measure nighttime erections. How is this treated? This condition may be treated by: Medicines, such as: Medicine taken by mouth to help you achieve an erection (oral medicine). Hormone replacement therapy to replace low testosterone levels. Medicine that is injected into the penis. Your health care provider may instruct you how to give yourself these injections at home. Medicine that is delivered with a short applicator tube. The tube is inserted into the opening at the tip of the penis, which is the opening of the urethra. A tiny pellet of medicine is put in the urethra. The pellet dissolves and enhances erectile function. This is also called MUSE (medicated urethral system for erections) therapy. Vacuum pump. This is a pump with a ring on it. The pump and ring are placed on the penis and used to create pressure that helps the penis become erect. Penile implant surgery. In this procedure, you may receive: An inflatable implant. This consists of cylinders, a pump, and a reservoir. The cylinders can be inflated with a fluid that helps to create an erection, and they can be deflated after intercourse. A semi-rigid implant. This consists of two silicone rubber rods. The rods provide some rigidity. They are also flexible, so the penis can both curve downward in its normal position and become straight for sexual intercourse. Blood vessel surgery to improve blood flow to the penis. During this procedure, a blood vessel from a different part of the body is placed into the penis to allow blood to flow around (bypass) damaged or blocked blood vessels. Lifestyle changes, such as exercising more, losing weight, and quitting smoking. Follow these instructions at  home: Medicines  Take over-the-counter and  prescription medicines only as told by your health care provider. Do not increase the dosage without first discussing it with your health care provider. If you are using self-injections, do injections as directed by your health care provider. Make sure you avoid any veins that are on the surface of the penis. After giving an injection, apply pressure to the injection site for 5 minutes. Talk to your health care provider about how to prevent headaches while taking ED medicines. These medicines may cause a sudden headache due to the increase in blood flow in your body. General instructions Exercise regularly, as directed by your health care provider. Work with your health care provider to lose weight, if needed. Do not use any products that contain nicotine or tobacco. These products include cigarettes, chewing tobacco, and vaping devices, such as e-cigarettes. If you need help quitting, ask your health care provider. Before using a vacuum pump, read the instructions that come with the pump and discuss any questions with your health care provider. Keep all follow-up visits. This is important. Contact a health care provider if: You feel nauseous. You are vomiting. You get sudden headaches while taking ED medicines. You have any concerns about your sexual health. Get help right away if: You are taking oral or injectable medicines and you have an erection that lasts longer than 4 hours. If your health care provider is unavailable, go to the nearest emergency room for evaluation. An erection that lasts much longer than 4 hours can result in permanent damage to your penis. You have severe pain in your groin or abdomen. You develop redness or severe swelling of your penis. You have redness spreading at your groin or lower abdomen. You are unable to urinate. You experience chest pain or a rapid heartbeat (palpitations) after taking oral medicines. These  symptoms may represent a serious problem that is an emergency. Do not wait to see if the symptoms will go away. Get medical help right away. Call your local emergency services (911 in the [place] Do not drive yourself to the hospital. Summary Erectile dysfunction (ED) is the inability to get or keep an erection during sexual intercourse. This condition is diagnosed based on a physical exam, your symptoms, and tests to determine the cause. Treatment varies depending on the cause and may include medicines, hormone therapy, surgery, or a vacuum pump. You may need follow-up visits to make sure that you are using your medicines or devices correctly. Get help right away if you are taking or injecting medicines and you have an erection that lasts longer than 4 hours. This information is not intended to replace advice given to you by your health care provider. Make sure you discuss any questions you have with your health care provider. Document Revised: 01/13/2021 Document Reviewed: 01/13/2021 Elsevier Patient Education  [place],   Merri Ray, MD [facility] 10/20/22 9:52 AM

## 2022-10-25 ENCOUNTER — Encounter: Payer: Self-pay | Admitting: Family Medicine

## 2023-03-14 ENCOUNTER — Encounter: Payer: BC Managed Care – PPO | Admitting: Registered Nurse

## 2023-03-24 ENCOUNTER — Other Ambulatory Visit: Payer: Self-pay | Admitting: Family

## 2023-03-24 DIAGNOSIS — F419 Anxiety disorder, unspecified: Secondary | ICD-10-CM

## 2023-03-24 DIAGNOSIS — G2581 Restless legs syndrome: Secondary | ICD-10-CM

## 2023-03-31 ENCOUNTER — Ambulatory Visit: Payer: 59 | Admitting: Family Medicine

## 2023-03-31 ENCOUNTER — Encounter: Payer: Self-pay | Admitting: Family Medicine

## 2023-03-31 VITALS — BP 130/76 | HR 76 | Temp 98.7°F | Ht 74.0 in | Wt 229.8 lb

## 2023-03-31 DIAGNOSIS — E785 Hyperlipidemia, unspecified: Secondary | ICD-10-CM

## 2023-03-31 DIAGNOSIS — F419 Anxiety disorder, unspecified: Secondary | ICD-10-CM

## 2023-03-31 DIAGNOSIS — F411 Generalized anxiety disorder: Secondary | ICD-10-CM

## 2023-03-31 DIAGNOSIS — R002 Palpitations: Secondary | ICD-10-CM

## 2023-03-31 DIAGNOSIS — Z1159 Encounter for screening for other viral diseases: Secondary | ICD-10-CM

## 2023-03-31 DIAGNOSIS — N529 Male erectile dysfunction, unspecified: Secondary | ICD-10-CM

## 2023-03-31 DIAGNOSIS — G2581 Restless legs syndrome: Secondary | ICD-10-CM

## 2023-03-31 DIAGNOSIS — K219 Gastro-esophageal reflux disease without esophagitis: Secondary | ICD-10-CM | POA: Diagnosis not present

## 2023-03-31 DIAGNOSIS — Z7689 Persons encountering health services in other specified circumstances: Secondary | ICD-10-CM

## 2023-03-31 DIAGNOSIS — F5101 Primary insomnia: Secondary | ICD-10-CM | POA: Diagnosis not present

## 2023-03-31 DIAGNOSIS — I10 Essential (primary) hypertension: Secondary | ICD-10-CM

## 2023-03-31 MED ORDER — SERTRALINE HCL 100 MG PO TABS: ORAL_TABLET | ORAL | 1 refills | Status: DC

## 2023-03-31 MED ORDER — GABAPENTIN 300 MG PO CAPS: 600.0000 mg | ORAL_CAPSULE | Freq: Every day | ORAL | 1 refills | Status: DC

## 2023-03-31 MED ORDER — METOPROLOL SUCCINATE ER 25 MG PO TB24: 12.5000 mg | ORAL_TABLET | Freq: Every evening | ORAL | 1 refills | Status: DC

## 2023-03-31 NOTE — Assessment & Plan Note (Signed)
Stable. Continue Metoprolol Succinate 12.5mg  at night time. Refilled medications.

## 2023-03-31 NOTE — Progress Notes (Signed)
New Patient Office Visit  Subjective    Patient ID: Francisco Pena, male    DOB: [birth year]  Age: 57 y.o. MRN: [id number]  CC:  Chief Complaint  Patient presents with   Establish Care    Pt doing well, no concerns     HPI Francisco Pena presents to establish care with new provider.   Patients previous primary care provider was Janeece Agee, NP. Last visit was 03/10/2022. Patient did see Dr. Herma Carson within the same practice 10/20/2022.   Specialist:  [facility] at [facility] with Dr. Jetty Duhamel  Cardiology-[facility] Office-Dr. Dietrich Pates  Dermatology-Dr. Nita Sells [facility].   GERD: Chronic. Patient is taking Protonix 40mg  daily. Effective.   Palpitations:Chronic. Patient is taking Metoprolol Succinate 12.5mg  tablet daily. Effective.    Hyperlipidemia: Chronic. Patient is taking Atorvastatin 20mg  daily. No concerns. No muscle aches.  Lab Results  Component Value Date   CHOL 175 10/20/2022   HDL 55.90 10/20/2022   LDLCALC 94 09/22/2021   LDLDIRECT 88.0 10/20/2022   TRIG 269.0 (H) 10/20/2022   CHOLHDL 3 10/20/2022    Erectile Dysfunction: Chronic. Sildenafil 50-100mg  daily as needed for erectile dysfunction.    Anxiety with Insomnia: Chronic. Patient is taking Sertraline 150mg  daily, Gabapentin 600mg  nightly for restless leg syndrome, and Trazodone 25 mg almost every night at bedtime for insomnia. Effective. No concerns.   Outpatient Encounter Medications as of 03/31/2023  Medication Sig   atorvastatin (LIPITOR) 20 MG tablet Take 1 tablet (20 mg total) by mouth daily.   Multiple Vitamin (MULTIVITAMIN) tablet Take 1 tablet by mouth daily.   sildenafil (VIAGRA) 100 MG tablet Take 0.5-1 tablets (50-100 mg total) by mouth daily as needed for erectile dysfunction (30 minutes prior to intercourse.).   traZODone (DESYREL) 50 MG tablet TAKE 0.5-1 TABLETS BY MOUTH AT BEDTIME AS NEEDED FOR SLEEP.   valACYclovir (VALTREX) 500 MG tablet  Take 1 tablet (500 mg total) by mouth daily. As needed   [DISCONTINUED] gabapentin (NEURONTIN) 300 MG capsule Take 2 capsules (600 mg total) by mouth at bedtime.   [DISCONTINUED] metoprolol succinate (TOPROL-XL) 25 MG 24 hr tablet Take 0.5 tablets (12.5 mg total) by mouth daily. (Patient taking differently: Take 12.5 mg by mouth at bedtime.)   [DISCONTINUED] pantoprazole (PROTONIX) 40 MG tablet 1 tab(s) orally once a day for 30 day(s)   [DISCONTINUED] sertraline (ZOLOFT) 100 MG tablet TAKE 1.5 TABLETS (150MG  TOTAL) BY MOUTH DAILY   gabapentin (NEURONTIN) 300 MG capsule Take 2 capsules (600 mg total) by mouth at bedtime.   metoprolol succinate (TOPROL-XL) 25 MG 24 hr tablet Take 0.5 tablets (12.5 mg total) by mouth at bedtime.   pantoprazole (PROTONIX) 40 MG tablet Take 1 tablet (40 mg total) by mouth daily. (Patient taking differently: Take 40 mg by mouth daily. Pt is taking)   sertraline (ZOLOFT) 100 MG tablet TAKE 1.5 TABLETS (150MG  TOTAL) BY MOUTH DAILY   No facility-administered encounter medications on file as of 03/31/2023.    Past Medical History:  Diagnosis Date   Allergy    Anxiety and depression    Atypical mole 06/11/2010   Lower Mid Back (mild to moderate)   Atypical mole 06/11/2010   Right Abdomen (mild)   GERD (gastroesophageal reflux disease)    Heart palpitations    Hyperlipidemia    Prostate infection    Sleep apnea    CPAP     Past Surgical History:  Procedure Laterality Date  CERVICAL SPINE SURGERY     artificial discs in neck   COLONOSCOPY  at age 84    [place]normal exam per pt   SHOULDER SURGERY Left    SPINE SURGERY Right    disc shaved on right side x2   TONSILLECTOMY      Family History  Problem Relation Age of Onset   Hypertension Mother    Breast cancer Mother    Dementia Mother    Hypertension Father    Heart disease Father        CHF    Suicidality Father        Passed away from   Hypertension Sister    Colon polyps Brother     Dementia Maternal Grandmother    Colon cancer Neg Hx    Esophageal cancer Neg Hx    Rectal cancer Neg Hx    Stomach cancer Neg Hx     Social History   Socioeconomic History   Marital status: Married    Spouse name: Not on file   Number of children: 4   Years of education: Not on file   Highest education level: Master's degree (e.g., MA, MS, MEng, MEd, MSW, MBA)  Occupational History   Occupation: Runner, broadcasting/film/video   Occupation: Runner, broadcasting/film/video    Comment: [employer]  Tobacco Use   Smoking status: Never   Smokeless tobacco: Never  Vaping Use   Vaping Use: Never used  Substance and Sexual Activity   Alcohol use: Yes    Comment: 1 every 6-7 months   Drug use: No   Sexual activity: Yes    Partners: Female  Other Topics Concern   Not on file  Social History Narrative   Remarried 2014 - children from both prior marriages spend most of their time at their house.   Social Determinants of Health   Financial Resource Strain: Not on file  Food Insecurity: No Food Insecurity (03/31/2023)   Hunger Vital Sign    Worried About Running Out of Food in the Last Year: Never true    Ran Out of Food in the Last Year: Never true  Transportation Needs: No Transportation Needs (03/31/2023)   PRAPARE - Administrator, Civil Service (Medical): No    Lack of Transportation (Non-Medical): No  Physical Activity: Sufficiently Active (03/31/2023)   Exercise Vital Sign    Days of Exercise per Week: 6 days    Minutes of Exercise per Session: 90 min  Stress: Stress Concern Present (03/31/2023)   [employer] of Occupational Health - Occupational Stress Questionnaire    Feeling of Stress : Very much  Social Connections: Socially Integrated (03/31/2023)   Social Connection and Isolation Panel [NHANES]    Frequency of Communication with Friends and Family: More than three times a week    Frequency of Social Gatherings with Friends and Family: More than three times a week    Attends Religious  Services: More than 4 times per year    Active Member of [employer] or Organizations: No    Attends Engineer, structural: More than 4 times per year    Marital Status: Married  Catering manager Violence: Not At Risk (03/31/2023)   Humiliation, Afraid, Rape, and Kick questionnaire    Fear of Current or Ex-Partner: No    Emotionally Abused: No    Physically Abused: No    Sexually Abused: No    ROS See HPI above    Objective   BP 130/76  Pulse 76   Temp 98.7 F (37.1 C) (Temporal)   Ht 6\' 2"  (1.88 m)   Wt 229 lb 12.8 oz (104.2 kg)   SpO2 96%   BMI 29.50 kg/m   Physical Exam Vitals reviewed.  Constitutional:      General: He is not in acute distress.    Appearance: Normal appearance. He is not ill-appearing, toxic-appearing or diaphoretic.  HENT:     Head: Normocephalic and atraumatic.  Eyes:     General:        Right eye: No discharge.        Left eye: No discharge.     Conjunctiva/sclera: Conjunctivae normal.  Cardiovascular:     Rate and Rhythm: Normal rate and regular rhythm.     Heart sounds: Normal heart sounds. No murmur heard.    No friction rub. No gallop.  Pulmonary:     Effort: Pulmonary effort is normal. No respiratory distress.     Breath sounds: Normal breath sounds.  Musculoskeletal:        General: Normal range of motion.  Skin:    General: Skin is warm and dry.  Neurological:     General: No focal deficit present.     Mental Status: He is alert and oriented to person, place, and time. Mental status is at baseline.  Psychiatric:        Mood and Affect: Mood normal.        Behavior: Behavior normal.        Thought Content: Thought content normal.        Judgment: Judgment normal.      Assessment & Plan:  Hyperlipidemia LDL goal <130 Assessment & Plan: Stable. Continue Atorvastatin 20mg  daily. Ordered lipids and CMP. Patient will come back for a fasting lab visit.    Orders: -     Lipid panel; Future -     Comprehensive metabolic  panel; Future  Gastroesophageal reflux disease, unspecified whether esophagitis present Assessment & Plan: Stable. Continue Pantoprazole 40mg  daily.    Primary insomnia Assessment & Plan: Stable. Continue with Trazodone 25mg  most nights    Erectile dysfunction, unspecified erectile dysfunction type  Restless leg syndrome Assessment & Plan: Stable. Continue with Gabapentin 600mg  night.   Orders: -     Gabapentin; Take 2 capsules (600 mg total) by mouth at bedtime.  Dispense: 180 capsule; Refill: 1  Need for hepatitis C screening test -     Hepatitis C antibody; Future  Palpitations Assessment & Plan: Stable. Continue Metoprolol Succinate 12.5mg  at night time. Refilled medications.   Orders: -     Metoprolol Succinate ER; Take 0.5 tablets (12.5 mg total) by mouth at bedtime.  Dispense: 90 tablet; Refill: 1  Generalized anxiety disorder Assessment & Plan: Stable. Continue. Refilled Sertraline 150mg  daily.   Orders: -     Sertraline HCl; TAKE 1.5 TABLETS (150MG  TOTAL) BY MOUTH DAILY  Dispense: 135 tablet; Refill: 1  Encounter to establish care  1.Review health maintenance:  -Ordered Hep C screening. -[facility] [address], received  -Declines covid booster  2.Continue Sildenafil as needed.  Return in about 6 months (around 09/30/2023) for chronic management: lab visit .   Zandra Abts, NP

## 2023-03-31 NOTE — Assessment & Plan Note (Signed)
Stable. Continue with Trazodone 25mg  most nights

## 2023-03-31 NOTE — Patient Instructions (Signed)
-  It was a pleasure to meet you and look forward to taking care of you. -Ordered labs for screening. Make an appointment when fasting. -Refilled medications.  -Follow up in 6 months for chronic management.

## 2023-03-31 NOTE — Assessment & Plan Note (Signed)
Stable. Continue. Refilled Sertraline 150mg  daily.

## 2023-03-31 NOTE — Assessment & Plan Note (Signed)
Stable. Continue with Gabapentin 600mg  night.

## 2023-03-31 NOTE — Assessment & Plan Note (Addendum)
Stable. Continue Atorvastatin 20mg  daily. Ordered lipids and CMP. Patient will come back for a fasting lab visit.

## 2023-03-31 NOTE — Assessment & Plan Note (Signed)
Stable.  Continue  Pantoprazole 40mg daily

## 2023-04-03 ENCOUNTER — Encounter: Payer: Self-pay | Admitting: Family Medicine

## 2023-04-03 ENCOUNTER — Other Ambulatory Visit (INDEPENDENT_AMBULATORY_CARE_PROVIDER_SITE_OTHER): Payer: 59

## 2023-04-03 ENCOUNTER — Telehealth: Payer: Self-pay

## 2023-04-03 DIAGNOSIS — E785 Hyperlipidemia, unspecified: Secondary | ICD-10-CM | POA: Diagnosis not present

## 2023-04-03 DIAGNOSIS — Z1159 Encounter for screening for other viral diseases: Secondary | ICD-10-CM

## 2023-04-03 LAB — LIPID PANEL
Cholesterol: 156 mg/dL (ref 0–200)
HDL: 48.7 mg/dL (ref >39.00)
LDL Cholesterol: 70 mg/dL (ref 0–99)
NonHDL: 107.59
Total CHOL/HDL Ratio: 3
Triglycerides: 187 mg/dL — ABNORMAL HIGH (ref 0.0–149.0)
VLDL: 37.4 mg/dL (ref 0.0–40.0)

## 2023-04-03 LAB — COMPREHENSIVE METABOLIC PANEL
ALT: 26 U/L (ref 0–53)
AST: 38 U/L — ABNORMAL HIGH (ref 0–37)
Albumin: 4.4 g/dL (ref 3.5–5.2)
Alkaline Phosphatase: 64 U/L (ref 39–117)
BUN: 17 mg/dL (ref 6–23)
CO2: 31 mEq/L (ref 19–32)
Calcium: 9.5 mg/dL (ref 8.4–10.5)
Chloride: 104 mEq/L (ref 96–112)
Creatinine, Ser: 1.36 mg/dL (ref 0.40–1.50)
GFR: 58.86 mL/min — ABNORMAL LOW (ref >60.00)
Glucose, Bld: 94 mg/dL (ref 70–99)
Potassium: 4.6 mEq/L (ref 3.5–5.1)
Sodium: 144 mEq/L (ref 135–145)
Total Bilirubin: 1.4 mg/dL — ABNORMAL HIGH (ref 0.2–1.2)
Total Protein: 6.8 g/dL (ref 6.0–8.3)

## 2023-04-03 NOTE — Telephone Encounter (Signed)
-----   Message from Alveria Apley, NP sent at 04/03/2023  2:15 PM EDT ----- Lipids look good except Triglycerides are elevated. Recommend to decrease carbohydrates and sweets.  GFR, kidney filtration rate is slightly low, recommend to promote hydration with water. All other labs are stable

## 2023-04-04 ENCOUNTER — Telehealth: Payer: Self-pay

## 2023-04-04 LAB — HEPATITIS C ANTIBODY: Hepatitis C Ab: NONREACTIVE

## 2023-04-04 NOTE — Telephone Encounter (Signed)
-----   Message from Alveria Apley, NP sent at 04/04/2023  2:52 PM EDT ----- Hep C is non reactive.

## 2023-04-04 NOTE — Telephone Encounter (Signed)
Left results on pt VM  

## 2023-04-12 NOTE — Progress Notes (Signed)
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 Medical problem list  HTN, GERD, Restless Legs, Hyperlipidemia, Anxiety,  HST 03/29/17- AHI 7.5/ hr, desaturation to 81%, body weight 219 lbs CPAP auto 4-12/ [facility]   Replacement machine ordered 03/19/21 Download- compliance 50%, AHI 4.2/ hr Body weight today-226 lbs Covid vax- He reports taking CPAP off in his sleep.  Does sleep better if he can keep it on.  Taking one half of an alprazolam tablet for sleep with benefit.  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 We discussed alternatives to CPAP for his mild OSA.  Primary complaint has been snoring.  Wife keeps telling him to put his mask back on.  We decided we would refer to consider oral appliance and also to explore Inspire. He also mentions he would like a refill for rescue inhaler.  He has been using ProAir occasionally for episodes of mild dyspnea with gasping.  It sounds as if he may be describing mild intermittent asthma.  We discussed the role of rescue inhalers.  He is not needing this as much is once a week.  Seasonally worse.  ROS-see HPI   + = pos Constitutional:    weight loss, night sweats, fevers, chills, + fatigue, lassitude. HEENT:    headaches, difficulty swallowing, tooth/dental problems, sore throat,       sneezing, itching, ear ache, + nasal congestion, post nasal drip, snoring CV:    chest pain,  orthopnea, PND, swelling in lower extremities, anasarca,                                                   dizziness, palpitations Resp:   shortness of breath with exertion or at rest.                productive cough,   non-productive cough, coughing up of blood.              change in color of mucus.  wheezing.   Skin:    rash or lesions. GI:  No-   heartburn, indigestion, abdominal pain, nausea, vomiting, diarrhea,                 change in bowel habits, loss of appetite GU: dysuria, change in color of urine, no urgency or frequency.   flank pain. MS:   + joint pain, stiffness, decreased range of motion, back pain. Neuro-     nothing unusual Psych:  change in mood or affect.  depression or anxiety.   memory loss.  OBJ- Physical Exam General- Alert, Oriented, Affect-appropriate, Distress- none acute, not obese Skin- rash-none, lesions- none, excoriation- none Lymphadenopathy- none Head- atraumatic            Eyes- Gross vision intact, PERRLA, conjunctivae and secretions clear            Ears-  Hearing, canals-normal            Nose- Clear, no-Septal dev, mucus, polyps, erosion, perforation             Throat- Mallampati II , mucosa clear , drainage- none, tonsils- atrophic Neck- flexible , trachea midline, no stridor , thyroid nl, carotid no bruit Chest - symmetrical excursion , unlabored           Heart/CV- RRR at this exam , no murmur , no gallop  , no rub, nl s1 s2                           - JVD- none , edema- none, stasis changes- none, varices- none           Lung- clear to P&A, wheeze- none, cough- none , dullness-none, rub- none           Chest wall-  Abd-  Br/ Gen/ Rectal- Not done, not indicated Extrem- cyanosis- none, clubbing, none, atrophy- none, strength- nl Neuro- grossly intact to observation

## 2023-04-14 ENCOUNTER — Encounter: Payer: Self-pay | Admitting: Internal Medicine

## 2023-04-14 ENCOUNTER — Ambulatory Visit (INDEPENDENT_AMBULATORY_CARE_PROVIDER_SITE_OTHER): Payer: 59 | Admitting: Internal Medicine

## 2023-04-14 VITALS — BP 118/76 | HR 60 | Ht 74.0 in | Wt 227.6 lb

## 2023-04-14 DIAGNOSIS — G4733 Obstructive sleep apnea (adult) (pediatric): Secondary | ICD-10-CM

## 2023-04-14 DIAGNOSIS — J4520 Mild intermittent asthma, uncomplicated: Secondary | ICD-10-CM | POA: Insufficient documentation

## 2023-04-14 MED ORDER — ALBUTEROL SULFATE HFA 108 (90 BASE) MCG/ACT IN AERS: 2.0000 | INHALATION_SPRAY | Freq: Four times a day (QID) | RESPIRATORY_TRACT | 12 refills | Status: DC | PRN

## 2023-04-14 NOTE — Assessment & Plan Note (Addendum)
Albuterol inhaler with discussion. Watch need for further evaluation including PFT

## 2023-04-14 NOTE — Patient Instructions (Signed)
Order- referral to Dr Irene Limbo, DDS    consider oral appliance for OSA  Order- referral to Dr Christia Reading, ENT  consider Inspire for OSA  Script sent for albuterol rescue inhaler for mild intermittent asthma   inhale 2 puffs every 6 hours, as needed, for wheeze, chest tightness, shortness of breath

## 2023-04-14 NOTE — Assessment & Plan Note (Signed)
He does not like CPAP and struggles to be compliant.  We discussed alternatives.  For his very mild sleep apnea, 1 option is no treatment, emphasizing conservative measures such as keeping weight down and sleeping off flat of back. Plan-referral to consider oral appliance.  Referral to consider Inspire.

## 2023-04-28 ENCOUNTER — Other Ambulatory Visit: Payer: Self-pay

## 2023-04-28 ENCOUNTER — Other Ambulatory Visit: Payer: Self-pay | Admitting: Family Medicine

## 2023-04-28 DIAGNOSIS — E785 Hyperlipidemia, unspecified: Secondary | ICD-10-CM

## 2023-04-28 MED ORDER — ATORVASTATIN CALCIUM 20 MG PO TABS
20.0000 mg | ORAL_TABLET | Freq: Every day | ORAL | 1 refills | Status: DC
Start: 2023-04-28 — End: 2023-06-01

## 2023-04-28 NOTE — Telephone Encounter (Signed)
Refilled atorvastatin (LIPITOR) 20 MG tablet 90 days 1 refill Last office visit 03/31/2023 Upcoming appt. 10/02/2023

## 2023-05-26 ENCOUNTER — Ambulatory Visit: Payer: 59 | Admitting: Medical

## 2023-05-26 VITALS — BP 124/68 | HR 60 | Resp 18 | Ht 74.0 in | Wt 226.8 lb

## 2023-05-26 DIAGNOSIS — H00012 Hordeolum externum right lower eyelid: Secondary | ICD-10-CM

## 2023-05-26 MED ORDER — TOBRAMYCIN 0.3 % OP SOLN: 2.0000 [drp] | Freq: Four times a day (QID) | OPHTHALMIC | 0 refills | Status: DC

## 2023-05-26 MED ORDER — CEPHALEXIN 500 MG PO CAPS: 500.0000 mg | ORAL_CAPSULE | Freq: Two times a day (BID) | ORAL | 0 refills | Status: DC

## 2023-05-26 NOTE — Patient Instructions (Addendum)
Eyelid Stye: Lower lid bump for 8-9 days, increasing in size despite warm compresses and over-the-counter stye drops. No foreign body identified on examination. -Continue warm compresses twice daily. -Start Tobramycin eye drops, prescription sent to pharmacy. -If redness spreads down the eyelid, start Keflex antibiotic twice daily for 7 days. -If no significant improvement by next week, contact provider for possible optometrist/optometrist referral.  Follow up up with pcp next week if needed in person

## 2023-05-26 NOTE — Progress Notes (Signed)
   Subjective:    Patient ID: Francisco Pena, male    DOB: 01-Aug-1966, 57 y.o.   MRN: [id number]  HPI Discussed the use of AI scribe software for clinical note transcription with the patient, who gave verbal consent to proceed.  History of Present Illness   The patient, presenting for the first time, reported the development of a lower eyelid bump approximately eight to nine days prior. He noted that the bump had progressively enlarged, with a perceived increase in size even since the previous day. Despite attempts at self-management with warm compresses and over-the-counter stye drops, the bump had not responded to treatment.  The patient also reported irritation in the upper eyelid, describing a sensation akin to having a foreign body, such as a hair, inside the eye. This was accompanied by blurry vision, which he speculated could be due to sunscreen use during outdoor walks.  In addition to the eye symptoms, the patient had a past medical history of an adverse reaction to Augmentin, which had caused diarrhea. He had previously taken Zithromax without any reported issues.              Review of Systems See hpi    Objective:   Physical Exam General- no acute Rt eye:  rt Lower eyelid with bump/stye, not responsive to warm compresses or over-the-counter stye drops. Upper eyelid irritation, sensation of foreign body. Extraocular movements intact. No foreign body on fluorescein stain application. conjuctiva clear. Pupils react to light.      Assessment & Plan:  Assessment and Plan    Eyelid Stye: Lower lid bump for 8-9 days, increasing in size despite warm compresses and over-the-counter stye drops. No foreign body identified on examination. -Continue warm compresses twice daily. -Start Tobramycin eye drops, prescription sent to pharmacy. -If redness spreads down the eyelid, start Keflex antibiotic twice daily for 7 days. -If no significant improvement by next week, contact provider  for possible optometrist/optometrist referral.      Follow up up with pcp next week if needed in person  [employer], PA-C

## 2023-06-01 ENCOUNTER — Other Ambulatory Visit: Payer: Self-pay

## 2023-06-01 ENCOUNTER — Encounter: Payer: Self-pay | Admitting: Family Medicine

## 2023-06-01 ENCOUNTER — Encounter (HOSPITAL_BASED_OUTPATIENT_CLINIC_OR_DEPARTMENT_OTHER): Payer: Self-pay | Admitting: Emergency Medicine

## 2023-06-01 ENCOUNTER — Telehealth: Payer: 59 | Admitting: Family Medicine

## 2023-06-01 ENCOUNTER — Emergency Department (HOSPITAL_BASED_OUTPATIENT_CLINIC_OR_DEPARTMENT_OTHER)
Admission: EM | Admit: 2023-06-01 | Discharge: 2023-06-01 | Disposition: A | Discharge status: Home / Self Care | Payer: No Typology Code available for payment source | Attending: Emergency Medicine | Admitting: Emergency Medicine

## 2023-06-01 DIAGNOSIS — Y990 Civilian activity done for income or pay: Secondary | ICD-10-CM | POA: Diagnosis not present

## 2023-06-01 DIAGNOSIS — T63441A Toxic effect of venom of bees, accidental (unintentional), initial encounter: Secondary | ICD-10-CM | POA: Diagnosis not present

## 2023-06-01 DIAGNOSIS — W57XXXA Bitten or stung by nonvenomous insect and other nonvenomous arthropods, initial encounter: Secondary | ICD-10-CM | POA: Diagnosis not present

## 2023-06-01 DIAGNOSIS — R21 Rash and other nonspecific skin eruption: Secondary | ICD-10-CM | POA: Diagnosis present

## 2023-06-01 MED ORDER — FAMOTIDINE IN NACL 20-0.9 MG/50ML-% IV SOLN
20.0000 mg | Freq: Once | INTRAVENOUS | Status: AC
  Administered 2023-06-01: 20 mg via INTRAVENOUS
  Filled 2023-06-01: qty 50

## 2023-06-01 MED ORDER — KETOROLAC TROMETHAMINE 30 MG/ML IJ SOLN
30.0000 mg | Freq: Once | INTRAMUSCULAR | Status: AC
  Administered 2023-06-01: 30 mg via INTRAVENOUS
  Filled 2023-06-01: qty 1

## 2023-06-01 MED ORDER — DEXAMETHASONE SODIUM PHOSPHATE 10 MG/ML IJ SOLN
10.0000 mg | Freq: Once | INTRAMUSCULAR | Status: AC
  Administered 2023-06-01: 10 mg via INTRAVENOUS
  Filled 2023-06-01: qty 1

## 2023-06-01 NOTE — ED Provider Notes (Signed)
[place] EMERGENCY DEPARTMENT AT [facility] Provider Note   CSN: [id number] Arrival date & time: 06/01/23  1346     History  Chief Complaint  Patient presents with   Insect Bite    Francisco Pena is a 57 y.o. male.  He said he was at a work event today eating outside when he was stung by a wasp 7 or 8 times to his chest and left ankle.  He began experiencing some shortness of breath.  He denies any swelling and no difficulty speaking or swallowing.  Took 25 mg of Benadryl.  Symptoms occurred about an hour ago.  No other complaints.  No history of significant allergies.  The history is provided by the patient.  Allergic Reaction Presenting symptoms: difficulty breathing and rash   Presenting symptoms: no difficulty swallowing   Severity:  Moderate Prior allergic episodes:  No prior episodes Context: insect bite/sting   Relieved by:  Nothing Worsened by:  Nothing Ineffective treatments:  Antihistamines      Home Medications Prior to Admission medications   Medication Sig Start Date End Date Taking? Authorizing Provider  atorvastatin (LIPITOR) 20 MG tablet TAKE 1 TABLET BY MOUTH EVERY DAY 04/28/23   Sheliah Hatch, MD  albuterol (VENTOLIN HFA) 108 (90 Base) MCG/ACT inhaler Inhale 2 puffs into the lungs every 6 (six) hours as needed for wheezing or shortness of breath. 04/14/23   Jetty Duhamel D, MD  atorvastatin (LIPITOR) 20 MG tablet Take 1 tablet (20 mg total) by mouth daily. 04/28/23   Alveria Apley, NP  cephALEXin (KEFLEX) 500 MG capsule Take 1 capsule (500 mg total) by mouth 2 (two) times daily. 05/26/23   Saguier, Ramon Dredge, PA-C  gabapentin (NEURONTIN) 300 MG capsule Take 2 capsules (600 mg total) by mouth at bedtime. 03/31/23   Alveria Apley, NP  metoprolol succinate (TOPROL-XL) 25 MG 24 hr tablet Take 0.5 tablets (12.5 mg total) by mouth at bedtime. 03/31/23   Alveria Apley, NP  Multiple Vitamin (MULTIVITAMIN) tablet Take 1 tablet by mouth  daily.    [provider]  pantoprazole (PROTONIX) 40 MG tablet Take 1 tablet (40 mg total) by mouth daily. Patient taking differently: Take 40 mg by mouth daily. Pt is taking 05/24/22 06/23/22  Lanae Boast, MD  sertraline (ZOLOFT) 100 MG tablet TAKE 1.5 TABLETS (150MG  TOTAL) BY MOUTH DAILY 03/31/23   Alveria Apley, NP  sildenafil (VIAGRA) 100 MG tablet Take 0.5-1 tablets (50-100 mg total) by mouth daily as needed for erectile dysfunction (30 minutes prior to intercourse.). 10/20/22   Shade Flood, MD  tobramycin (TOBREX) 0.3 % ophthalmic solution Place 2 drops into the right eye every 6 (six) hours. 05/26/23   Saguier, Ramon Dredge, PA-C  traZODone (DESYREL) 50 MG tablet TAKE 0.5-1 TABLETS BY MOUTH AT BEDTIME AS NEEDED FOR SLEEP. 05/23/22   Janeece Agee, NP  valACYclovir (VALTREX) 500 MG tablet Take 1 tablet (500 mg total) by mouth daily. As needed 09/27/22   Eulis Foster, FNP      Allergies    Augmentin [amoxicillin-pot clavulanate]    Review of Systems   Review of Systems  Constitutional:  Negative for fever.  HENT:  Negative for trouble swallowing.   Eyes:  Negative for visual disturbance.  Respiratory:  Positive for shortness of breath.   Cardiovascular:  Negative for chest pain.  Gastrointestinal:  Negative for abdominal pain.  Skin:  Positive for rash.    Physical Exam Updated Vital Signs BP Marland Kitchen)  156/89 (BP Location: Right Arm)   Pulse 89   Temp 97.7 F (36.5 C)   Resp 19   Ht 6\' 2"  (1.88 m)   Wt 103 kg   SpO2 96%   BMI 29.15 kg/m  Physical Exam Vitals and nursing note reviewed.  Constitutional:      General: He is not in acute distress.    Appearance: Normal appearance. He is well-developed.  HENT:     Head: Normocephalic and atraumatic.  Eyes:     Conjunctiva/sclera: Conjunctivae normal.     Comments: Patient has a healing wound of his right lower eyelid from a stye that was infected and drained by ophthalmology  Cardiovascular:     Rate and  Rhythm: Normal rate and regular rhythm.     Heart sounds: No murmur heard. Pulmonary:     Effort: Pulmonary effort is normal. No respiratory distress.     Breath sounds: Normal breath sounds.  Abdominal:     Palpations: Abdomen is soft.     Tenderness: There is no abdominal tenderness.  Musculoskeletal:        General: Swelling, tenderness and signs of injury present. Normal range of motion.     Cervical back: Neck supple.     Comments: Swelling and redness around left ankle.  Skin:    General: Skin is warm and dry.     Capillary Refill: Capillary refill takes less than 2 seconds.  Neurological:     General: No focal deficit present.     Mental Status: He is alert.     Sensory: No sensory deficit.     Motor: No weakness.     ED Results / Procedures / Treatments   Labs (all labs ordered are listed, but only abnormal results are displayed) Labs Reviewed - No data to display  EKG None  Radiology No results found.  Procedures Procedures    Medications Ordered in ED Medications  famotidine (PEPCID) IVPB 20 mg premix (has no administration in time range)  dexamethasone (DECADRON) injection 10 mg (has no administration in time range)  ketorolac (TORADOL) 30 MG/ML injection 30 mg (has no administration in time range)    ED Course/ Medical Decision Making/ A&P Clinical Course as of 06/01/23 1737  Thu Jun 01, 2023  1514 Patient feels much better with medications.  He has been observed here for 90 minutes and no progression.  He is comfortable plan for discharge. [MB]    Clinical Course User Index [MB] Terrilee Files, MD                                 Medical Decision Making Risk Prescription drug management.   This patient complains of bee stings allergic reaction shortness of breath; this involves an extensive number of treatment Options and is a complaint that carries with it a high risk of complications and morbidity. The differential includes local  reaction, anaphylaxis, shock I ordered medication IV steroids and Pepcid, fluids and reviewed PMP when indicated. Previous records obtained and reviewed in epic no recent admissions Cardiac monitoring reviewed, normal sinus rhythm Social determinants considered, no significant barriers Critical Interventions: None  After the interventions stated above, I reevaluated the patient and found patient to be asymptomatic Admission and further testing considered, no indications for admission or further workup at this time.  Will have him continue Benadryl at home.  Return instructions discussed  Final Clinical Impression(s) / ED Diagnoses Final diagnoses:  Bee sting reaction, accidental or unintentional, initial encounter    Rx / DC Orders ED Discharge Orders     None         Terrilee Files, MD 06/01/23 [phone]

## 2023-06-01 NOTE — Discharge Instructions (Signed)
You were seen in the emergency department for bee sting and possible allergic reaction.  You were given medications and observed for period of time with no worsening of your symptoms.  You can continue to use ice to the affected areas and Benadryl as needed every 6 hours.  Follow-up with your regular doctor.  Return to the emergency department if any worsening or concerning symptoms

## 2023-06-01 NOTE — ED Triage Notes (Signed)
Patient reports he was stung by 7-8 wasps and endorses shob now.  Patient speaking in complete sentences in triage.

## 2023-06-01 NOTE — ED Notes (Signed)
EDP into room, at BS.  ?

## 2023-06-02 ENCOUNTER — Telehealth: Payer: Self-pay | Admitting: Family Medicine

## 2023-06-02 NOTE — Transitions of Care (Post Inpatient/ED Visit) (Signed)
   06/02/2023  Name: Francisco Pena MRN: [id number] DOB: Oct 22, 1966  Today's TOC FU Call Status: Today's TOC FU Call Status:: Unsuccessful Call (1st Attempt) Unsuccessful Call (1st Attempt) Date: 06/02/23  Attempted to reach the patient regarding the most recent Inpatient/ED visit.  Follow Up Plan: Additional outreach attempts will be made to reach the patient to complete the Transitions of Care (Post Inpatient/ED visit) call.   Signature Lenard Simmer, CMA

## 2023-08-18 ENCOUNTER — Other Ambulatory Visit: Payer: Self-pay

## 2023-08-18 DIAGNOSIS — R002 Palpitations: Secondary | ICD-10-CM

## 2023-08-18 DIAGNOSIS — F411 Generalized anxiety disorder: Secondary | ICD-10-CM

## 2023-08-18 DIAGNOSIS — G2581 Restless legs syndrome: Secondary | ICD-10-CM

## 2023-08-18 DIAGNOSIS — E785 Hyperlipidemia, unspecified: Secondary | ICD-10-CM

## 2023-08-18 MED ORDER — GABAPENTIN 300 MG PO CAPS
600.0000 mg | ORAL_CAPSULE | Freq: Every day | ORAL | 1 refills | Status: DC
Start: 2023-08-18 — End: 2023-09-08

## 2023-08-18 MED ORDER — ATORVASTATIN CALCIUM 20 MG PO TABS
20.0000 mg | ORAL_TABLET | Freq: Every day | ORAL | 1 refills | Status: DC
Start: 2023-08-18 — End: 2023-09-08

## 2023-08-18 MED ORDER — METOPROLOL SUCCINATE ER 25 MG PO TB24
12.5000 mg | ORAL_TABLET | Freq: Every evening | ORAL | 1 refills | Status: DC
Start: 2023-08-18 — End: 2023-09-08

## 2023-08-18 MED ORDER — SERTRALINE HCL 100 MG PO TABS
ORAL_TABLET | ORAL | 1 refills | Status: DC
Start: 2023-08-18 — End: 2023-09-08

## 2023-08-18 NOTE — Telephone Encounter (Signed)
Patient called in and states trying to transfer meds to new preferred medication and states we need to Call [phone] to get medications transferred to the Mail order through [place] [facility]   Pended meds below

## 2023-08-25 ENCOUNTER — Telehealth: Payer: Self-pay | Admitting: Internal Medicine

## 2023-08-25 MED ORDER — ALBUTEROL SULFATE HFA 108 (90 BASE) MCG/ACT IN AERS: 2.0000 | INHALATION_SPRAY | Freq: Four times a day (QID) | RESPIRATORY_TRACT | 4 refills | Status: AC | PRN

## 2023-08-25 NOTE — Telephone Encounter (Signed)
[facility] mail order asked refill albuterol hfa

## 2023-09-08 MED ORDER — SERTRALINE HCL 100 MG PO TABS: ORAL_TABLET | ORAL | 1 refills | Status: DC

## 2023-09-08 MED ORDER — ATORVASTATIN CALCIUM 20 MG PO TABS: 20.0000 mg | ORAL_TABLET | Freq: Every day | ORAL | 1 refills | Status: DC

## 2023-09-08 MED ORDER — GABAPENTIN 300 MG PO CAPS: 600.0000 mg | ORAL_CAPSULE | Freq: Every day | ORAL | 1 refills | Status: DC

## 2023-09-08 MED ORDER — METOPROLOL SUCCINATE ER 25 MG PO TB24: 12.5000 mg | ORAL_TABLET | Freq: Every evening | ORAL | 1 refills | Status: DC

## 2023-09-08 NOTE — Addendum Note (Signed)
Addended by: Kathreen Devoid on: 09/08/2023 08:42 AM   Modules accepted: Orders

## 2023-10-02 ENCOUNTER — Ambulatory Visit: Payer: 59 | Admitting: Family Medicine

## 2023-11-20 ENCOUNTER — Ambulatory Visit: Payer: 59 | Admitting: Family Medicine

## 2023-11-20 ENCOUNTER — Encounter: Payer: Self-pay | Admitting: Family Medicine

## 2023-11-20 VITALS — BP 130/70 | HR 68 | Temp 98.0°F | Ht 74.0 in | Wt 231.6 lb

## 2023-11-20 DIAGNOSIS — N401 Enlarged prostate with lower urinary tract symptoms: Secondary | ICD-10-CM | POA: Diagnosis not present

## 2023-11-20 DIAGNOSIS — N3943 Post-void dribbling: Secondary | ICD-10-CM | POA: Diagnosis not present

## 2023-11-20 DIAGNOSIS — R3129 Other microscopic hematuria: Secondary | ICD-10-CM

## 2023-11-20 LAB — URINALYSIS, ROUTINE W REFLEX MICROSCOPIC
Bilirubin Urine: NEGATIVE
Hgb urine dipstick: NEGATIVE
Ketones, ur: NEGATIVE
Leukocytes,Ua: NEGATIVE
Nitrite: NEGATIVE
RBC / HPF: NONE SEEN (ref 0–?)
Specific Gravity, Urine: 1.02 (ref 1.000–1.030)
Total Protein, Urine: NEGATIVE
Urine Glucose: NEGATIVE
Urobilinogen, UA: 0.2 (ref 0.0–1.0)
WBC, UA: NONE SEEN (ref 0–?)
pH: 6 (ref 5.0–8.0)

## 2023-11-20 LAB — POCT URINALYSIS DIPSTICK
Bilirubin, UA: NEGATIVE
Blood, UA: POSITIVE
Glucose, UA: NEGATIVE
Ketones, UA: NEGATIVE
Leukocytes, UA: NEGATIVE
Nitrite, UA: NEGATIVE
Protein, UA: NEGATIVE
Spec Grav, UA: 1.02 (ref 1.010–1.025)
Urobilinogen, UA: 0.2 U/dL
pH, UA: 5.5 (ref 5.0–8.0)

## 2023-11-20 MED ORDER — TAMSULOSIN HCL 0.4 MG PO CAPS
0.4000 mg | ORAL_CAPSULE | Freq: Every day | ORAL | 1 refills | Status: DC
Start: 2023-11-20 — End: 2024-01-18

## 2023-11-20 MED ORDER — TAMSULOSIN HCL 0.4 MG PO CAPS
0.4000 mg | ORAL_CAPSULE | Freq: Every day | ORAL | 0 refills | Status: DC
Start: 2023-11-20 — End: 2023-11-20

## 2023-11-20 NOTE — Progress Notes (Signed)
Established Patient Office Visit   Subjective  Patient ID: Francisco Pena, male    DOB: 08/31/1966  Age: 58 y.o. MRN: [id number]  Chief Complaint  Patient presents with   Urinary Retention    Started a month ago, once patient urinates he has "dripping" after     Pt is a 58 yo male followed by Zandra Abts, NP and seen for ongoing concern PCVs absence.  Patient endorses postvoid dribbling x 1 month.  Denies nocturia, hematuria, urinary frequency, difficulty starting stream, dysuria.  Patient notes prior history of similar episodes that resolved on their own.  Patient also recalls prostatitis but states he is not having any pain.  Unsure of last PSA.  Patient also notes history of constipation.  Taking MiraLAX as needed.  Also has history of L4-L5 disc issues causing mild weakness in right LE.    Patient Active Problem List   Diagnosis Date Noted   Asthma in adult, mild intermittent, uncomplicated 04/14/2023   SBO (small bowel obstruction) (HCC) 05/21/2022   Insomnia 11/01/2021   GERD (gastroesophageal reflux disease) 02/04/2020   Body mass index (BMI) 28.0-28.9, adult 11/15/2019   Essential (primary) hypertension 11/15/2019   Visit for preventive health examination 06/12/2018   Prostate cancer screening 06/12/2018   Obstructive sleep apnea 03/02/2017   Palpitations 03/02/2017   Restless leg syndrome 11/14/2016   Displacement of lumbar intervertebral disc without myelopathy 05/19/2016   Generalized anxiety disorder 02/24/2015   Hyperlipidemia LDL goal <130 02/24/2015   Hypertriglyceridemia 02/24/2015   Past Medical History:  Diagnosis Date   Allergy    Anxiety and depression    Atypical mole 06/11/2010   Lower Mid Back (mild to moderate)   Atypical mole 06/11/2010   Right Abdomen (mild)   GERD (gastroesophageal reflux disease)    Heart palpitations    Hyperlipidemia    Prostate infection    Sleep apnea    CPAP    Past Surgical History:  Procedure Laterality Date    CERVICAL SPINE SURGERY     artificial discs in neck   COLONOSCOPY  at age 18    in [place]-normal exam per pt   SHOULDER SURGERY Left    SPINE SURGERY Right    disc shaved on right side x2   TONSILLECTOMY     Social History   Tobacco Use   Smoking status: Never   Smokeless tobacco: Never  Vaping Use   Vaping status: Never Used  Substance Use Topics   Alcohol use: Yes    Comment: 1 every 6-7 months   Drug use: No   Family History  Problem Relation Age of Onset   Hypertension Mother    Breast cancer Mother    Dementia Mother    Hypertension Father    Heart disease Father        CHF    Suicidality Father        Passed away from   Hypertension Sister    Colon polyps Brother    Dementia Maternal Grandmother    Colon cancer Neg Hx    Esophageal cancer Neg Hx    Rectal cancer Neg Hx    Stomach cancer Neg Hx    Allergies  Allergen Reactions   Augmentin [Amoxicillin-Pot Clavulanate] Diarrhea      ROS Negative unless stated above    Objective:     BP 130/70 (BP Location: Left Arm, Patient Position: Sitting, Cuff Size: Large)   Pulse 68   Temp 98 F (36.7 C) (  Oral)   Ht 6\' 2"  (1.88 m)   Wt 231 lb 9.6 oz (105.1 kg)   SpO2 97%   BMI 29.74 kg/m  BP Readings from Last 3 Encounters:  11/20/23 130/70  06/01/23 123/87  05/26/23 124/68   Wt Readings from Last 3 Encounters:  11/20/23 231 lb 9.6 oz (105.1 kg)  06/01/23 227 lb 1.2 oz (103 kg)  05/26/23 226 lb 12.8 oz (102.9 kg)     Physical Exam Constitutional:      General: He is not in acute distress.    Appearance: Normal appearance.  HENT:     Head: Normocephalic and atraumatic.     Nose: Nose normal.     Mouth/Throat:     Mouth: Mucous membranes are moist.  Cardiovascular:     Rate and Rhythm: Normal rate and regular rhythm.     Heart sounds: Normal heart sounds. No murmur heard.    No gallop.  Pulmonary:     Effort: Pulmonary effort is normal. No respiratory distress.     Breath sounds:  Normal breath sounds. No wheezing, rhonchi or rales.  Skin:    General: Skin is warm and dry.  Neurological:     Mental Status: He is alert and oriented to person, place, and time.       03/31/2023   12:20 PM 10/20/2022    8:54 AM 09/27/2022   11:10 AM  Depression screen PHQ 2/9  Decreased Interest 0 0 0  Down, Depressed, Hopeless 0 0 0  PHQ - 2 Score 0 0 0  Altered sleeping 1 0 0  Tired, decreased energy 0 0 0  Change in appetite 0 0 0  Feeling bad or failure about yourself  0 0 0  Trouble concentrating 0 0 0  Moving slowly or fidgety/restless 0 0 0  Suicidal thoughts 0 0 0  PHQ-9 Score 1 0 0      03/31/2023   12:21 PM 06/10/2022   11:20 AM  GAD 7 : Generalized Anxiety Score  Nervous, Anxious, on Edge 1 3  Control/stop worrying 2 3  Worry too much - different things 1 2  Trouble relaxing 0 1  Restless 1 1  Easily annoyed or irritable 2 1  Afraid - awful might happen 0 2  Total GAD 7 Score 7 13    Results for orders placed or performed in visit on 11/20/23  POCT urinalysis dipstick  Result Value Ref Range   Color, UA yellow    Clarity, UA clear    Glucose, UA Negative Negative   Bilirubin, UA neg    Ketones, UA neg    Spec Grav, UA 1.020 1.010 - 1.025   Blood, UA 10+    pH, UA 5.5 5.0 - 8.0   Protein, UA Negative Negative   Urobilinogen, UA 0.2 0.2 or 1.0 E.U./dL   Nitrite, UA neg    Leukocytes, UA Negative Negative   Appearance     Odor        Assessment & Plan:  Benign prostatic hyperplasia with post-void dribbling -     POCT urinalysis dipstick -     PSA -     Basic metabolic panel -     CBC with Differential/Platelet -     Tamsulosin HCl; Take 1 capsule (0.4 mg total) by mouth daily.  Dispense: 30 capsule; Refill: 1  Other microscopic hematuria -     Urinalysis, Routine w reflex microscopic  Patient with postvoid dribbling likely due to BPH.  POC UA with 10+ RBCs.  Obtain UA routine with microscopic.  Obtain labs including PSA.  Start Flomax.   Further recommendations based on results.  For continued or worsening symptoms urology referral.  Return in about 6 weeks (around 01/01/2024), or if symptoms worsen or fail to improve.   Deeann Saint, MD

## 2023-11-21 ENCOUNTER — Encounter: Payer: Self-pay | Admitting: Family Medicine

## 2023-11-21 LAB — CBC WITH DIFFERENTIAL/PLATELET
Basophils Absolute: 0 10*3/uL (ref 0.0–0.1)
Basophils Relative: 0.9 % (ref 0.0–3.0)
Eosinophils Absolute: 0.1 10*3/uL (ref 0.0–0.7)
Eosinophils Relative: 2.5 % (ref 0.0–5.0)
HCT: 43.3 % (ref 39.0–52.0)
Hemoglobin: 14.6 g/dL (ref 13.0–17.0)
Lymphocytes Relative: 33.6 % (ref 12.0–46.0)
Lymphs Abs: 1.9 10*3/uL (ref 0.7–4.0)
MCHC: 33.8 g/dL (ref 30.0–36.0)
MCV: 86.2 fL (ref 78.0–100.0)
Monocytes Absolute: 0.5 10*3/uL (ref 0.1–1.0)
Monocytes Relative: 9.2 % (ref 3.0–12.0)
Neutro Abs: 3 10*3/uL (ref 1.4–7.7)
Neutrophils Relative %: 53.8 % (ref 43.0–77.0)
Platelets: 207 10*3/uL (ref 150.0–400.0)
RBC: 5.02 Mil/uL (ref 4.22–5.81)
RDW: 13.2 % (ref 11.5–15.5)
WBC: 5.6 10*3/uL (ref 4.0–10.5)

## 2023-11-21 LAB — BASIC METABOLIC PANEL
BUN: 22 mg/dL (ref 6–23)
CO2: 30 meq/L (ref 19–32)
Calcium: 9.3 mg/dL (ref 8.4–10.5)
Chloride: 105 meq/L (ref 96–112)
Creatinine, Ser: 1.21 mg/dL (ref 0.40–1.50)
GFR: 67.42 mL/min (ref >60.00)
Glucose, Bld: 90 mg/dL (ref 70–99)
Potassium: 4.8 meq/L (ref 3.5–5.1)
Sodium: 142 meq/L (ref 135–145)

## 2023-11-21 LAB — PSA: PSA: 1.8 ng/mL (ref 0.10–4.00)

## 2023-12-04 ENCOUNTER — Telehealth: Payer: Self-pay | Admitting: Family Medicine

## 2023-12-04 NOTE — Telephone Encounter (Signed)
Patient requests TOC from Zandra Abts to [facility].  Please advise.

## 2023-12-04 NOTE — Telephone Encounter (Signed)
I called pt and informed him of Alyssa's decision. Patient overall was okay with this information and explained he does not fault Alyssa on her honest decision. 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 Please contact patient.

## 2023-12-05 ENCOUNTER — Encounter: Payer: Self-pay | Admitting: Family Medicine

## 2023-12-07 ENCOUNTER — Ambulatory Visit: Payer: 59 | Admitting: Physician Assistant

## 2024-01-01 ENCOUNTER — Ambulatory Visit: Payer: 59 | Admitting: Family Medicine

## 2024-01-18 ENCOUNTER — Other Ambulatory Visit: Payer: Self-pay | Admitting: Family Medicine

## 2024-01-18 DIAGNOSIS — N3943 Post-void dribbling: Secondary | ICD-10-CM

## 2024-01-31 ENCOUNTER — Encounter: Payer: Self-pay | Admitting: Internal Medicine

## 2024-02-14 ENCOUNTER — Other Ambulatory Visit: Payer: Self-pay | Admitting: Family Medicine

## 2024-02-14 DIAGNOSIS — G2581 Restless legs syndrome: Secondary | ICD-10-CM

## 2024-04-09 ENCOUNTER — Other Ambulatory Visit: Payer: Self-pay | Admitting: Family Medicine

## 2024-04-09 DIAGNOSIS — F411 Generalized anxiety disorder: Secondary | ICD-10-CM

## 2024-06-24 ENCOUNTER — Other Ambulatory Visit: Payer: Self-pay | Admitting: Family Medicine

## 2024-06-24 DIAGNOSIS — E785 Hyperlipidemia, unspecified: Secondary | ICD-10-CM

## 2024-08-22 LAB — HM COLONOSCOPY

## 2024-09-05 ENCOUNTER — Other Ambulatory Visit: Payer: Self-pay | Admitting: Family Medicine

## 2024-09-05 DIAGNOSIS — R002 Palpitations: Secondary | ICD-10-CM

## 2024-09-23 ENCOUNTER — Other Ambulatory Visit: Payer: Self-pay | Admitting: Family Medicine

## 2024-09-23 DIAGNOSIS — E785 Hyperlipidemia, unspecified: Secondary | ICD-10-CM

## 2024-10-07 ENCOUNTER — Other Ambulatory Visit: Payer: Self-pay | Admitting: Family Medicine

## 2024-10-07 DIAGNOSIS — R002 Palpitations: Secondary | ICD-10-CM
